# Patient Record
Sex: Female | Born: 1950 | Race: White | Hispanic: No | Marital: Married | Smoking: Never smoker
Health system: Southern US, Community
[De-identification: ages and names within clinical notes are randomized; demographics above are authoritative.]

## PROBLEM LIST (undated history)

## (undated) DIAGNOSIS — C801 Malignant (primary) neoplasm, unspecified: Secondary | ICD-10-CM

## (undated) DIAGNOSIS — T4145XA Adverse effect of unspecified anesthetic, initial encounter: Secondary | ICD-10-CM

## (undated) DIAGNOSIS — F419 Anxiety disorder, unspecified: Secondary | ICD-10-CM

## (undated) DIAGNOSIS — I499 Cardiac arrhythmia, unspecified: Secondary | ICD-10-CM

## (undated) DIAGNOSIS — T8859XA Other complications of anesthesia, initial encounter: Secondary | ICD-10-CM

## (undated) DIAGNOSIS — J189 Pneumonia, unspecified organism: Secondary | ICD-10-CM

## (undated) DIAGNOSIS — Z87442 Personal history of urinary calculi: Secondary | ICD-10-CM

## (undated) DIAGNOSIS — J45909 Unspecified asthma, uncomplicated: Secondary | ICD-10-CM

## (undated) DIAGNOSIS — Z803 Family history of malignant neoplasm of breast: Secondary | ICD-10-CM

## (undated) HISTORY — PX: COLONOSCOPY: SHX174

## (undated) HISTORY — PX: ASD REPAIR, SINUS VENOSUS: SHX1196

## (undated) HISTORY — PX: TUBAL LIGATION: SHX77

## (undated) HISTORY — DX: Family history of malignant neoplasm of breast: Z80.3

## (undated) HISTORY — PX: TONSILLECTOMY: SUR1361

## (undated) HISTORY — PX: TRANSTHORACIC ECHOCARDIOGRAM: SHX275

## (undated) SURGERY — ERCP, WITH INTERVENTION IF INDICATED
Anesthesia: General

---
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
## 2019-11-16 DIAGNOSIS — C801 Malignant (primary) neoplasm, unspecified: Secondary | ICD-10-CM

## 2019-11-16 HISTORY — DX: Malignant (primary) neoplasm, unspecified: C80.1

## 2019-11-30 ENCOUNTER — Encounter: Payer: Self-pay | Admitting: Oncology

## 2019-12-03 ENCOUNTER — Telehealth: Payer: Self-pay | Admitting: Gastroenterology

## 2019-12-03 NOTE — Telephone Encounter (Signed)
Dr Ardis Hughs the records will be put on your desk for review.

## 2019-12-03 NOTE — Telephone Encounter (Signed)
Mary [name] from [facility] called so we can schedule this patient as soon as possible for EUS patient has pancreatic mass records have been received and will be sent to you for review.

## 2019-12-04 NOTE — Telephone Encounter (Signed)
I reviewed a large packet of information from a [place] provider.  Patient has had abdominal pains.  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  CBC was essentially normal, LFTs were not sent, CA 19-9 was extremely elevated at 1349 (usually indicates metastatic process or significant jaundice or both).  Please contact her referring provider and recommend that she have LFTs checked today, ASAP.  I suspect she is going to be jaundiced with elevated liver tests.  Certainly admitting her to the hospital to expedite her care if she is significantly jaundiced is reasonable, I will leave that to her primary team in [place] to decide. That could be done locally I suspect.    Please offer the patient urgent appointment here in our office with any available provider early next week.  From that appointment she can be triaged for ERCP +/- EUS at same time or different time pending availability.  I will leave the large packet of information on my desk.   She will almost certainly need biliary decompression, biliary brushing +/- EUS pending the results of the biliary brushing and availability of EUS.  I am not able to help her for 3 weeks from now.

## 2019-12-04 NOTE — Telephone Encounter (Signed)
I spoke with Cyril Mourning at [facility] and she will have the pt come in for labs today ASAP.  She has been scheduled to see Dr Ardis Hughs on 3/23.  The pt has been advised of the appt information.

## 2019-12-04 NOTE — Telephone Encounter (Signed)
I spoke with the pt and she has been scheduled for 3/23 with Dr Ardis Hughs.  I have tried to call [place] at [phone] and the office is currently closed for lunch.

## 2019-12-07 ENCOUNTER — Other Ambulatory Visit: Payer: Self-pay

## 2019-12-07 DIAGNOSIS — R748 Abnormal levels of other serum enzymes: Secondary | ICD-10-CM

## 2019-12-07 DIAGNOSIS — K8689 Other specified diseases of pancreas: Secondary | ICD-10-CM | POA: Insufficient documentation

## 2019-12-07 DIAGNOSIS — K838 Other specified diseases of biliary tract: Secondary | ICD-10-CM

## 2019-12-07 DIAGNOSIS — E039 Hypothyroidism, unspecified: Secondary | ICD-10-CM

## 2019-12-07 DIAGNOSIS — R140 Abdominal distension (gaseous): Secondary | ICD-10-CM

## 2019-12-07 DIAGNOSIS — Q2116 Sinus venosus atrial septal defect, unspecified: Secondary | ICD-10-CM

## 2019-12-07 DIAGNOSIS — I10 Essential (primary) hypertension: Secondary | ICD-10-CM | POA: Insufficient documentation

## 2019-12-07 DIAGNOSIS — R110 Nausea: Secondary | ICD-10-CM

## 2019-12-07 DIAGNOSIS — Q211 Atrial septal defect: Secondary | ICD-10-CM

## 2019-12-07 DIAGNOSIS — E7800 Pure hypercholesterolemia, unspecified: Secondary | ICD-10-CM

## 2019-12-07 DIAGNOSIS — R634 Abnormal weight loss: Secondary | ICD-10-CM

## 2019-12-07 DIAGNOSIS — R630 Anorexia: Secondary | ICD-10-CM

## 2019-12-07 HISTORY — DX: Hypothyroidism, unspecified: E03.9

## 2019-12-07 HISTORY — DX: Essential (primary) hypertension: I10

## 2019-12-07 HISTORY — DX: Nausea: R11.0

## 2019-12-07 HISTORY — DX: Other specified diseases of biliary tract: K83.8

## 2019-12-07 HISTORY — DX: Atrial septal defect: Q21.1

## 2019-12-07 HISTORY — DX: Other specified diseases of pancreas: K86.89

## 2019-12-07 HISTORY — DX: Pure hypercholesterolemia, unspecified: E78.00

## 2019-12-07 HISTORY — DX: Abnormal weight loss: R63.4

## 2019-12-07 HISTORY — DX: Anorexia: R63.0

## 2019-12-07 HISTORY — DX: Abdominal distension (gaseous): R14.0

## 2019-12-07 HISTORY — DX: Abnormal levels of other serum enzymes: R74.8

## 2019-12-07 HISTORY — DX: Sinus venosus atrial septal defect, unspecified: Q21.16

## 2019-12-08 ENCOUNTER — Encounter: Payer: Self-pay | Admitting: Gastroenterology

## 2019-12-08 ENCOUNTER — Ambulatory Visit (INDEPENDENT_AMBULATORY_CARE_PROVIDER_SITE_OTHER): Payer: Medicare Other | Admitting: Gastroenterology

## 2019-12-08 VITALS — BP 110/70 | HR 105 | Temp 98.3°F | Ht 61.0 in | Wt 125.0 lb

## 2019-12-08 DIAGNOSIS — K838 Other specified diseases of biliary tract: Secondary | ICD-10-CM | POA: Diagnosis not present

## 2019-12-08 DIAGNOSIS — K8689 Other specified diseases of pancreas: Secondary | ICD-10-CM | POA: Diagnosis not present

## 2019-12-08 DIAGNOSIS — R748 Abnormal levels of other serum enzymes: Secondary | ICD-10-CM | POA: Diagnosis not present

## 2019-12-08 NOTE — H&P (View-Only) (Signed)
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  She is here with her husband today.  She had some mild constipation and abdominal discomfort and presented for evaluation her primary care physician.  This eventually led to imaging tests and blood tests which are all summarized below.  Before this she has never been told that she had pancreatic problems.  She has noticed that her stools have gotten a little lighter in her urine has gotten little darker.  She has lost about 10 pounds in the past 2 months.  She really has anorexia, no real pains when she eats however.  She is had no fevers or chills  Old Data Reviewed:  Blood work November 30, 2019 shows creatinine 1.3, BUN 27, CA 19-9 1349, white blood cell count 9, hemoglobin 14.5, platelets 344, lipase 562, amylase normal, celiac sprue serologies were all normal.  TSH was normal (thank you Blood work December 04, 2019 total bilirubin 3.1, direct 2.2, AST 538, ALT 527, alk phos 418  MRI with and without contrast of the abdomen December 02, 2018: Shows an 18 x 17 mm mass within the head of the pancreas that is hypoenhancing.  Pancreatic duct is dilated 5.4 mm.  Tail the pancreas is atrophic.,  Bile duct is dilated up to 11 mm no filling defects in the common bile duct, her gallbladder is essentially normal  CT scan oral contrast November 28, 2018 shows "fullness in the head of the pancreas" dilated main pancreatic duct up to 8 mm.     Review of systems: Pertinent positive and negative review of systems were noted in the above HPI section. All other review negative.   Past Medical History:  Diagnosis Date  . Abdominal distention 12/07/2019  . 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 Atrial septal defect, sinus venosus 12/07/2019  . Common bile duct dilation 12/07/2019  . Dilated pancreatic duct 12/07/2019  . Elevated lipase 12/07/2019   . Essential hypertension 12/07/2019  . Hypothyroidism 12/07/2019  . Kidney stones   . Loss of weight 12/07/2019  . Nausea 12/07/2019  . Pancreatic mass 12/07/2019  . Pure hypercholesterolemia 12/07/2019    Past Surgical History:  Procedure Laterality Date  . ASD REPAIR, SINUS VENOSUS    . TONSILLECTOMY    . TUBAL LIGATION      Current Outpatient Medications  Medication Sig Dispense Refill  . acetaminophen (TYLENOL) 500 MG tablet Take 500 mg by mouth every 6 (six) hours as needed.    Marland Kitchen albuterol (VENTOLIN HFA) 108 (90 Base) MCG/ACT inhaler SMARTSIG:2 Puff(s) By Mouth Every 4 Hours PRN    . aspirin EC 81 MG tablet Take 81 mg by mouth daily.    Marland Kitchen atorvastatin (LIPITOR) 40 MG tablet Take 40 mg by mouth daily.    Marland Kitchen losartan (COZAAR) 50 MG tablet Take 50 mg by mouth daily.    . Omega-3 Fatty Acids (FISH OIL) 1000 MG CAPS Take 1 capsule by mouth daily.     No current facility-administered medications for this visit.    Allergies as of 12/08/2019 - Review Complete 12/08/2019  Allergen Reaction Noted  . Ultram [tramadol] Nausea And Vomiting 12/07/2019  . Penicillins Rash 12/07/2019  . Sulfa antibiotics Rash 12/07/2019    Family History  Problem Relation Age of Onset  . COPD Mother   . Dementia Father   . Diabetes Father   . Hypertension  Father   . Alzheimer's disease Brother     Social History   Socioeconomic History  . Marital status: Married    Spouse name: Not on file  . Number of children: Not on file  . Years of education: Not on file  . Highest education level: Not on file  Occupational History  . Not on file  Tobacco Use  . Smoking status: Never Smoker  . Smokeless tobacco: Never Used  Substance and Sexual Activity  . Alcohol use: Never  . Drug use: Never  . Sexual activity: Not on file  Other Topics Concern  . Not on file  Social History Narrative  . Not on file   Social Determinants of Health   Financial Resource Strain:   . Difficulty of Paying Living  Expenses:   Food Insecurity:   . Worried About Charity fundraiser in the Last Year:   . Arboriculturist in the Last Year:   Transportation Needs:   . Film/video editor (Medical):   Marland Kitchen Lack of Transportation (Non-Medical):   Physical Activity:   . Days of Exercise per Week:   . Minutes of Exercise per Session:   Stress:   . Feeling of Stress :   Social Connections:   . Frequency of Communication with Friends and Family:   . Frequency of Social Gatherings with Friends and Family:   . Attends Religious Services:   . Active Member of Clubs or Organizations:   . Attends Archivist Meetings:   Marland Kitchen Marital Status:   Intimate Partner Violence:   . Fear of Current or Ex-Partner:   . Emotionally Abused:   Marland Kitchen Physically Abused:   . Sexually Abused:      Physical Exam: BP 110/70   Pulse (!) 105   Temp 98.3 F (36.8 C)   Ht 5' 1" (1.549 m)   Wt 125 lb (56.7 kg)   BMI 23.62 kg/m  Constitutional: generally well-appearing Psychiatric: alert and oriented x3 Eyes: extraocular movements intact Mouth: oral pharynx moist, no lesions Neck: supple no lymphadenopathy Cardiovascular: heart regular rate and rhythm Lungs: clear to auscultation bilaterally Abdomen: soft, nontender, nondistended, no obvious ascites, no peritoneal signs, normal bowel sounds Extremities: no lower extremity edema bilaterally Skin: no lesions on visible extremities   Assessment and plan: 69 y.o. 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  They understand that she has a growth in the head of her pancreas that is possibly cancer.  Her total bilirubin was 3.3 and her CA 19-9 was almost 2000 and so I have a suspicion that this might be unresectable.  I recommended endoscopic ultrasound and same time ERCP with her to be done later this week.  She understands her risks to both procedures including  perforation, pancreatitis and bleeding.  We are going to start referral process to medical oncology for pancreatic mass.  I am going to hold on surgery evaluation until we have a better idea about resectability.  She is going to hand carry her CT and MRI on disc with her at the time of her visit later this week.  She understands that she might need further imaging here locally before final staging determination can be done.   Please see the "Patient Instructions" section for addition details about the plan.   Owens Loffler, MD [facility] 12/08/2019, 3:06 PM  Cc: Linus Galas,  NP, [facility] in [place]  Total time on date of encounter was 60  minutes (this included time spent preparing to see the patient reviewing records; obtaining and/or reviewing separately obtained history; performing a medically appropriate exam and/or evaluation; counseling and educating the patient and family if present; ordering medications, tests or procedures if applicable; and documenting clinical information in the health record).

## 2019-12-08 NOTE — Patient Instructions (Addendum)
If you are age 69 or older, your body mass index should be between 23-30. Your Body mass index is 23.62 kg/m. If this is out of the aforementioned range listed, please consider follow up with your Primary Care Provider.  If you are age 13 or younger, your body mass index should be between 19-25. Your Body mass index is 23.62 kg/m. If this is out of the aformentioned range listed, please consider follow up with your Primary Care Provider.   You have been scheduled for an ERCP and upper EUS. Please follow written instructions given to you at your visit today. If you use inhalers (even only as needed), please bring them with you on the day of your procedure.   You will have lab work on the morning of your procedure.  You do not have to fast.  Due to recent changes in healthcare laws, you may see the results of your imaging and laboratory studies on MyChart before your provider has had a chance to review them.  We understand that in some cases there may be results that are confusing or concerning to you. Not all laboratory results come back in the same time frame and the provider may be waiting for multiple results in order to interpret others.  Please give [place] 48 hours in order for your provider to thoroughly review all the results before contacting the office for clarification of your results.   A referral to Oncology has been made. You should expect a call from their office regarding an appointment.   Please bring MRI and CT results on a disk to procedure on Friday.  Thank you for entrusting me with your care and choosing [facility].  Dr Ardis Hughs

## 2019-12-08 NOTE — Progress Notes (Signed)
HPI: This is a Beola Cord, Designer, jewellery at [facility] in [place]  Very pleasant 69 year old woman whom I am meeting for the first time today.  She is here with her husband today.  She had some mild constipation and abdominal discomfort and presented for evaluation her primary care physician.  This eventually led to imaging tests and blood tests which are all summarized below.  Before this she has never been told that she had pancreatic problems.  She has noticed that her stools have gotten a little lighter in her urine has gotten little darker.  She has lost about 10 pounds in the past 2 months.  She really has anorexia, no real pains when she eats however.  She is had no fevers or chills  Old Data Reviewed:  Blood work November 30, 2019 shows creatinine 1.3, BUN 27, CA 19-9 1349, white blood cell count 9, hemoglobin 14.5, platelets 344, lipase 562, amylase normal, celiac sprue serologies were all normal.  TSH was normal (thank you Blood work December 04, 2019 total bilirubin 3.1, direct 2.2, AST 538, ALT 527, alk phos 418  MRI with and without contrast of the abdomen December 02, 2018: Shows an 18 x 17 mm mass within the head of the pancreas that is hypoenhancing.  Pancreatic duct is dilated 5.4 mm.  Tail the pancreas is atrophic.,  Bile duct is dilated up to 11 mm no filling defects in the common bile duct, her gallbladder is essentially normal  CT scan oral contrast November 28, 2018 shows "fullness in the head of the pancreas" dilated main pancreatic duct up to 8 mm.     Review of systems: Pertinent positive and negative review of systems were noted in the above HPI section. All other review negative.   Past Medical History:  Diagnosis Date  . Abdominal distention 12/07/2019  . 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 Atrial septal defect, sinus venosus 12/07/2019  . Common bile duct dilation 12/07/2019  . Dilated pancreatic duct 12/07/2019  . Elevated lipase 12/07/2019   . Essential hypertension 12/07/2019  . Hypothyroidism 12/07/2019  . Kidney stones   . Loss of weight 12/07/2019  . Nausea 12/07/2019  . Pancreatic mass 12/07/2019  . Pure hypercholesterolemia 12/07/2019    Past Surgical History:  Procedure Laterality Date  . ASD REPAIR, SINUS VENOSUS    . TONSILLECTOMY    . TUBAL LIGATION      Current Outpatient Medications  Medication Sig Dispense Refill  . acetaminophen (TYLENOL) 500 MG tablet Take 500 mg by mouth every 6 (six) hours as needed.    Marland Kitchen albuterol (VENTOLIN HFA) 108 (90 Base) MCG/ACT inhaler SMARTSIG:2 Puff(s) By Mouth Every 4 Hours PRN    . aspirin EC 81 MG tablet Take 81 mg by mouth daily.    Marland Kitchen atorvastatin (LIPITOR) 40 MG tablet Take 40 mg by mouth daily.    Marland Kitchen losartan (COZAAR) 50 MG tablet Take 50 mg by mouth daily.    . Omega-3 Fatty Acids (FISH OIL) 1000 MG CAPS Take 1 capsule by mouth daily.     No current facility-administered medications for this visit.    Allergies as of 12/08/2019 - Review Complete 12/08/2019  Allergen Reaction Noted  . Ultram [tramadol] Nausea And Vomiting 12/07/2019  . Penicillins Rash 12/07/2019  . Sulfa antibiotics Rash 12/07/2019    Family History  Problem Relation Age of Onset  . COPD Mother   . Dementia Father   . Diabetes Father   . Hypertension  Father   . Alzheimer's disease Brother     Social History   Socioeconomic History  . Marital status: Married    Spouse name: Not on file  . Number of children: Not on file  . Years of education: Not on file  . Highest education level: Not on file  Occupational History  . Not on file  Tobacco Use  . Smoking status: Never Smoker  . Smokeless tobacco: Never Used  Substance and Sexual Activity  . Alcohol use: Never  . Drug use: Never  . Sexual activity: Not on file  Other Topics Concern  . Not on file  Social History Narrative  . Not on file   Social Determinants of Health   Financial Resource Strain:   . Difficulty of Paying Living  Expenses:   Food Insecurity:   . Worried About Charity fundraiser in the Last Year:   . Arboriculturist in the Last Year:   Transportation Needs:   . Film/video editor (Medical):   Marland Kitchen Lack of Transportation (Non-Medical):   Physical Activity:   . Days of Exercise per Week:   . Minutes of Exercise per Session:   Stress:   . Feeling of Stress :   Social Connections:   . Frequency of Communication with Friends and Family:   . Frequency of Social Gatherings with Friends and Family:   . Attends Religious Services:   . Active Member of Clubs or Organizations:   . Attends Archivist Meetings:   Marland Kitchen Marital Status:   Intimate Partner Violence:   . Fear of Current or Ex-Partner:   . Emotionally Abused:   Marland Kitchen Physically Abused:   . Sexually Abused:      Physical Exam: BP 110/70   Pulse (!) 105   Temp 98.3 F (36.8 C)   Ht 5' 1" (1.549 m)   Wt 125 lb (56.7 kg)   BMI 23.62 kg/m  Constitutional: generally well-appearing Psychiatric: alert and oriented x3 Eyes: extraocular movements intact Mouth: oral pharynx moist, no lesions Neck: supple no lymphadenopathy Cardiovascular: heart regular rate and rhythm Lungs: clear to auscultation bilaterally Abdomen: soft, nontender, nondistended, no obvious ascites, no peritoneal signs, normal bowel sounds Extremities: no lower extremity edema bilaterally Skin: no lesions on visible extremities   Assessment and plan: 69 y.o. 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  They understand that she has a growth in the head of her pancreas that is possibly cancer.  Her total bilirubin was 3.3 and her CA 19-9 was almost 2000 and so I have a suspicion that this might be unresectable.  I recommended endoscopic ultrasound and same time ERCP with her to be done later this week.  She understands her risks to both procedures including  perforation, pancreatitis and bleeding.  We are going to start referral process to medical oncology for pancreatic mass.  I am going to hold on surgery evaluation until we have a better idea about resectability.  She is going to hand carry her CT and MRI on disc with her at the time of her visit later this week.  She understands that she might need further imaging here locally before final staging determination can be done.   Please see the "Patient Instructions" section for addition details about the plan.   Owens Loffler, MD [facility] 12/08/2019, 3:06 PM  Cc: Linus Galas,  NP, [facility] in [place]  Total time on date of encounter was 60  minutes (this included time spent preparing to see the patient reviewing records; obtaining and/or reviewing separately obtained history; performing a medically appropriate exam and/or evaluation; counseling and educating the patient and family if present; ordering medications, tests or procedures if applicable; and documenting clinical information in the health record).

## 2019-12-09 ENCOUNTER — Other Ambulatory Visit (HOSPITAL_COMMUNITY)
Admission: RE | Admit: 2019-12-09 | Discharge: 2019-12-09 | Disposition: A | Discharge status: Home / Self Care | Payer: Medicare Other | Source: Ambulatory Visit | Attending: Gastroenterology | Admitting: Gastroenterology

## 2019-12-09 DIAGNOSIS — K8689 Other specified diseases of pancreas: Secondary | ICD-10-CM | POA: Diagnosis present

## 2019-12-09 DIAGNOSIS — Z7982 Long term (current) use of aspirin: Secondary | ICD-10-CM | POA: Diagnosis not present

## 2019-12-09 DIAGNOSIS — C250 Malignant neoplasm of head of pancreas: Secondary | ICD-10-CM | POA: Diagnosis not present

## 2019-12-09 DIAGNOSIS — E7800 Pure hypercholesterolemia, unspecified: Secondary | ICD-10-CM | POA: Diagnosis not present

## 2019-12-09 DIAGNOSIS — I10 Essential (primary) hypertension: Secondary | ICD-10-CM | POA: Diagnosis not present

## 2019-12-09 DIAGNOSIS — Z20822 Contact with and (suspected) exposure to covid-19: Secondary | ICD-10-CM | POA: Diagnosis not present

## 2019-12-09 DIAGNOSIS — Z79899 Other long term (current) drug therapy: Secondary | ICD-10-CM | POA: Diagnosis not present

## 2019-12-09 DIAGNOSIS — K831 Obstruction of bile duct: Secondary | ICD-10-CM | POA: Diagnosis not present

## 2019-12-09 LAB — SARS CORONAVIRUS 2 (TAT 6-24 HRS): SARS Coronavirus 2: NEGATIVE

## 2019-12-10 ENCOUNTER — Encounter (HOSPITAL_COMMUNITY): Payer: Self-pay | Admitting: Gastroenterology

## 2019-12-10 ENCOUNTER — Other Ambulatory Visit: Payer: Self-pay

## 2019-12-10 ENCOUNTER — Telehealth: Payer: Self-pay | Admitting: Nurse Practitioner

## 2019-12-10 NOTE — Progress Notes (Signed)
Spoke with pt for pre-op call. Pt has hx of ASD with surgery done when pt was 69 years old. Pt has recently seen Dr. Raechel Chute at [facility] in [place] for palpitations. She states he has done an EKG and Echo. I have requested those 2 items to be faxed to [place]. Pt denies any recent chest pain.  Covid test done on 12/09/19 and it's negative. Pt states she's been in quarantine since the test was done and understands that she needs to continue to be in quarantine until she comes to the hospital tomorrow.  Asked pt to hold her Melatonin tonight. She states Dr. Edison Nasuti told her that she could continue her 81 mg Aspirin.

## 2019-12-10 NOTE — Telephone Encounter (Signed)
Received a new pt referral from Dr. Ardis Hughs at [facility] for pancreatic mass. Mary [name] has been cld and schedule to see Mary [name] and Mary [name] on 3/30 at 11:45am. Pt aware to arrive 15 minutes early.

## 2019-12-11 ENCOUNTER — Encounter (HOSPITAL_COMMUNITY)
Admission: RE | Disposition: A | Discharge status: Home / Self Care | Payer: Self-pay | Source: Home / Self Care | Attending: Gastroenterology

## 2019-12-11 ENCOUNTER — Ambulatory Visit (HOSPITAL_COMMUNITY): Payer: Medicare Other

## 2019-12-11 ENCOUNTER — Ambulatory Visit (HOSPITAL_COMMUNITY)
Admission: RE | Admit: 2019-12-11 | Discharge: 2019-12-11 | Disposition: A | Discharge status: Home / Self Care | Payer: Medicare Other | Attending: Gastroenterology | Admitting: Gastroenterology

## 2019-12-11 ENCOUNTER — Ambulatory Visit: Payer: Self-pay

## 2019-12-11 ENCOUNTER — Other Ambulatory Visit: Payer: Self-pay

## 2019-12-11 ENCOUNTER — Encounter (HOSPITAL_COMMUNITY): Payer: Self-pay | Admitting: Gastroenterology

## 2019-12-11 ENCOUNTER — Ambulatory Visit (HOSPITAL_COMMUNITY): Payer: Medicare Other | Admitting: Certified Registered Nurse Anesthetist

## 2019-12-11 DIAGNOSIS — Z79899 Other long term (current) drug therapy: Secondary | ICD-10-CM | POA: Insufficient documentation

## 2019-12-11 DIAGNOSIS — K8689 Other specified diseases of pancreas: Secondary | ICD-10-CM | POA: Diagnosis not present

## 2019-12-11 DIAGNOSIS — K838 Other specified diseases of biliary tract: Secondary | ICD-10-CM

## 2019-12-11 DIAGNOSIS — C250 Malignant neoplasm of head of pancreas: Secondary | ICD-10-CM | POA: Diagnosis not present

## 2019-12-11 DIAGNOSIS — R748 Abnormal levels of other serum enzymes: Secondary | ICD-10-CM

## 2019-12-11 DIAGNOSIS — Z7982 Long term (current) use of aspirin: Secondary | ICD-10-CM | POA: Insufficient documentation

## 2019-12-11 DIAGNOSIS — R52 Pain, unspecified: Secondary | ICD-10-CM

## 2019-12-11 DIAGNOSIS — K831 Obstruction of bile duct: Secondary | ICD-10-CM | POA: Insufficient documentation

## 2019-12-11 DIAGNOSIS — Z20822 Contact with and (suspected) exposure to covid-19: Secondary | ICD-10-CM | POA: Insufficient documentation

## 2019-12-11 DIAGNOSIS — I10 Essential (primary) hypertension: Secondary | ICD-10-CM | POA: Diagnosis not present

## 2019-12-11 DIAGNOSIS — E7800 Pure hypercholesterolemia, unspecified: Secondary | ICD-10-CM | POA: Insufficient documentation

## 2019-12-11 HISTORY — PX: ESOPHAGOGASTRODUODENOSCOPY (EGD) WITH PROPOFOL: SHX5813

## 2019-12-11 HISTORY — DX: Pneumonia, unspecified organism: J18.9

## 2019-12-11 HISTORY — DX: Cardiac arrhythmia, unspecified: I49.9

## 2019-12-11 HISTORY — PX: BILIARY STENT PLACEMENT: SHX5538

## 2019-12-11 HISTORY — PX: UPPER ESOPHAGEAL ENDOSCOPIC ULTRASOUND (EUS): SHX6562

## 2019-12-11 HISTORY — DX: Unspecified asthma, uncomplicated: J45.909

## 2019-12-11 HISTORY — DX: Personal history of urinary calculi: Z87.442

## 2019-12-11 HISTORY — PX: FINE NEEDLE ASPIRATION: SHX5430

## 2019-12-11 HISTORY — PX: ENDOSCOPIC RETROGRADE CHOLANGIOPANCREATOGRAPHY (ERCP) WITH PROPOFOL: SHX5810

## 2019-12-11 HISTORY — DX: Other complications of anesthesia, initial encounter: T88.59XA

## 2019-12-11 LAB — COMPREHENSIVE METABOLIC PANEL
ALT: 540 U/L — ABNORMAL HIGH (ref 0–44)
AST: 622 U/L — ABNORMAL HIGH (ref 15–41)
Albumin: 3.7 g/dL (ref 3.5–5.0)
Alkaline Phosphatase: 540 U/L — ABNORMAL HIGH (ref 38–126)
Anion gap: 11 (ref 5–15)
BUN: 23 mg/dL (ref 8–23)
CO2: 24 mmol/L (ref 22–32)
Calcium: 9.8 mg/dL (ref 8.9–10.3)
Chloride: 107 mmol/L (ref 98–111)
Creatinine, Ser: 1.77 mg/dL — ABNORMAL HIGH (ref 0.44–1.00)
GFR calc Af Amer: 34 mL/min — ABNORMAL LOW (ref >60)
GFR calc non Af Amer: 29 mL/min — ABNORMAL LOW (ref >60)
Glucose, Bld: 125 mg/dL — ABNORMAL HIGH (ref 70–99)
Potassium: 4.6 mmol/L (ref 3.5–5.1)
Sodium: 142 mmol/L (ref 135–145)
Total Bilirubin: 6.4 mg/dL — ABNORMAL HIGH (ref 0.3–1.2)
Total Protein: 7.3 g/dL (ref 6.5–8.1)

## 2019-12-11 SURGERY — ENDOSCOPIC RETROGRADE CHOLANGIOPANCREATOGRAPHY (ERCP) WITH PROPOFOL
Anesthesia: General

## 2019-12-11 MED ORDER — PROPOFOL 10 MG/ML IV BOLUS
INTRAVENOUS | Status: DC | PRN
  Administered 2019-12-11: 100 mg via INTRAVENOUS

## 2019-12-11 MED ORDER — SODIUM CHLORIDE 0.9 % IV SOLN: INTRAVENOUS | Status: DC

## 2019-12-11 MED ORDER — ONDANSETRON HCL 4 MG/2ML IJ SOLN
INTRAMUSCULAR | Status: DC | PRN
  Administered 2019-12-11: 4 mg via INTRAVENOUS

## 2019-12-11 MED ORDER — LIDOCAINE 2% (20 MG/ML) 5 ML SYRINGE
INTRAMUSCULAR | Status: DC | PRN
  Administered 2019-12-11: 100 mg via INTRAVENOUS

## 2019-12-11 MED ORDER — ROCURONIUM BROMIDE 10 MG/ML (PF) SYRINGE
PREFILLED_SYRINGE | INTRAVENOUS | Status: DC | PRN
  Administered 2019-12-11 (×2): 30 mg via INTRAVENOUS

## 2019-12-11 MED ORDER — FENTANYL CITRATE (PF) 100 MCG/2ML IJ SOLN
25.0000 ug | INTRAMUSCULAR | Status: DC | PRN
  Administered 2019-12-11: 13:00:00 25 ug via INTRAVENOUS

## 2019-12-11 MED ORDER — INDOMETHACIN 50 MG RE SUPP
RECTAL | Status: AC
  Filled 2019-12-11: qty 2

## 2019-12-11 MED ORDER — INDOMETHACIN 50 MG RE SUPP
RECTAL | Status: DC | PRN
  Administered 2019-12-11: 100 mg via RECTAL

## 2019-12-11 MED ORDER — LACTATED RINGERS IV SOLN: INTRAVENOUS | Status: DC

## 2019-12-11 MED ORDER — SODIUM CHLORIDE 0.9 % IV SOLN
INTRAVENOUS | Status: DC | PRN
  Administered 2019-12-11: 20 mL

## 2019-12-11 MED ORDER — CIPROFLOXACIN IN D5W 400 MG/200ML IV SOLN
INTRAVENOUS | Status: AC
  Filled 2019-12-11: qty 200

## 2019-12-11 MED ORDER — GLUCAGON HCL RDNA (DIAGNOSTIC) 1 MG IJ SOLR
INTRAMUSCULAR | Status: AC
  Filled 2019-12-11: qty 1

## 2019-12-11 MED ORDER — ONDANSETRON HCL 4 MG/2ML IJ SOLN: 4.0000 mg | Freq: Once | INTRAMUSCULAR | Status: DC | PRN

## 2019-12-11 MED ORDER — GLUCAGON HCL RDNA (DIAGNOSTIC) 1 MG IJ SOLR
INTRAMUSCULAR | Status: DC | PRN
  Administered 2019-12-11: .5 mg via INTRAVENOUS

## 2019-12-11 MED ORDER — PHENYLEPHRINE 40 MCG/ML (10ML) SYRINGE FOR IV PUSH (FOR BLOOD PRESSURE SUPPORT)
PREFILLED_SYRINGE | INTRAVENOUS | Status: DC | PRN
  Administered 2019-12-11 (×2): 160 ug via INTRAVENOUS
  Administered 2019-12-11 (×2): 120 ug via INTRAVENOUS

## 2019-12-11 MED ORDER — FENTANYL CITRATE (PF) 250 MCG/5ML IJ SOLN
INTRAMUSCULAR | Status: DC | PRN
  Administered 2019-12-11: 100 ug via INTRAVENOUS

## 2019-12-11 MED ORDER — DEXAMETHASONE SODIUM PHOSPHATE 10 MG/ML IJ SOLN
INTRAMUSCULAR | Status: DC | PRN
  Administered 2019-12-11: 4 mg via INTRAVENOUS

## 2019-12-11 MED ORDER — PHENYLEPHRINE HCL-NACL 10-0.9 MG/250ML-% IV SOLN
INTRAVENOUS | Status: DC | PRN
  Administered 2019-12-11: 25 ug/min via INTRAVENOUS

## 2019-12-11 MED ORDER — FENTANYL CITRATE (PF) 100 MCG/2ML IJ SOLN
INTRAMUSCULAR | Status: AC
  Filled 2019-12-11: qty 2

## 2019-12-11 MED ORDER — SUGAMMADEX SODIUM 200 MG/2ML IV SOLN
INTRAVENOUS | Status: DC | PRN
  Administered 2019-12-11: 180 mg via INTRAVENOUS

## 2019-12-11 MED ORDER — MEPERIDINE HCL 100 MG/ML IJ SOLN: 6.2500 mg | INTRAMUSCULAR | Status: DC | PRN

## 2019-12-11 SURGICAL SUPPLY — 14 items

## 2019-12-11 NOTE — Anesthesia Procedure Notes (Signed)
Procedure Name: Intubation Date/Time: 12/11/2019 9:56 AM Performed by: Bryson Corona, CRNA Pre-anesthesia Checklist: Patient identified, Emergency Drugs available, Suction available and Patient being monitored Patient Re-evaluated:Patient Re-evaluated prior to induction Oxygen Delivery Method: Circle System Utilized Preoxygenation: Pre-oxygenation with 100% oxygen Induction Type: IV induction Ventilation: Mask ventilation without difficulty Laryngoscope Size: Mac and 3 Grade View: Grade II Tube type: Oral Tube size: 7.0 mm Number of attempts: 1 Airway Equipment and Method: Stylet Placement Confirmation: ETT inserted through vocal cords under direct vision,  positive ETCO2 and breath sounds checked- equal and bilateral Secured at: 22 cm Tube secured with: Tape Dental Injury: Teeth and Oropharynx as per pre-operative assessment

## 2019-12-11 NOTE — Discharge Instructions (Signed)
YOU HAD AN ENDOSCOPIC PROCEDURE TODAY: Refer to the procedure report and other information in the discharge instructions given to you for any specific questions about what was found during the examination. If this information does not answer your questions, please call [place] office at [phone] to clarify.   YOU SHOULD EXPECT: Some feelings of bloating in the abdomen. Passage of more gas than usual. Walking can help get rid of the air that was put into your GI tract during the procedure and reduce the bloating. If you had a lower endoscopy (such as a colonoscopy or flexible sigmoidoscopy) you may notice spotting of blood in your stool or on the toilet paper. Some abdominal soreness may be present for a day or two, also.  DIET: Your first meal following the procedure should be a light meal and then it is ok to progress to your normal diet. A half-sandwich or bowl of soup is an example of a good first meal. Heavy or fried foods are harder to digest and may make you feel nauseous or bloated. Drink plenty of fluids but you should avoid alcoholic beverages for 24 hours. If you had a esophageal dilation, please see attached instructions for diet.    ACTIVITY: Your care partner should take you home directly after the procedure. You should plan to take it easy, moving slowly for the rest of the day. You can resume normal activity the day after the procedure however YOU SHOULD NOT DRIVE, use power tools, machinery or perform tasks that involve climbing or major physical exertion for 24 hours (because of the sedation medicines used during the test).   SYMPTOMS TO REPORT IMMEDIATELY: A gastroenterologist can be reached at any hour. Please call [phone]  for any of the following symptoms:   Following upper endoscopy (EGD, EUS, ERCP, esophageal dilation) Vomiting of blood or coffee ground material  New, significant abdominal pain  New, significant chest pain or pain under the shoulder blades  Painful or  persistently difficult swallowing  New shortness of breath  Black, tarry-looking or red, bloody stools  FOLLOW UP:  If any biopsies were taken you will be contacted by phone or by letter within the next 1-3 weeks. Call [phone]  if you have not heard about the biopsies in 3 weeks.  Please also call with any specific questions about appointments or follow up tests.  

## 2019-12-11 NOTE — Anesthesia Preprocedure Evaluation (Signed)
Anesthesia Evaluation  Patient identified by MRN, date of birth, ID band Patient awake    Reviewed: Allergy & Precautions, NPO status , Patient's Chart, lab work & pertinent test results  Airway Mallampati: II  TM Distance: >3 FB Neck ROM: Full    Dental no notable dental hx. (+) Dental Advisory Given   Pulmonary asthma , pneumonia,    Pulmonary exam normal breath sounds clear to auscultation       Cardiovascular hypertension, Pt. on medications Normal cardiovascular exam Rhythm:Regular Rate:Normal     Neuro/Psych negative neurological ROS  negative psych ROS   GI/Hepatic negative GI ROS, Neg liver ROS,   Endo/Other  Hypothyroidism   Renal/GU negative Renal ROS     Musculoskeletal negative musculoskeletal ROS (+)   Abdominal   Peds  Hematology negative hematology ROS (+)   Anesthesia Other Findings   Reproductive/Obstetrics negative OB ROS                             Anesthesia Physical Anesthesia Plan  ASA: III  Anesthesia Plan: General   Post-op Pain Management:    Induction: Intravenous  PONV Risk Score and Plan: 3 and Ondansetron, Dexamethasone and Treatment may vary due to age or medical condition  Airway Management Planned: Oral ETT  Additional Equipment: None  Intra-op Plan:   Post-operative Plan: Extubation in OR  Informed Consent: I have reviewed the patients History and Physical, chart, labs and discussed the procedure including the risks, benefits and alternatives for the proposed anesthesia with the patient or authorized representative who has indicated his/her understanding and acceptance.     Dental advisory given  Plan Discussed with: CRNA  Anesthesia Plan Comments:         Anesthesia Quick Evaluation

## 2019-12-11 NOTE — Op Note (Signed)
[facility] Patient Name: Mary [name] Procedure Date : 12/11/2019 MRN: [id number] Attending MD: [name] , MD Date of Birth: 10-Jul-1951 CSN: [id number] Age: 69 Admit Type: Inpatient Procedure:                Upper EUS Indications:              relatively painless jaundice, weight loss, outside                            [facility]) CT and MR show mass in head of pancreas,                            TBili 6.4 this morning Providers:                [name], MD, Elmer Ramp. Tilden Dome, RN, Lina Sar, Malachi Carl CRNA Referring MD:              Medicines:                General Anesthesia Complications:            No immediate complications. Estimated blood loss:                            None. Estimated Blood Loss:     Estimated blood loss: none. Procedure:                Pre-Anesthesia Assessment:                           - Prior to the procedure, a History and Physical                            was performed, and patient medications and                            allergies were reviewed. The patient's tolerance of                            previous anesthesia was also reviewed. The risks                            and benefits of the procedure and the sedation                            options and risks were discussed with the patient.                            All questions were answered, and informed consent                            was obtained. Prior Anticoagulants: The patient has  taken no previous anticoagulant or antiplatelet                            agents. ASA Grade Assessment: II - A patient with                            mild systemic disease. After reviewing the risks                            and benefits, the patient was deemed in                            satisfactory condition to undergo the procedure.                           After obtaining informed consent,  the endoscope was                            passed under direct vision. Throughout the                            procedure, the patient's blood pressure, pulse, and                            oxygen saturations were monitored continuously. The                            [id number]) Olympus Radial EUS scope was                            introduced through the mouth, and advanced to the                            second part of duodenum. The upper EUS was                            accomplished without difficulty. The patient                            tolerated the procedure well. Scope In: Scope Out: Findings:      ENDOSCOPIC FINDING (limited visualization with radial and linear EUS       scopes): :      The examined esophagus was endoscopically normal.      The entire examined stomach was endoscopically normal.      The examined duodenum was endoscopically normal.      ENDOSONOGRAPHIC FINDING: :      1. Irregularly shaped, hypoechoic, heterogeneous 3.4cm mass involving       the pancreatic head that has very irregular outer borders. The mass       clearly invades a 1.5cm segment of the portal vein and it is causing       pancreatic and bile duct obstruction with upstream dilation (main PD       dilated to 69mm in body and tail). The mass was sampled with three  transduodenal passes with a 25gauge EUS FNA needle.      2. No peripancreatic adenopathy.      3. The extrahepatic bile duct is dilated (CBD 1.3cm) and obstructed by       the pancreatic mass above.      4. Gallbladder was distended and contained sludge.      5. Limited views of the liver, spleen were all normal. Impression:               - 3.4cm irregularly shaped mass in the head of                            pancreas causing obstruction of the main pancreatic                            duct and extrahepatic bile duct and clearly                            invading into the portal vein for about 1.5cm. 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 Recommendation:           - ERCP now. Procedure Code(s):        --- Professional ---                           [phone], Esophagogastroduodenoscopy, flexible,                            transoral; with transendoscopic ultrasound-guided                            intramural or transmural fine needle                            aspiration/biopsy(s), (includes endoscopic                            ultrasound examination limited to the esophagus,                            stomach or duodenum, and adjacent structures) Diagnosis Code(s):        --- Professional ---                           K86.89, Other specified diseases of pancreas                           R93.3, Abnormal findings on diagnostic imaging of  other parts of digestive tract CPT copyright 2019 American [employer] Association. All rights reserved. The codes documented in this report are preliminary and upon coder review may  be revised to meet current compliance requirements. [name], MD 12/11/2019 10:57:26 AM This report has been signed electronically. Number of Addenda: 0

## 2019-12-11 NOTE — Op Note (Signed)
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 Tilden Dome, RN, Lina Sar, Malachi Carl CRNA Referring MD:              Medicines:                Monitored Anesthesia Care, Indomethacin 123XX123 mg PR Complications:            No immediate complications. Estimated blood loss:                            None Estimated Blood Loss:     Estimated blood loss: none. Procedure:                Pre-Anesthesia Assessment:                           - Prior to the procedure, a History and Physical                            was performed, and patient medications and                            allergies were reviewed. The patient's tolerance of                            previous anesthesia was also reviewed. The risks                            and benefits of the procedure and the sedation                            options and risks were discussed with the patient.                            All questions were answered, and informed consent                            was obtained. Prior Anticoagulants: The patient has                            taken no previous anticoagulant or antiplatelet  agents. ASA Grade Assessment: II - A patient with                            mild systemic disease. After reviewing the risks                            and benefits, the patient was deemed in                            satisfactory condition to undergo the procedure.                           After obtaining informed consent, the scope was                            passed  under direct vision. Throughout the                            procedure, the patient's blood pressure, pulse, and                            oxygen saturations were monitored continuously. The                            [id number]) Olympus Duodensocope was                            introduced through the mouth, and used to inject                            contrast into and used to inject contrast into the                            bile duct. The ERCP was accomplished without                            difficulty. The patient tolerated the procedure                            well. Scope In: Scope Out: Findings:      The scout film was normal. The esophagus was successfully intubated       under direct vision. The scope was advanced to a normal major papilla in       the descending duodenum without detailed examination of the pharynx,       larynx and associated structures, and upper GI tract. There was a small       periampullary diverticulum which did not complicate biliary cannulation.       A 39 Autotome over a .025jagwire was used to cannulate the bile duct and       contrast was injected. A .035hydrajag wire was temporarily placed into       the main pancreatic duct to facilitate biliary cannulation.       Cholangiogram revealed a 1.5cm long mid bile duct stricture. The distal  1.5 to 2cm of the CBD was normal, non-dilated. The bile duct proximal to       the stricture was dilated (to 1.3cm). The right and left hepatic ducts       were slightly dilated. Further intrahepatic ducts were not evaluated.       The cystic duct did not opacify. A 18mm diameter, 6cm long uncovered       metal mesh stent was placed in good position across the mid bile duct       stricture. The distal most 1cm of the stent extended across the major       papilla into the duodenum. There was prompt delivery of old, dark bile       through the biliary stent after placement. The main pancreatic  duct was       cannulated with the wire but never injected with dye. Impression:               - Malignant mid bile duct stricture, treated with                            placement of a 6cm long 64mm diameter uncovered                            metal biliary stent. Recommendation:           - Discharge patient to home.                           - She already has an appointement with medical                            oncology next week. Outside CT and MR have been                            uploaded to PACS. Procedure Code(s):        --- Professional ---                           [phone], Endoscopic retrograde                            cholangiopancreatography (ERCP); with placement of                            endoscopic stent into biliary or pancreatic duct,                            including pre- and post-dilation and guide wire                            passage, when performed, including sphincterotomy,                            when performed, each stent Diagnosis Code(s):        --- Professional ---                           K83.1,  Obstruction of bile duct CPT copyright 2019 American Medical Association. All rights reserved. The codes documented in this report are preliminary and upon coder review may  be revised to meet current compliance requirements. [name], MD 12/11/2019 11:58:56 AM This report has been signed electronically. Number of Addenda: 0

## 2019-12-11 NOTE — Anesthesia Postprocedure Evaluation (Signed)
Anesthesia Post Note  [name]  Procedure(s) Performed: ENDOSCOPIC RETROGRADE CHOLANGIOPANCREATOGRAPHY (ERCP) WITH PROPOFOL (N/A ) ESOPHAGOGASTRODUODENOSCOPY (EGD) WITH PROPOFOL (N/A ) UPPER ESOPHAGEAL ENDOSCOPIC ULTRASOUND (EUS) (N/A ) FINE NEEDLE ASPIRATION (FNA) [place] PLACEMENT     Patient location during evaluation: PACU Anesthesia Type: General Level of consciousness: sedated and patient cooperative Pain management: pain level controlled Vital Signs Assessment: post-procedure vital signs reviewed and stable Respiratory status: spontaneous breathing Cardiovascular status: stable Anesthetic complications: no Comments: Some chest discomfort post procedure. Denies nausea, pain in jaw or radiating down arm or elsewhere. EKG showed NSR. Suspect gas or due to esophageal trauma from EGD/ERCP. Pt desired to be d/c'd home.    Last Vitals:  Vitals:   12/11/19 1300 12/11/19 1310  BP: (!) 158/67 (!) 160/62  Pulse: 84 82  Resp: 18 16  Temp:    SpO2: 95% 95%    Last Pain:  Vitals:   12/11/19 1310  TempSrc:   PainSc: 2                  Nolon Nations

## 2019-12-11 NOTE — Interval H&P Note (Signed)
History and Physical Interval Note:  12/11/2019 8:44 AM  Mary [name]  has presented today for surgery, with the diagnosis of PANCREATIC MASS.  The various methods of treatment have been discussed with the patient and family. After consideration of risks, benefits and other options for treatment, the patient has consented to  Procedure(s): ENDOSCOPIC RETROGRADE CHOLANGIOPANCREATOGRAPHY (ERCP) WITH PROPOFOL (N/A) ESOPHAGOGASTRODUODENOSCOPY (EGD) WITH PROPOFOL (N/A) UPPER ESOPHAGEAL ENDOSCOPIC ULTRASOUND (EUS) (N/A) as a surgical intervention.  The patient's history has been reviewed, patient examined, no change in status, stable for surgery.  I have reviewed the patient's chart and labs.  Questions were answered to the patient's satisfaction.     [name]

## 2019-12-11 NOTE — Transfer of Care (Signed)
Immediate Anesthesia Transfer of Care Note  [name]  Procedure(s) Performed: ENDOSCOPIC RETROGRADE CHOLANGIOPANCREATOGRAPHY (ERCP) WITH PROPOFOL (N/A ) ESOPHAGOGASTRODUODENOSCOPY (EGD) WITH PROPOFOL (N/A ) UPPER ESOPHAGEAL ENDOSCOPIC ULTRASOUND (EUS) (N/A ) FINE NEEDLE ASPIRATION (FNA) LINEAR BILIARY STENT PLACEMENT  Patient Location: Endoscopy Unit  Anesthesia Type:General  Level of Consciousness: awake, alert  and sedated  Airway & Oxygen Therapy: Patient Spontanous Breathing  Post-op Assessment: Post -op Vital signs reviewed and stable  Post vital signs: stable  Last Vitals:  Vitals Value Taken Time  BP    Temp    Pulse    Resp    SpO2      Last Pain:  Vitals:   12/11/19 0802  TempSrc: Oral  PainSc: 5          Complications: No apparent anesthesia complications

## 2019-12-11 NOTE — Progress Notes (Signed)
Pt began complaining of chest pain 5/10 with heavy feeling around 1245 following ERCP.  Dr. Ardis Hughs and Dr. Lissa Hoard made aware.  EKG obtained showing normal sinus rhythm.  34mcg iv fentanyl given with decrease to 2/10 pain.  Pt wanting to go home, able to walk easily to restroom with some resolution of pain.  Per Dr. Lissa Hoard, ok to discharge pt home.  Pt discharged home with husband.  Vista Lawman, RN

## 2019-12-14 ENCOUNTER — Other Ambulatory Visit: Payer: Self-pay

## 2019-12-14 LAB — CYTOLOGY - NON PAP

## 2019-12-14 NOTE — Progress Notes (Signed)
Send scheduling message for nutritionist.

## 2019-12-14 NOTE — Progress Notes (Addendum)
[place]  [phone] Fax:([phone] [place] Note   Patient Care Team: System, Provider Not In as PCP - General Jonnie Finner, RN as Oncology Nurse Navigator Date of Service: 12/15/2019   CHIEF COMPLAINTS/PURPOSE OF CONSULTATION:  Pancreas cancer, referred by GI Dr. Ardis Hughs  HISTORY OF PRESENTING ILLNESS:  Mary [name] 69 y.o. female with history of HTN, HL, hypothyroidism, and ASD repair at age 41 is here because of newly diagnosed pancreas cancer. She developed anorexia with 15 lbs weight loss, abdominal pain, constipation, and clinical signs of juandice about 2 months ago. CT AP wo IV contrast on 11/30/19 at [facility] in [place], [place] showed fullness in the pancreatic head. Follow up MRI of the abdomen on 12/02/19 showed a 18 x17 mm mass in the head of the pancreas with pancreatic duct and CBD dilatation. There are small cysts noted in the right liver lobe, otherwise no evidence of hepatic mets or other metastatic disease in the abdomen.  The images were not available at the time of this consult. Labs on 12/04/19 at PCP showed Bili 3.3, ALT 538 AST 527 and ALK PHOS 414. CA 19-9 markedly elevated to 1349. On 12/11/19 Tbili was elevated to 6.4 on the same day she underwent EUS with FNA by Dr. Ardis Hughs with findings of an irregular hypoechoic 3.4 cm mass in the pancreatic head with a 1.5 cm segment of invasion into the portal vein. There was no peripancreatic adenopathy. She also underwent placement of metallic biliary stent of malignant stricture and obstruction of the distal CBD. Pathology showed malignant cells consistent with adenocarcinoma.   Socially, she is married with 2 children. 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 Prior to 10/2019 she was active and independent, now she requires occasional assistant for bathing and does not drive. She spends most of the day resting or sedentary at home. She denies prior or current use of alcohol,  tobacco, or recreational drugs. Family history is positive for breast cancer in a paternal aunt. Her MGF had rectal bleeding then died shortly after, she does not know if he had a diagnosis of colorectal cancer.   Today, she presents with her husband. She has abdominal discomfort and bloating across her umbilical region. Takes Tylenol which is partially effective. She has constipation, sometimes no BM in 5 days. Her abdominal pain improves after her bowels move. She has intermittent nausea, after ERCP she vomited bile, with decreased appetite. She drinks Boost. She is weak in general. She denies recent fever, chills, cough, chest pain, dyspnea, or leg edema.   MEDICAL HISTORY:  Past Medical History:  Diagnosis Date  . Abdominal distention 12/07/2019  . 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 Asthma    "adult" asthma  . Atrial septal defect, sinus venosus 12/07/2019  . Common bile duct dilation 12/07/2019  . Complication of anesthesia    had trouble waking up on her first colonoscopy  . Dilated pancreatic duct 12/07/2019  . Dysrhythmia    palpitatations  . Elevated lipase 12/07/2019  . Essential hypertension 12/07/2019  . History of kidney stones    on xray only  . Hypothyroidism 12/07/2019   "being watched" not on medications at this time  . Loss of weight 12/07/2019  . Nausea 12/07/2019  . Pancreatic mass 12/07/2019  . Pneumonia   . Pure hypercholesterolemia 12/07/2019    SURGICAL HISTORY: Past Surgical History:  Procedure Laterality Date  . ASD REPAIR, SINUS VENOSUS     at age 52   .  BILIARY STENT PLACEMENT  12/11/2019   Procedure: BILIARY STENT PLACEMENT;  Surgeon: [name], MD;  Location: [facility] ENDOSCOPY;  Service: Endoscopy;;  . COLONOSCOPY    . ENDOSCOPIC RETROGRADE CHOLANGIOPANCREATOGRAPHY (ERCP) WITH PROPOFOL N/A 12/11/2019   Procedure: ENDOSCOPIC RETROGRADE CHOLANGIOPANCREATOGRAPHY (ERCP) WITH PROPOFOL;  Surgeon: [name], MD;  Location: [facility] ENDOSCOPY;  Service: Endoscopy;  Laterality:  N/A;  . ESOPHAGOGASTRODUODENOSCOPY (EGD) WITH PROPOFOL N/A 12/11/2019   Procedure: ESOPHAGOGASTRODUODENOSCOPY (EGD) WITH PROPOFOL;  Surgeon: [name], MD;  Location: [facility] ENDOSCOPY;  Service: Endoscopy;  Laterality: N/A;  . FINE NEEDLE ASPIRATION  12/11/2019   Procedure: FINE NEEDLE ASPIRATION (FNA) LINEAR;  Surgeon: [name], MD;  Location: [facility];  Service: Endoscopy;;  . TONSILLECTOMY    . TUBAL LIGATION    . UPPER ESOPHAGEAL ENDOSCOPIC ULTRASOUND (EUS) N/A 12/11/2019   Procedure: UPPER ESOPHAGEAL ENDOSCOPIC ULTRASOUND (EUS);  Surgeon: [name], MD;  Location: [facility] ENDOSCOPY;  Service: Endoscopy;  Laterality: N/A;    SOCIAL HISTORY: Social History   Socioeconomic History  . Marital status: Married    Spouse name: Not on file  . Number of children: Not on file  . Years of education: Not on file  . Highest education level: Not on file  Occupational History  . Not on file  Tobacco Use  . Smoking status: Never Smoker  . Smokeless tobacco: Never Used  Substance and Sexual Activity  . Alcohol use: Never  . Drug use: Never  . Sexual activity: Not on file  Other Topics Concern  . Not on file  Social History Narrative  . Not on file   Social Determinants of Health   Financial Resource Strain:   . Difficulty of Paying Living Expenses:   Food Insecurity:   . Worried About Charity fundraiser in the Last Year:   . Arboriculturist in the Last Year:   Transportation Needs:   . Film/video editor (Medical):   Marland Kitchen Lack of Transportation (Non-Medical):   Physical Activity:   . Days of Exercise per Week:   . Minutes of Exercise per Session:   Stress:   . Feeling of Stress :   Social Connections:   . Frequency of Communication with Friends and Family:   . Frequency of Social Gatherings with Friends and Family:   . Attends Religious Services:   . Active Member of Clubs or Organizations:   . Attends Archivist Meetings:   Marland Kitchen Marital Status:     Intimate Partner Violence:   . Fear of Current or Ex-Partner:   . Emotionally Abused:   Marland Kitchen Physically Abused:   . Sexually Abused:     FAMILY HISTORY: Family History  Problem Relation Age of Onset  . COPD Mother   . Dementia Father   . Diabetes Father   . Hypertension Father   . Alzheimer's disease Brother     ALLERGIES:  is allergic to ultram [tramadol]; penicillins; and sulfa antibiotics.  MEDICATIONS:  Current Outpatient Medications  Medication Sig Dispense Refill  . acetaminophen (TYLENOL) 500 MG tablet Take 500 mg by mouth every 6 (six) hours as needed for moderate pain or headache.     . albuterol (VENTOLIN HFA) 108 (90 Base) MCG/ACT inhaler Inhale 2 puffs into the lungs every 4 (four) hours as needed for wheezing or shortness of breath.     Marland Kitchen aspirin EC 81 MG tablet Take 81 mg by mouth at bedtime.     Marland Kitchen atorvastatin (LIPITOR)  40 MG tablet Take 40 mg by mouth at bedtime.     . calcium carbonate (TUMS EX) 750 MG chewable tablet Chew 750 mg by mouth daily as needed for heartburn.    . losartan (COZAAR) 50 MG tablet Take 50 mg by mouth at bedtime.     . magnesium hydroxide (MILK OF MAGNESIA) 400 MG/5ML suspension Take 15 mLs by mouth daily as needed for mild constipation.    . Melatonin 5 MG CHEW Chew 5 mg by mouth at bedtime as needed (sleep).    . Omega-3 Fatty Acids (FISH OIL) 1000 MG CAPS Take 1,000 mg by mouth daily.     Marland Kitchen OVER THE COUNTER MEDICATION Apply 1 application topically at bedtime as needed (foot burning). amish foot cream    . oxymetazoline (VICKS SINEX) 0.05 % nasal spray Place 1 spray into both nostrils 2 (two) times daily as needed for congestion.    Marland Kitchen HYDROcodone-acetaminophen (NORCO/VICODIN) 5-325 MG tablet Take 1 tablet by mouth every 6 (six) hours as needed for moderate pain. 30 tablet 0   No current facility-administered medications for this visit.    REVIEW OF SYSTEMS:   Constitutional: Denies fevers, chills or abnormal night sweats (+) decreased  appetite (+) weight loss (+) fatigue  Eyes: Denies blurriness of vision, double vision or watery eyes Ears, nose, mouth, throat, and face: Denies mucositis or sore throat Respiratory: Denies cough, dyspnea or wheezes Cardiovascular: Denies palpitation, chest discomfort or lower extremity swelling Gastrointestinal:  Denies diarrhea, heartburn or change in bowel habits (+) abdominal pain (+) bloating (+) nausea (+) constipation  Skin: Denies abnormal skin rashes Lymphatics: Denies new lymphadenopathy or easy bruising Neurological:Denies numbness, tingling (+) general weakness  Behavioral/Psych: Mood is stable, no new changes  All other systems were reviewed with the patient and are negative.  PHYSICAL EXAMINATION:  Vitals:   12/15/19 1205  BP: 139/82  Pulse: (!) 110  Resp: 17  Temp: 98 F (36.7 C)  SpO2: 98%   Filed Weights   12/15/19 1205  Weight: 125 lb 8 oz (56.9 kg)    GENERAL:alert, no distress and comfortable SKIN: no rash  EYES: sclera clear LYMPH: no adenopathy  LUNGS: clear with normal breathing effort HEART: regular rate & rhythm, no lower extremity edema ABDOMEN: abdomen soft with normal bowel sounds. Tenderness to umbilicus and RUQ  Musculoskeletal: no focal spinal tenderness  PSYCH: alert & oriented x 3 with fluent speech NEURO: no focal motor/sensory deficits  LABORATORY DATA:  I have reviewed the data as listed No flowsheet data found.   RADIOGRAPHIC STUDIES: I have personally reviewed the radiological images as listed and agreed with the findings in the report. DG ERCP  Result Date: 12/11/2019 CLINICAL DATA:  Biliary obstruction and pancreatic mass. EXAM: ERCP TECHNIQUE: Multiple spot images obtained with the fluoroscopic device and submitted for interpretation post-procedure. COMPARISON:  Outside CT and MRI studies performed in [place], [place]. FINDINGS: Submitted imaging from the procedure performed with a C-arm demonstrates cannulation of the common  bile duct with contrast injection demonstrating severe stricture of the distal common bile duct and dilatation of the proximal duct and intrahepatic ducts. The level of biliary stricture and obstruction was able to be crossed with a guidewire allowing placement of a self expanding metallic biliary stent that extends from the duodenum into the proximal common bile duct. IMPRESSION: Severe stricture and obstruction of the distal common bile duct treated by placement of a self expanding metallic biliary stent. Nature of stricture is suspicious for  a malignant stricture due to pancreatic neoplasm. These images were submitted for radiologic interpretation only. Please see the procedural report for the amount of contrast and the fluoroscopy time utilized. Electronically Signed   By: Aletta Edouard M.D.   On: 12/11/2019 13:11   CT OUTSIDE FILMS BODY/ABD/PELVIS  Result Date: 12/11/2019 This examination belongs to an outside facility and is stored here for comparison purposes only.  Contact the originating outside institution for any associated report or interpretation.   ASSESSMENT & PLAN:   1. 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   -12/02/19 MRI of the abdomen - showed a 18 x17 mm mass in the head of the pancreas with pancreatic duct and CBD dilatation. There are small cysts noted in the right liver lobe, otherwise no evidence of hepatic mets or other metastatic disease in the abdomen.  -CA 19-9 1349   -12/11/19 EUS/FNA per Dr. Ardis Hughs - irregular hypoechoic 3.4 cm mass in the pancreatic head with a 1.5 cm segment of invasion into the portal vein. There was no peripancreatic adenopathy.   -Pathology showed malignant cells consistent with adenocarcinoma  -12/11/19 ERCP with placement of metallic biliary stent of  malignant stricture and obstruction of the distal CBD.    2. Abdominal discomfort, nausea, constipation, secondary to #1   -s/p ERCP and stenting of malignant stricture of distal CBD   -We reviewed bowel regimen and pain control. Given tramadol PRN on 12/15/19   3. Transaminitis and hyperbilirubinemia, secondary to #1  12/04/19 Bili 3.3, ALT 538 AST 527 and ALK PHOS 414.  -12/11/19 Tbili was elevated to 6.4   -juandice resolved after ERCP and stenting   4. Anorexia and weight loss  -presented with 15 lbs weight loss, secondary to #1  -she has been referred to dietician   5. Genetics   -Her family history is not strongly suggestive of an inheritable cancer syndrome.   -Due to her personal history of pancreas cancer, she qualifies for genetics, r/o BRCA mutation.   -If positive, she would be a candidate for PARP inhibitor in the future    Disposition:  Mary [name] has been diagnosed with what appears to be localized adenocarcinoma of the head of the pancreas. From the initial scans and EUS, this appears to be borderline resectable. She is being referred to surgeon, Dr. Barry Dienes. Her case was reviewed in this week's GI tumor board, the recommendation is to obtain CT AP with pancreas protocol in addition to a CT chest to complete staging.  We discussed the aggressive nature of pancreas adenocarcinoma and the high recurrence risk even after complete surgical resection, which is the only way to potentially cure this cancer. We discussed the risk/benefit of neoadjuvant vs adjuvant chemo. If she is felt to be a surgical candidate, she would likely be offered moderate intensity neoadjuvant chemotherapy with gemcitabine and abraxane. This may be followed by a course of consolidation radiation preoperatively. She is interested.  Mary [name] presents with pain, constipation, fatigue, and weight loss. She was given a prescription for Norco. Her performance status is low. We strongly encouraged her to  increase her physical strength and mobility, improve nutrition and hydration.   She will return in 2 weeks after CT CAP and consult with Dr. Barry Dienes to review results, monitor her improvement, and finalize her treatment plan. She  has been referred to dietician. She agrees to genetic testing. She is a candidate for exact science study, the research nurse will be contacting her.   Orders Placed This Encounter  Procedures  . CT Chest Wo Contrast    Standing Status:   Future    Standing Expiration Date:   12/14/2020    Order Specific Question:   Preferred imaging location?    Answer:   [facility]    Order Specific Question:   Radiology Contrast Protocol - do NOT remove file path    Answer:   \\[email or web address].[email or web address]  . CT Abdomen Pelvis W Contrast    Pancreatic protocol    Standing Status:   Future    Standing Expiration Date:   12/15/2020    Order Specific Question:   ** REASON FOR EXAM (FREE TEXT)    Answer:   pancreatic protocol    Order Specific Question:   If indicated for the ordered procedure, I authorize the administration of contrast media per Radiology protocol    Answer:   Yes    Order Specific Question:   Preferred imaging location?    Answer:   [facility]    Order Specific Question:   Is Oral Contrast requested for this exam?    Answer:   Yes, Per Radiology protocol    Order Specific Question:   Radiology Contrast Protocol - do NOT remove file path    Answer:   \\[email or web address]  . Genetic Screening Order    invitae panel  Pancreas R/o BRCA mutation    Standing Status:   Future    Standing Expiration Date:   12/14/2020  . CBC with Differential (Cancer Center Only)    Standing Status:   Future    Standing Expiration Date:   12/14/2020  . CMP ([place] only)    Standing Status:   Future    Standing Expiration Date:   12/14/2020  . CA 19.9    Standing Status:   Future    Standing Expiration Date:    12/14/2020  . Ambulatory referral to General Surgery    Referral Priority:   Routine    Referral Type:   Surgical    Referral Reason:   Specialty Services Required    Referred to Provider:   Stark Klein, MD    Requested Specialty:   General Surgery    Number of Visits Requested:   1    All questions were answered. The patient knows to call the clinic with any problems, questions or concerns.    Alla Feeling, NP 12/16/2019  This was a shared visit with Cira Rue.  Mary [name] was interviewed and examined.  We reviewed the CT/MRI reports and the EUS findings.  She has been diagnosed with locally advanced pancreas cancer.  She appears to have borderline resectable disease.  We will complete a staging evaluation with a chest CT and pancreas protocol CT abdomen.  The initial plan is to consider neoadjuvant chemotherapy if she is deemed a potential surgical candidate.   Mary [name] will begin a trial of hydrocodone for pain.  We referred her to Dr. Barry Dienes.  She will return for an office visit and further discussion in approximately 2 weeks.  Julieanne Manson, MD

## 2019-12-14 NOTE — Progress Notes (Signed)
Spoke with patient and her husband by phone (on speaker phone) today introducing myself and explaining my role as GI nurse navigator.  They have concerns regarding her diagnosis, pain, nausea and inability to eat at this point.  She is drinking lots of fluids and Boost.  I explained to her that if she is not eating solid foods that she needs to drink at least 3 to 4 Boost or protein shakes daily.  Their son works for the [employer] and they are bringing [place] paperwork with them that needs to be filled out so he can take time off and help support his mother.  She states she has waves of nausea and abdominal discomfort especially in the rib area. I told her we will address these issues tomorrow during her medical oncology appointment.  They were given my direct contact number to reach me with any questions or concerns.  They both verbalized an understanding of all information given and their questions were answered.

## 2019-12-14 NOTE — Progress Notes (Signed)
I have made a referral to Nutritionist.

## 2019-12-15 ENCOUNTER — Other Ambulatory Visit: Payer: Self-pay

## 2019-12-15 ENCOUNTER — Inpatient Hospital Stay: Payer: Medicare Other

## 2019-12-15 ENCOUNTER — Telehealth: Payer: Self-pay | Admitting: Oncology

## 2019-12-15 ENCOUNTER — Inpatient Hospital Stay: Payer: Medicare Other | Attending: Nurse Practitioner | Admitting: Nurse Practitioner

## 2019-12-15 VITALS — BP 139/82 | HR 110 | Temp 98.0°F | Resp 17 | Ht 61.0 in | Wt 125.5 lb

## 2019-12-15 DIAGNOSIS — C250 Malignant neoplasm of head of pancreas: Secondary | ICD-10-CM | POA: Diagnosis not present

## 2019-12-15 DIAGNOSIS — R7401 Elevation of levels of liver transaminase levels: Secondary | ICD-10-CM

## 2019-12-15 DIAGNOSIS — R109 Unspecified abdominal pain: Secondary | ICD-10-CM | POA: Diagnosis not present

## 2019-12-15 DIAGNOSIS — R630 Anorexia: Secondary | ICD-10-CM

## 2019-12-15 DIAGNOSIS — Z803 Family history of malignant neoplasm of breast: Secondary | ICD-10-CM

## 2019-12-15 DIAGNOSIS — R5383 Other fatigue: Secondary | ICD-10-CM

## 2019-12-15 DIAGNOSIS — R110 Nausea: Secondary | ICD-10-CM | POA: Diagnosis not present

## 2019-12-15 DIAGNOSIS — R634 Abnormal weight loss: Secondary | ICD-10-CM

## 2019-12-15 DIAGNOSIS — K5900 Constipation, unspecified: Secondary | ICD-10-CM | POA: Diagnosis not present

## 2019-12-15 MED ORDER — HYDROCODONE-ACETAMINOPHEN 5-325 MG PO TABS: 1.0000 | ORAL_TABLET | Freq: Four times a day (QID) | ORAL | 0 refills | Status: AC | PRN

## 2019-12-15 NOTE — Progress Notes (Signed)
Met with patient and her husband [name] today at her initial medical oncology appointment with Dr. Benay Spice.  They were given my direct number, information sheet on navigation role, Symptom Management Clinic and support groups available here at the [facility].  She was also given a booklet on Pancreatic Cancer.  They verbalized an understanding of my role and I encouraged them to call me directly with any concerns or questions.  They live in [place], [place] but have not problems driving to [facility] for treatment.  She already has a Nutritionist appointment this afternoon by phone.  I will also have one of our social workers to reach out to them as well.

## 2019-12-15 NOTE — Telephone Encounter (Signed)
Scheduled per los. Gave avs and calendar  

## 2019-12-15 NOTE — Progress Notes (Signed)
Nutrition Assessment   Reason for Assessment:   Weight loss, new pancreatic cancer   ASSESSMENT:  69 year old female with new pancreatic mass. Placement of metallic biliary stent was done on 12/11/19.  Past medical history of HTN, hypothyroidism, hypercholesterolemia.   Spoke with patient and husband via phone this afternoon.  Husband reports that appetite has been poor with eating mostly liquids following stent placement.  Has been drinking 1 maybe 2 boost max protein mixed with ice cream, gingerale, jello, popsciles, colas.  Reports constipation but that she started having bowel movement last night and was able to eat a piece of cake this am for breakfast.  Husband has zofran and phenergan on hand at home to give for nausea. Reports abdominal fullness but some better with bowels beginning to move.     Medications: omega 3    Labs: reviewed   Anthropometrics:   Height: 61 inches Weight: 125 lb 3/23 UBW: 139 lb in Feb 2021 BMI: 23  10% weight loss in the last 2 months, significant   Estimated Energy Needs  Kcals: 1700-2000 Protein: 85-100 g Fluid: > 2 L   NUTRITION DIAGNOSIS: Inadequate oral intake related to cancer, abdominal pain/fullness, nausea, constipation as evidenced by 10% weight loss in the last 2 months    INTERVENTION:  Encouraged patient to try boost high protein (240 calorie and 20 g protein) for higher calories and protein or 350 calorie shake.  Discussed options for using shake for base of smoothie.   Encouraged small frequent sips/bites/nibbles q 1-2 hours.   Encouraged using medications to manage symptoms.  Discussed foods high in protein for patient to try.   Contact information provided   MONITORING, EVALUATION, GOAL: Patient will consume adequate calories and protein to prevent weight loss   Next Visit: phone f/u April 14th  Turner Kunzman B. Zenia Resides, [place], [place] Registered Dietitian [phone] (pager)

## 2019-12-16 ENCOUNTER — Encounter: Payer: Self-pay | Admitting: Nurse Practitioner

## 2019-12-16 ENCOUNTER — Other Ambulatory Visit: Payer: Self-pay

## 2019-12-16 ENCOUNTER — Telehealth: Payer: Self-pay

## 2019-12-16 NOTE — Progress Notes (Signed)
Spoke with patient and her husband regarding upcoming CT scan.  As discussed in Tumor Board this morning the recommendation is to obtain CT of Abd/Pelvis as well as CT chest.  This has been scheduled for 4/8 at [facility] patient to arrive at 3:15.  I instructed them she needs to drink two bottles of oral prep, one at 1:30 and the second at 2:30 prior to scan, NPO 6 hours prior.  They will try to pick these up locally at [place] where she has had scans done before.  If her husband finds they are unable to do this he will let me know and I will have some ready for him at the front desk.  They both verbalized an understanding.

## 2019-12-16 NOTE — Telephone Encounter (Signed)
I would recommend ensure plus or boost plus 3-4 times per day for added nutrition for duration of treatment or until appetite improves.  Provider would need to write prescription for patient to provide local non-profit.

## 2019-12-16 NOTE — Telephone Encounter (Signed)
Received in basket message from patient stating that she needs a note stating the quantity and duration that she will be needing nutritional supplements. She has reached out to a local non-profit for assistance and needs a note so that she can be reimbursed. Per Cira Rue NP reached out to Jennet Maduro RD to see if she could assist patient with note since she last spoke with patient yesterday about her dietary needs. In basket message forwarded to her.

## 2019-12-16 NOTE — Telephone Encounter (Signed)
PROGRESS NOTE: Received message back from Jennet Maduro RD.She recommend ensure plus or boost plus 3-4 times per day for added nutrition for duration of treatment or until appetite improves.  She also let me know that the provider would need to write prescription for patient to provide local non-profit. Left message for Lacie NP to let her know this.

## 2019-12-18 ENCOUNTER — Telehealth: Payer: Self-pay

## 2019-12-18 ENCOUNTER — Encounter: Payer: Self-pay | Admitting: *Deleted

## 2019-12-18 ENCOUNTER — Encounter: Payer: Self-pay | Admitting: Oncology

## 2019-12-18 ENCOUNTER — Telehealth: Payer: Self-pay | Admitting: Oncology

## 2019-12-18 DIAGNOSIS — C250 Malignant neoplasm of head of pancreas: Secondary | ICD-10-CM

## 2019-12-18 NOTE — Telephone Encounter (Signed)
Scheduled apt per 4/2 shc message- pt aware of appt added for genetics

## 2019-12-18 NOTE — Progress Notes (Signed)
Order written for Ensure Plus or Boost Plus 3-4/day for 6 months for nutritional needs. Dx: 25.9. Placed at front desk for patient to pick up.

## 2019-12-21 NOTE — Telephone Encounter (Signed)
EXACT SCIENCES 2018-01 STUDY; BLOOD SAMPLE COLLECTION TO EVALUATE BIOMARKERS IN SUBJECTS WITH UNTREATED SOLID TUMORS.   12/21/2019 16:02PM  TELEPHONE CALL: Outgoing call (10 minutes) to patient inquiring about interest in voluntary participation in the [employer] study. Briefly reviewed the study objective and requirements, including blood draw procedure and personal history interview. Patient verbalizes interest in participating. Asked if I may email a copy of the consent and HIPAA form for her to review and she verbalizes approval. Patient requests the information be sent to her husband's email at [email or web address] so they are both able to review the forms together. Patient currently has an existing lab appointment scheduled for 12/29/19 at 11:30am; asked if she would be willing to arrive at 11am to review and sign the consent and HIPAA forms and complete the history interview prior to lab draw: patient verbalizes agreement. Advised that I will schedule a separate appointment that will appear in [facility] for 12/29/19 at 11am. Consent and HIPAA forms version 3.0 (IRB approved 07/15/2018) were emailed as requested to the email address listed above. Patient was thanked for her time and potential interest in this study and encouraged to call or email me with any questions: direct contact information was provided.  Dionne Bucy. Sharlett Iles, BSN, RN, CIC 12/21/2019 4:46 PM

## 2019-12-23 ENCOUNTER — Other Ambulatory Visit: Payer: Self-pay | Admitting: Nurse Practitioner

## 2019-12-23 DIAGNOSIS — C250 Malignant neoplasm of head of pancreas: Secondary | ICD-10-CM

## 2019-12-24 ENCOUNTER — Encounter: Payer: Self-pay | Admitting: Nurse Practitioner

## 2019-12-24 ENCOUNTER — Inpatient Hospital Stay: Payer: Medicare Other | Attending: Nurse Practitioner

## 2019-12-24 ENCOUNTER — Other Ambulatory Visit: Payer: Self-pay | Admitting: *Deleted

## 2019-12-24 ENCOUNTER — Ambulatory Visit (HOSPITAL_COMMUNITY): Admission: RE | Admit: 2019-12-24 | Payer: Medicare Other | Source: Ambulatory Visit

## 2019-12-24 ENCOUNTER — Other Ambulatory Visit: Payer: Self-pay | Admitting: Nurse Practitioner

## 2019-12-24 ENCOUNTER — Ambulatory Visit (HOSPITAL_COMMUNITY)
Admission: RE | Admit: 2019-12-24 | Discharge: 2019-12-24 | Disposition: A | Discharge status: Home / Self Care | Payer: Medicare Other | Source: Ambulatory Visit | Attending: Nurse Practitioner | Admitting: Nurse Practitioner

## 2019-12-24 ENCOUNTER — Other Ambulatory Visit: Payer: Self-pay

## 2019-12-24 DIAGNOSIS — K8689 Other specified diseases of pancreas: Secondary | ICD-10-CM

## 2019-12-24 DIAGNOSIS — C250 Malignant neoplasm of head of pancreas: Secondary | ICD-10-CM

## 2019-12-24 DIAGNOSIS — R109 Unspecified abdominal pain: Secondary | ICD-10-CM | POA: Insufficient documentation

## 2019-12-24 DIAGNOSIS — K5900 Constipation, unspecified: Secondary | ICD-10-CM | POA: Diagnosis not present

## 2019-12-24 DIAGNOSIS — K7689 Other specified diseases of liver: Secondary | ICD-10-CM | POA: Insufficient documentation

## 2019-12-24 DIAGNOSIS — Z5111 Encounter for antineoplastic chemotherapy: Secondary | ICD-10-CM | POA: Diagnosis present

## 2019-12-24 DIAGNOSIS — R630 Anorexia: Secondary | ICD-10-CM | POA: Diagnosis not present

## 2019-12-24 DIAGNOSIS — R634 Abnormal weight loss: Secondary | ICD-10-CM | POA: Diagnosis not present

## 2019-12-24 DIAGNOSIS — R110 Nausea: Secondary | ICD-10-CM | POA: Insufficient documentation

## 2019-12-24 LAB — BASIC METABOLIC PANEL - CANCER CENTER ONLY
Anion gap: 11 (ref 5–15)
BUN: 27 mg/dL — ABNORMAL HIGH (ref 8–23)
CO2: 27 mmol/L (ref 22–32)
Calcium: 10 mg/dL (ref 8.9–10.3)
Chloride: 101 mmol/L (ref 98–111)
Creatinine: 0.95 mg/dL (ref 0.44–1.00)
GFR, Est AFR Am: >60 mL/min (ref >60)
GFR, Estimated: >60 mL/min (ref >60)
Glucose, Bld: 130 mg/dL — ABNORMAL HIGH (ref 70–99)
Potassium: 4.4 mmol/L (ref 3.5–5.1)
Sodium: 139 mmol/L (ref 135–145)

## 2019-12-24 MED ORDER — MIRTAZAPINE 7.5 MG PO TABS: 7.5000 mg | ORAL_TABLET | Freq: Every day | ORAL | 0 refills | Status: DC

## 2019-12-24 MED ORDER — DICYCLOMINE HCL 10 MG PO CAPS: 10.0000 mg | ORAL_CAPSULE | Freq: Two times a day (BID) | ORAL | 0 refills | Status: DC | PRN

## 2019-12-24 MED ORDER — SODIUM CHLORIDE (PF) 0.9 % IJ SOLN
INTRAMUSCULAR | Status: AC
  Filled 2019-12-24: qty 50

## 2019-12-24 NOTE — Progress Notes (Signed)
Ms. Ganim was seen briefly after CT AP w contrast was aborted due to low eGFR from 3/26. She had already drank 2 bottles of contrast and feels bloated. She reports intermittent epigastric pain that feel "gripping" at times and also mild intermittent RUQ pain since ERCP. She takes 1/2 tab norco in the morning and full tab at time, occasional tylenol in between. Her pain is mostly controlled except with periodic sharp pains across her belly. She required an enema for large BM 2 days ago. She takes colace and miralax occasionally. Her exam shows tenderness in the epigastric area consistent with known pancreas mass. No RUQ tenderness. No clinical signs of juandice. I suspect her various pains are residual from ERCP but primarily related to pancreas mass and constipation. I recommend to continue norco PRN, increase colace and miralax to 1-2 times daily to maintain daily BM. I will also send in bentyl per their request.   We discussed her low appetite. I reviewed side effects of mirtazapine, she is interested. I sent that prescription in as well.   Her repeat BMP today shows normal renal function, eGFR >60. We will reschedule her CT AP w contrast/pancreas protocol asap. We will reschedule her f/u if needed, to see her back after the scan. They understand and agree with the plan. I reviewed with Dr. Benay Spice as well.   Cira Rue, NP

## 2019-12-25 ENCOUNTER — Encounter: Payer: Self-pay | Admitting: Nurse Practitioner

## 2019-12-25 NOTE — Progress Notes (Signed)
Patient's husband calls stating they went to for CT scans yesterday afternoon and radiology could only do part of them.  I called radiology and spoke with Judeen Hammans and she researched this.  It looks like because her kidney function was not good they could not do the CT Abd/Pelvis w/contrast.  She has been rescheduled for Tuesday 4/13 at 10:00 to arrive at 9:45, NPO 4 hours before, drink oral prep one bottle at 8 and one bottle at 9.  He will try to get the oral prep locally in [place] if not they will be here on Tuesday before 8 am in radiology to drink the oral prep here.  I apologized for the inconvenience this has caused them both.

## 2019-12-28 ENCOUNTER — Other Ambulatory Visit (HOSPITAL_COMMUNITY): Payer: Medicare Other

## 2019-12-28 DIAGNOSIS — C250 Malignant neoplasm of head of pancreas: Secondary | ICD-10-CM

## 2019-12-28 NOTE — Research (Signed)
EXACT SCIENCES 2018-01 STUDY; BLOOD SAMPLE COLLECTION TO EVALUATE BIOMARKERS INSUBJECTS WITH UNTREATED SOLID TUMORS.  12/28/2019 10:21AM  TELEPHONE CALL: Outgoing call (10 minutes) to patient and spouse (both on phone). Both verbalize they have reviewed the [employer] consent form and confirm continued interest in voluntarily participating in the study. Patient is now scheduled for a CT with contrast tomorrow morning so the current plan is to meet with them as planned at 11am to review and sign the consent and collect personal history information; labs will be drawn at a later time (cannot have research labs drawn within 24 hours of IV contrast for this study). Both patient and spouse verbalize understanding of the plan and were thanked for their time and participation.  Dionne Bucy. Sharlett Iles, BSN, RN, CIC 12/28/2019 11:13 AM

## 2019-12-29 ENCOUNTER — Telehealth: Payer: Self-pay | Admitting: *Deleted

## 2019-12-29 ENCOUNTER — Encounter: Payer: Self-pay | Admitting: Genetic Counselor

## 2019-12-29 ENCOUNTER — Ambulatory Visit (HOSPITAL_COMMUNITY)
Admission: RE | Admit: 2019-12-29 | Discharge: 2019-12-29 | Disposition: A | Discharge status: Home / Self Care | Payer: Medicare Other | Source: Ambulatory Visit | Attending: Nurse Practitioner | Admitting: Nurse Practitioner

## 2019-12-29 ENCOUNTER — Other Ambulatory Visit: Payer: Self-pay

## 2019-12-29 ENCOUNTER — Inpatient Hospital Stay: Payer: Medicare Other

## 2019-12-29 ENCOUNTER — Encounter (HOSPITAL_COMMUNITY): Payer: Self-pay

## 2019-12-29 ENCOUNTER — Inpatient Hospital Stay (HOSPITAL_BASED_OUTPATIENT_CLINIC_OR_DEPARTMENT_OTHER): Payer: Medicare Other | Admitting: Oncology

## 2019-12-29 ENCOUNTER — Inpatient Hospital Stay (HOSPITAL_BASED_OUTPATIENT_CLINIC_OR_DEPARTMENT_OTHER): Payer: Medicare Other | Admitting: Genetic Counselor

## 2019-12-29 VITALS — BP 129/78 | HR 94 | Temp 97.8°F | Resp 18 | Ht 61.0 in | Wt 127.4 lb

## 2019-12-29 DIAGNOSIS — C250 Malignant neoplasm of head of pancreas: Secondary | ICD-10-CM

## 2019-12-29 DIAGNOSIS — Z5111 Encounter for antineoplastic chemotherapy: Secondary | ICD-10-CM | POA: Diagnosis not present

## 2019-12-29 DIAGNOSIS — Z803 Family history of malignant neoplasm of breast: Secondary | ICD-10-CM | POA: Diagnosis not present

## 2019-12-29 LAB — CMP (CANCER CENTER ONLY)
ALT: 72 U/L — ABNORMAL HIGH (ref 0–44)
AST: 54 U/L — ABNORMAL HIGH (ref 15–41)
Albumin: 3.3 g/dL — ABNORMAL LOW (ref 3.5–5.0)
Alkaline Phosphatase: 173 U/L — ABNORMAL HIGH (ref 38–126)
Anion gap: 12 (ref 5–15)
BUN: 17 mg/dL (ref 8–23)
CO2: 30 mmol/L (ref 22–32)
Calcium: 9.7 mg/dL (ref 8.9–10.3)
Chloride: 97 mmol/L — ABNORMAL LOW (ref 98–111)
Creatinine: 1 mg/dL (ref 0.44–1.00)
GFR, Est AFR Am: >60 mL/min (ref >60)
GFR, Estimated: 58 mL/min — ABNORMAL LOW (ref >60)
Glucose, Bld: 123 mg/dL — ABNORMAL HIGH (ref 70–99)
Potassium: 4.3 mmol/L (ref 3.5–5.1)
Sodium: 139 mmol/L (ref 135–145)
Total Bilirubin: 0.9 mg/dL (ref 0.3–1.2)
Total Protein: 7.6 g/dL (ref 6.5–8.1)

## 2019-12-29 LAB — GENETIC SCREENING ORDER

## 2019-12-29 LAB — CBC WITH DIFFERENTIAL (CANCER CENTER ONLY)
Abs Immature Granulocytes: 0.02 10*3/uL (ref 0.00–0.07)
Basophils Absolute: 0 10*3/uL (ref 0.0–0.1)
Basophils Relative: 1 %
Eosinophils Absolute: 0.2 10*3/uL (ref 0.0–0.5)
Eosinophils Relative: 3 %
HCT: 39.2 % (ref 36.0–46.0)
Hemoglobin: 12.7 g/dL (ref 12.0–15.0)
Immature Granulocytes: 0 %
Lymphocytes Relative: 24 %
Lymphs Abs: 1.9 10*3/uL (ref 0.7–4.0)
MCH: 29.9 pg (ref 26.0–34.0)
MCHC: 32.4 g/dL (ref 30.0–36.0)
MCV: 92.2 fL (ref 80.0–100.0)
Monocytes Absolute: 0.4 10*3/uL (ref 0.1–1.0)
Monocytes Relative: 5 %
Neutro Abs: 5.2 10*3/uL (ref 1.7–7.7)
Neutrophils Relative %: 67 %
Platelet Count: 362 10*3/uL (ref 150–400)
RBC: 4.25 MIL/uL (ref 3.87–5.11)
RDW: 12.9 % (ref 11.5–15.5)
WBC Count: 7.9 10*3/uL (ref 4.0–10.5)
nRBC: 0 % (ref 0.0–0.2)

## 2019-12-29 MED ORDER — ONDANSETRON 8 MG PO TBDP: 8.0000 mg | ORAL_TABLET | Freq: Three times a day (TID) | ORAL | 1 refills | Status: AC | PRN

## 2019-12-29 MED ORDER — IOHEXOL 300 MG/ML  SOLN
100.0000 mL | Freq: Once | INTRAMUSCULAR | Status: AC | PRN
  Administered 2019-12-29: 100 mL via INTRAVENOUS

## 2019-12-29 MED ORDER — SODIUM CHLORIDE (PF) 0.9 % IJ SOLN
INTRAMUSCULAR | Status: AC
  Filled 2019-12-29: qty 50

## 2019-12-29 NOTE — Progress Notes (Signed)
REFERRING PROVIDER: Alla Feeling, NP [place],  [place] [address]  PRIMARY PROVIDER:  System, Provider Not In  PRIMARY REASON FOR VISIT:  1. Malignant neoplasm of head of pancreas ([place])   2. Family history of breast cancer     HISTORY OF PRESENT ILLNESS:   Mary [name], a 69 y.o. female, was seen for a [facility] cancer genetics consultation at the request of Dr. Kalman Shan due to a personal history of pancreatic cancer and a family history of breast cancer.  Mary [name] presents to clinic today with her husband to discuss the possibility of a hereditary predisposition to cancer, genetic testing, and to further clarify her future cancer risks, as well as potential cancer risks for family members.   In March 2021, at the age of 64, Mary [name] was diagnosed with pancreatic cancer. The treatment plan includes chemotherapy beginning 01/07/2020 and referral to Dr. Barry Dienes to discuss surgical options.    RISK FACTORS:  Menarche was at age 17.  First live birth at age 28.  OCP use for approximately 20+ years.  Ovaries intact: yes.  Hysterectomy: no.  Menopausal status: postmenopausal.  HRT use: 0 years. Colonoscopy: yes; 2017 - no polyps per patient. Mammogram within the last year: yes. Number of breast biopsies: 0. Any excessive radiation exposure in the past: no  Past Medical History:  Diagnosis Date  . Abdominal distention 12/07/2019  . 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 Asthma    "adult" asthma  . Atrial septal defect, sinus venosus 12/07/2019  . Common bile duct dilation 12/07/2019  . Complication of anesthesia    had trouble waking up on her first colonoscopy  . Dilated pancreatic duct 12/07/2019  . Dysrhythmia    palpitatations  . Elevated lipase 12/07/2019  . Essential hypertension 12/07/2019  . Family history of breast cancer   . History of kidney stones    on xray only  . Hypothyroidism 12/07/2019   "being watched" not on medications at this time  . Loss of weight  12/07/2019  . Nausea 12/07/2019  . Pancreatic mass 12/07/2019  . Pneumonia   . Pure hypercholesterolemia 12/07/2019    Past Surgical History:  Procedure Laterality Date  . ASD REPAIR, SINUS VENOSUS     at age 70   . BILIARY STENT PLACEMENT  12/11/2019   Procedure: BILIARY STENT PLACEMENT;  Surgeon: [name], MD;  Location: [facility] ENDOSCOPY;  Service: Endoscopy;;  . COLONOSCOPY    . ENDOSCOPIC RETROGRADE CHOLANGIOPANCREATOGRAPHY (ERCP) WITH PROPOFOL N/A 12/11/2019   Procedure: ENDOSCOPIC RETROGRADE CHOLANGIOPANCREATOGRAPHY (ERCP) WITH PROPOFOL;  Surgeon: [name], MD;  Location: [facility] ENDOSCOPY;  Service: Endoscopy;  Laterality: N/A;  . ESOPHAGOGASTRODUODENOSCOPY (EGD) WITH PROPOFOL N/A 12/11/2019   Procedure: ESOPHAGOGASTRODUODENOSCOPY (EGD) WITH PROPOFOL;  Surgeon: [name], MD;  Location: [facility] ENDOSCOPY;  Service: Endoscopy;  Laterality: N/A;  . FINE NEEDLE ASPIRATION  12/11/2019   Procedure: FINE NEEDLE ASPIRATION (FNA) LINEAR;  Surgeon: [name], MD;  Location: [facility];  Service: Endoscopy;;  . TONSILLECTOMY    . TUBAL LIGATION    . UPPER ESOPHAGEAL ENDOSCOPIC ULTRASOUND (EUS) N/A 12/11/2019   Procedure: UPPER ESOPHAGEAL ENDOSCOPIC ULTRASOUND (EUS);  Surgeon: [name], MD;  [facility] ENDOSCOPY;  Service: Endoscopy;  Laterality: N/A;    Social History   Socioeconomic History  . Marital status: Married    Spouse name: Not on file  . Number of children: Not on file  . Years of education: Not on file  .  Highest education level: Not on file  Occupational History  . Not on file  Tobacco Use  . Smoking status: Never Smoker  . Smokeless tobacco: Never Used  Substance and Sexual Activity  . Alcohol use: Never  . Drug use: Never  . Sexual activity: Not on file  Other Topics Concern  . Not on file  Social History Narrative  . Not on file   Social Determinants of Health   Financial Resource Strain:   . Difficulty of Paying Living Expenses:    Food Insecurity:   . Worried About Charity fundraiser in the Last Year:   . Arboriculturist in the Last Year:   Transportation Needs:   . Film/video editor (Medical):   Marland Kitchen Lack of Transportation (Non-Medical):   Physical Activity:   . Days of Exercise per Week:   . Minutes of Exercise per Session:   Stress:   . Feeling of Stress :   Social Connections:   . Frequency of Communication with Friends and Family:   . Frequency of Social Gatherings with Friends and Family:   . Attends Religious Services:   . Active Member of Clubs or Organizations:   . Attends Archivist Meetings:   Marland Kitchen Marital Status:      FAMILY HISTORY:  We obtained a detailed, 4-generation family history.  Significant diagnoses are listed below: Family History  Problem Relation Age of Onset  . COPD Mother   . Dementia Father   . Diabetes Father   . Hypertension Father   . Alzheimer's disease Brother   . Breast cancer Paternal Aunt        dx. in her early 85s  . Diabetes Paternal Aunt   . Diabetes Paternal Uncle   . Other Maternal Grandfather        hospitalized for rectal bleeding before death  . Dementia Paternal Grandfather   . Dementia Paternal Uncle    Mary [name] has one daughter (age 46) and one son (age 85). She has three brothers and one sister, ranging in age from 83 to 74. None of these family members have had cancer.  Mary [name] mother died at the age of 2 and did not have cancer. Mary [name] had two maternal aunts and two maternal uncles, all who died when they were older than 55. Her maternal grandparents died when they were in their late 23s. She notes that her maternal grandfather was hospitalized for rectal bleeding prior to his death, although she is not sure if this was due to cancer. There are no known diagnoses of cancer on the maternal side of the family.  Mary [name] father died at the age of 16 and did not have cancer, although he had congestive heart failure and  dementia. She had three paternal aunts and two paternal uncles. One aunt had breast cancer diagnosed in her early 62s. Her other aunts and uncles died in their 8s and did not have cancer. Her paternal grandmother died in her late 25s and had multiple medical problems but not cancer. Her maternal grandfather died at the age of 70 and had dementia. There are no other known diagnoses of cancer on the paternal side of the family.  Mary [name] is unaware of previous family history of genetic testing for hereditary cancer risks. Her ancestry is unknown. There is no reported Ashkenazi Jewish ancestry. There is no known consanguinity.  GENETIC COUNSELING ASSESSMENT: Mary [name] is a 69 y.o. female with a personal  history of pancreatic cancer and a family history of breast cancer, which is somewhat suggestive of a hereditary cancer syndrome and predisposition to cancer. We, therefore, discussed and recommended the following at today's visit.   DISCUSSION:  We discussed that, in general, most cancer is not inherited in families, but instead is sporadic or familial. Sporadic cancers occur by chance and typically happen at older ages (>50 years) as this type of cancer is caused by genetic changes acquired during an individual's lifetime. Some families have more cancers than would be expected by chance; however, the ages or types of cancer are not consistent with a known genetic mutation or known genetic mutations have been ruled out. This type of familial cancer is thought to be due to a combination of multiple genetic, environmental, hormonal, and lifestyle factors. While this combination of factors likely increases the risk of cancer, the exact source of this risk is not currently identifiable or testable.    We discussed that approximately 5-10% of cancer is hereditary, meaning that it is due to a mutation in a single gene that is passed down from generation to generation in a family. Most hereditary cases of  pancreatic cancer are associated with the BRCA1 and BRCA2 genes, although there are other genes that can be associated with hereditary pancreatic cancer syndromes.  We discussed that testing is beneficial for several reasons, including identifying whether potential treatment options such as PARP inhibitors would be beneficial in the future, knowing about other cancer risks, identifying potential screening and risk-reduction options that may be appropriate, and to understand if other family members could be at risk for cancer and allow them to undergo genetic testing.  We reviewed the characteristics, features and inheritance patterns of hereditary cancer syndromes. We also discussed genetic testing, including the appropriate family members to test, the process of testing, insurance coverage and turn-around-time for results. We discussed the implications of a negative, positive and/or variant of uncertain significant result. We recommended Mary [name] pursue genetic testing for the [facility] Common Hereditary Cancers panel.   The Common Hereditary Cancers Panel offered by [facility] includes sequencing and/or deletion duplication testing of the following 48 genes: APC, ATM, AXIN2, BARD1, BMPR1A, BRCA1, BRCA2, BRIP1, CDH1, CDK4, CDKN2A (p14ARF), CDKN2A (p16INK4a), CHEK2, CTNNA1, DICER1, EPCAM (Deletion/duplication testing only), GREM1 (promoter region deletion/duplication testing only), KIT, MEN1, MLH1, MSH2, MSH3, MSH6, MUTYH, NBN, NF1, NHTL1, PALB2, PDGFRA, PMS2, POLD1, POLE, PTEN, RAD50, RAD51C, RAD51D, RNF43, SDHB, SDHC, SDHD, SMAD4, SMARCA4. STK11, TP53, TSC1, TSC2, and VHL.  The following genes are evaluated for sequence changes only: SDHA and HOXB13 c.251G>A variant only.   Based on Mary [name]'s personal history of pancreatic cancer, she meets medical criteria for genetic testing. Despite that she meets criteria, she may still have an out of pocket cost. We discussed that if her out of pocket cost for testing  is over $100, the laboratory will call and confirm whether she wants to proceed with testing.  If the out of pocket cost of testing is less than $100 she will be billed by the genetic testing laboratory.   PLAN: After considering the risks, benefits, and limitations, Mary [name] provided informed consent to pursue genetic testing and the blood sample was sent to [facility] for analysis of the Common Hereditary Cancers Panel. Results should be available within approximately two-three weeks' time, at which point they will be disclosed by telephone to Mary [name], as will any additional recommendations warranted by these results. Mary [name] will receive a summary of  her genetic counseling visit and a copy of her results once available. This information will also be available in Epic.   Mary [name] questions were answered to her satisfaction today. Our contact information was provided should additional questions or concerns arise. Thank you for the referral and allowing [place] to share in the care of your patient.   Clint Guy, [place], [facility] Licensed, Certified Dispensing [email or web address] Phone: [phone]  The patient was seen for a total of 35 minutes in face-to-face genetic counseling.  This patient was discussed with Drs. Magrinat, Lindi Adie and/or Burr Medico who agrees with the above.    _______________________________________________________________________ For Office Staff:  Number of people involved in session: 2 Was an Intern/ student involved with case: no

## 2019-12-29 NOTE — Progress Notes (Signed)
START ON PATHWAY REGIMEN - Pancreatic Adenocarcinoma     A cycle is every 28 days:     Nab-paclitaxel (protein bound)      Gemcitabine   **Always confirm dose/schedule in your pharmacy ordering system**  Patient Characteristics: Preoperative (Clinical Staging), Borderline Resectable, PS = 0,1 Therapeutic Status: Preoperative (Clinical Staging) AJCC T Category: Staged < 8th Ed. AJCC N Category: Staged < 8th Ed. Resectability Status: Borderline Resectable AJCC M Category: Staged < 8th Ed. AJCC 8 Stage Grouping: Staged < 8th Ed. ECOG Performance Status: 1 Intent of Therapy: Curative Intent, Discussed with Patient

## 2019-12-29 NOTE — Research (Signed)
EXACT SCIENCES 2018-01 STUDY; BLOOD SAMPLE COLLECTION TO EVALUATE BIOMARKERS IN SUBJECTS WITH UNTREATED SOLID TUMORS.   12/29/2019 1030  CONSENT: Met with patient and spouse in conference room for 30 minutes. Study eligibility was double checked by Clinical Research Nurse Foye Spurling, RN and she confirms patient meets all inclusion and none of the exclusion criteria to be enrolled in the [id number] study.  Dr. Benay Spice agrees patient meets eligibility criteria and signed eligibility form. Consent and HIPAAs forms were reviewed in entirety, including blood draw procedure, data collection questions, alternatives to participation, and potential risks and benefits. Patient states she does not have any questions and verbalizes desire to voluntarily participate. Consent and HIPAA forms version 3.0 (IRB approved 07/15/2018) were both signed and signed copies provided to patient along with my business card should questions arise in the future. Release of Information consent (form# [id number], revised 03/19/02) was also signed to obtain copies of original labs from PCP. Once consent was signed and copies provided, the family cancer history and smoking/tobacco use history interview were completed.  HISTORY: Patient denies familial cancer history in first degree relatives but reports her aunt had breast cancer. She denies any tobacco or alcohol use at any point. History worksheet was completed per patient interview responses.   SPECIMEN COLLECTION: Since she received IV contrast this morning for a rescheduled CT exam, specimen collection will be deferred until her next appointment (to be determined). Patient and spouse were advised that the $50 gift card will be provided after specimen collection and both verbalize understanding.   Patient was thanked for her time and participation in this study and advised that I will follow-up with a specimen collection date and time.  Dionne Bucy. Sharlett Iles, BSN, RN, CIC 12/29/2019 2:58  PM

## 2019-12-29 NOTE — Progress Notes (Signed)
[place] OFFICE PROGRESS NOTE   Diagnosis: Pancreas cancer  INTERVAL HISTORY:   [name] returns as scheduled.  She reports intermittent nausea.  Abdominal pain is relieved with hydrocodone.  She has anorexia and altered taste.  She stays in bed or on the sofa for part of the day.  She is ambulatory in the home.  Objective:  Vital signs in last 24 hours:  Blood pressure 129/78, pulse 94, temperature 97.8 F (36.6 C), temperature source Temporal, resp. rate 18, height 5' 1"  (1.549 m), weight 127 lb 6.4 oz (57.8 kg), SpO2 98 %.    HEENT: Sclera anicteric Cardio: Regular rate and rhythm GI: Nontender, no mass, no hepatomegaly Vascular: No leg edema   Lab Results:  Lab Results  Component Value Date   WBC 7.9 12/29/2019   HGB 12.7 12/29/2019   HCT 39.2 12/29/2019   MCV 92.2 12/29/2019   PLT 362 12/29/2019   NEUTROABS 5.2 12/29/2019    CMP  Lab Results  Component Value Date   NA 139 12/29/2019   K 4.3 12/29/2019   CL 97 (L) 12/29/2019   CO2 30 12/29/2019   GLUCOSE 123 (H) 12/29/2019   BUN 17 12/29/2019   CREATININE 1.00 12/29/2019   CALCIUM 9.7 12/29/2019   PROT 7.6 12/29/2019   ALBUMIN 3.3 (L) 12/29/2019   AST 54 (H) 12/29/2019   ALT 72 (H) 12/29/2019   ALKPHOS 173 (H) 12/29/2019   BILITOT 0.9 12/29/2019   GFRNONAA 58 (L) 12/29/2019   GFRAA >60 12/29/2019    No results found for: CEA1  No results found for: INR  Imaging:  CT ABDOMEN PELVIS W CONTRAST  Result Date: 12/29/2019 CLINICAL DATA:  Pancreatic adenocarcinoma staging workup. EXAM: CT ABDOMEN AND PELVIS WITH CONTRAST TECHNIQUE: Multidetector CT imaging of the abdomen and pelvis was performed using the standard protocol following bolus administration of intravenous contrast. CONTRAST:  1109m OMNIPAQUE IOHEXOL 300 MG/ML  SOLN COMPARISON:  Outside [facility]contrast CT [facility] FINDINGS: Lower chest: Cylindrical bronchiectasis in both lower lobes. Mild scarring  along the lower margin of the left major fissure. Subsegmental atelectasis or scarring in the posterior basal segments of both lower lobes. Descending thoracic aortic atherosclerotic calcification. Hepatobiliary: 0.9 by 0.5 cm lesion in segment 8 of the liver on image 21/7, technically nonspecific due to small size. Expandable biliary stent extends from the common hepatic duct into the margin of the duodenum. Small amount of pneumobilia but no current biliary dilatation. Upper normal thickness of the gallbladder. Pancreas: A hypoenhancing mass in the pancreas highly suspicious for adenocarcinoma is present with the following characteristics: Size: 3.1 by 2.5 by 1.9 cm Location: Pancreatic body and head Characterization: Solid Enhancement: Diffuse hypoenhancement on arterial phase images, with a more heterogeneous appearance on delayed images Other Characteristics: Mild to moderate dilatation of the dorsal pancreatic duct in the pancreatic tail. Local extent of mass: The mass abuts the posterior margin of the duodenal bulb on image 41/2. Vascular Involvement: The mass extends along an approximately 200 degree arc of the anterior margin of the distal portal vein with some narrowing of the portal vein shown on images 38-39 of series 7 in the vicinity of the mass. The tumor appears to envelop the right gastric artery for example on images 38 through 44 of series 2. Variant hepatic artery anatomy: No Bile Duct Involvement: The mass abuts and approximately 90 degree arc of the biliary stent. Variant biliary anatomy: Not observed Adjacent Nodes: Right eccentric porta  hepatis node 1.1 cm in short axis on image 36/2. Peripancreatic node 0.6 cm in short axis on image 31/2. Omental/Peritoneal Disease: Not observed Distant Metastases: Indeterminate small liver lesion may well be benign in segment 8 but is technically nonspecific. Other: Low-level edema around the pancreatic head. Spleen: Unremarkable Adrenals/Urinary Tract:  Right mid kidney cyst 1.2 by 1.0 cm on image 48/7. Otherwise unremarkable. Stomach/Bowel: Short focus of proximal jejuno-jejunal intussusception about 1.0 cm in length on image 51/8. Mildly dilated proximal jejunum with semi solid contents. Mildly mobile cecum in the right upper quadrant. Vascular/Lymphatic: Aortoiliac atherosclerotic vascular disease. Reproductive: Bilateral tubal ligation clips noted. Otherwise unremarkable. Other: No supplemental non-categorized findings. Musculoskeletal: Degenerative disc disease and mild degenerative endplate findings at Y1-9 especially eccentric to the right. Mild dextroconvex lumbar scoliosis. IMPRESSION: 1. 3.1 cm hypoenhancing mass in the pancreas body highly suspicious for adenocarcinoma. The mass extends along an approximately 200 degree arc of the anterior margin of the distal portal vein with some narrowing of the portal vein. The mass envelops the right gastric artery. 2. Mildly enlarged porta hepatis lymph node, nonspecific due to local extent of the mass. 3. Small liver lesion in segment 8 is technically nonspecific due to small size. 4. Short focus of proximal jejuno-jejunal intussusception about 1.0 cm in length. 5. Cylindrical bronchiectasis in both lower lobes. Aortic Atherosclerosis (ICD10-I70.0). Electronically Signed   By: Van Clines M.D.   On: 12/29/2019 11:15    Medications: I have reviewed the patient's current medications.   Assessment/Plan: 1. 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   -12/02/19 MRI of the abdomen - showed a 18 x17 mm mass in the head of the pancreas with pancreatic duct and CBD dilatation. There are small cysts noted in the right liver lobe, otherwise no evidence of hepatic mets or other metastatic disease in the abdomen.  -CA  19-9 1349   -12/11/19 EUS/FNA per Dr. Ardis Hughs - irregular hypoechoic 3.4 cm mass in the pancreatic head with a 1.5 cm segment of invasion into the portal vein. There was no peripancreatic adenopathy.   -Pathology showed malignant cells consistent with adenocarcinoma  -12/11/19 ERCP with placement of metallic biliary stent of malignant stricture and obstruction of the distal CBD.              -CT chest 12/24/2019, multiple nonspecific lung nodules, indeterminate low-attenuation lesion in segment 7 of the liver, enlarged porta hepatis node, hypodense nodule at the isthmus of the thyroid gland             -CT abdomen/pelvis 12/29/2019, 3.1 cm pancreas body mass extending along 200 degrees of the anterior distal portal vein with narrowing of the portal vein, mass involves the right gastric artery, mildly enlarged porta hepatis node, segment 8 liver lesion-nonspecific   2. Abdominal discomfort, nausea, constipation, secondary to #1   -s/p ERCP and stenting of malignant stricture of distal CBD   -We reviewed bowel regimen and pain control. Given tramadol PRN on 12/15/19   3. Transaminitis and hyperbilirubinemia, secondary to #1  12/04/19 Bili 3.3, ALT 538 AST 527 and ALK PHOS 414.  -12/11/19 Tbili was elevated to 6.4   -juandice resolved after ERCP and stenting   4. Anorexia and weight loss  -presented with 15 lbs weight loss, secondary to #1  -she has been referred to  dietician   5. Genetics   -Her family history is not strongly suggestive of an inheritable cancer syndrome.   -Due to her personal history of pancreas cancer, she qualifies for genetics, r/o BRCA mutation.   -If positive, she would be a candidate for PARP inhibitor in the future     Disposition: Ms. Waitman has been diagnosed with pancreas cancer.  She appears to have borderline resectable disease unless the lung and liver lesions are indicative of metastases.  I discussed treatment options with Ms. Carlile and her husband.  She has a  borderline performance status.  She is not a candidate for FOLFIRINOX in her current condition.  I recommend treatment with gemcitabine/Abraxane.  I will refer her to Dr. Barry Dienes to discuss surgical options.  Her case will be presented at the GI tumor conference.  We reviewed potential toxicities associated with the gemcitabine/Abraxane regimen including the chance for nausea, alopecia, and hematologic toxicity.  We discussed the rash, fever, and pneumonitis seen with gemcitabine.  We discussed the allergic reaction and neuropathy associated with Abraxane.  She agrees to proceed.  She will be referred for a chemotherapy teaching class.  The plan is to begin gemcitabine/Abraxane on 01/07/2020.  We will refer her for an MRI of the liver pending the tumor conference discussion.  I will refer her for Port-A-Cath placement.  Betsy Coder, MD  12/29/2019  12:07 PM

## 2019-12-29 NOTE — Progress Notes (Signed)
Met with patient and her husband Mary [name] during their follow up visit with Dr. Benay Spice s/p having CT of chest on 4/9/20201 and CT abdomen and pelvis done today.  The results were reviewed by Dr. Benay Spice.  I called [facility] to follow up on our referral to Dr. Barry Dienes on 12/16/2019.  I was given an appointment for 4/26 at 9:45 to arrive by 9:15.  The patient's husband was given this information, their physical address and telephone number.  Both verbalized an understanding of the plan to have a port a cath placed, chemotherapy education class and to start Gemcitabine and Abraxane every 2 weeks beginning 01/07/2020.  We will also schedule a follow up appointment the on that date as well.  I transported the patient to [facility]etics for their scheduled appointment.

## 2019-12-29 NOTE — Telephone Encounter (Signed)
Left message for daughter Lezlie Lye [phone]).  FMLA form is ready for pick up.  Request return call with fax number if would like form faxed to employer [employer] de[employer] before pick up.  No fax number provided on form or [place] cover sheet.

## 2019-12-30 ENCOUNTER — Inpatient Hospital Stay: Payer: Medicare Other

## 2019-12-30 LAB — CANCER ANTIGEN 19-9: CA 19-9: 2298 U/mL — ABNORMAL HIGH (ref 0–35)

## 2019-12-30 NOTE — Progress Notes (Signed)
Nutrition Follow-up:  Patient with pancreatic mass.  Placement of biliary stent was done on 12/11/19.  Planning to start chemotherapy.    Spoke with patient and husband via phone for nutrition follow-up.  Patient reports that after taking remeron slept really good and has felt hungry.  Has eaten butter toast with jelly for breakfast.  Last night ate few bites of steak and potato. This am ate small bowl of cereal and orange juice.  Patient has been drinking boost very high calorie shake (530 calorie 22 g protein).  2-3 times per day.  Husband reports patient is able to eat more when has more regular bowel movements and when pain is controlled.  Patient has been taking colace and miralax to help with constipation.  Husband reports has been tracking calories and patient gets varied amounts of calories (334 kcals yesterday as had testing and MD appts), ~1000 calories, some days 2000 calories.      Medications: bentyl, remeron,   Labs: glucose 123  Anthropometrics:   Weight is 127 lb 6.4 oz on 4/13 but typically at home 124 lb (wearing nightgown, no shoes) increased from 125 lb on 3/23   NUTRITION DIAGNOSIS: Inadequate oral intake improving   INTERVENTION:  Continue bowel regimen to help relieve constipation and allow patient to increase calories and protien Discussed ways to add calories and protein to current meal plan. Continued intake of boost very high calorie 2-3 times per day (1060-1590 calories, 44-66 g protein) Encouraged small frequent meals.  Patient has contact information    MONITORING, EVALUATION, GOAL: patient will consume adequate calories and protein to prevent weight loss   NEXT VISIT: to be determined with treatment  Yajaira Doffing B. Zenia Resides, [place], [place] Registered Dietitian [phone] (pager)

## 2019-12-30 NOTE — Progress Notes (Signed)
Called and spoke to patient and her husband [name] with appointments for port placement and chemo education class.  Port placement is Monday 4/19 at [facility] to arrive no later than 10:30 for 12:00 procedure, NPO after midnight and must have a driver.  Chemo Education class is on 4/21 at 10:00 via telephone.  They both verbalized an understanding.

## 2019-12-31 ENCOUNTER — Other Ambulatory Visit: Payer: Self-pay | Admitting: Radiology

## 2019-12-31 ENCOUNTER — Other Ambulatory Visit: Payer: Self-pay

## 2019-12-31 ENCOUNTER — Other Ambulatory Visit: Payer: Self-pay | Admitting: Nurse Practitioner

## 2019-12-31 DIAGNOSIS — K8689 Other specified diseases of pancreas: Secondary | ICD-10-CM

## 2019-12-31 MED ORDER — LIDOCAINE-PRILOCAINE 2.5-2.5 % EX CREA: 1.0000 "application " | TOPICAL_CREAM | CUTANEOUS | 0 refills | Status: AC | PRN

## 2019-12-31 MED ORDER — PROCHLORPERAZINE MALEATE 10 MG PO TABS: 10.0000 mg | ORAL_TABLET | Freq: Four times a day (QID) | ORAL | 0 refills | Status: AC | PRN

## 2020-01-01 DIAGNOSIS — C250 Malignant neoplasm of head of pancreas: Secondary | ICD-10-CM

## 2020-01-01 NOTE — Research (Signed)
[employer] 2018-01 STUDY; BLOOD SAMPLE COLLECTION TO EVALUATE BIOMARKERS IN SUBJECTS WITH UNTREATED SOLID TUMORS.   01/01/2020 13:42PM  TELEPHONE CALL: Spoke with patient and spouse via telephone for 5 minutes. Advised [employer] labs will be drawn on 01/07/20, prior to initiating chemotherapy treatment: both verbalize understanding. Will confirm continued eligibility prior to specimen draw on 01/07/20. Consent has already been signed (12/29/2019) and family history interview has already been completed. Will follow.  Dionne Bucy. Sharlett Iles, BSN, RN, CIC 01/01/2020 1:56 PM

## 2020-01-01 NOTE — Progress Notes (Signed)
Pharmacist Chemotherapy Monitoring - Initial Assessment    Anticipated start date: 01/07/20   Regimen:  . Are orders appropriate based on the patient's diagnosis, regimen, and cycle? Yes . Does the plan date match the patient's scheduled date? Yes . Is the sequencing of drugs appropriate? Yes . Are the premedications appropriate for the patient's regimen? Yes . Prior Authorization for treatment is: Not Started o If applicable, is the correct biosimilar selected based on the patient's insurance? not applicable  Organ Function and Labs: Marland Kitchen Are dose adjustments needed based on the patient's renal function, hepatic function, or hematologic function? Yes . Are appropriate labs ordered prior to the start of patient's treatment? Yes . Other organ system assessment, if indicated: N/A . The following baseline labs, if indicated, have been ordered: N/A  Dose Assessment: . Are the drug doses appropriate? Yes . Are the following correct: o Drug concentrations Yes o IV fluid compatible with drug Yes o Administration routes Yes o Timing of therapy Yes . If applicable, does the patient have documented access for treatment and/or plans for port-a-cath placement? yes . If applicable, have lifetime cumulative doses been properly documented and assessed? not applicable  Toxicity Monitoring/Prevention: . The patient has the following take home antiemetics prescribed: Ondansetron and Prochlorperazine           . The patient has the following take home medications prescribed: N/A . Medication allergies and previous infusion related reactions, if applicable, have been reviewed and addressed. Yes . The patient's current medication list has been assessed for drug-drug interactions with their chemotherapy regimen. no significant drug-drug interactions were identified on review.  Order Review: . Are the treatment plan orders signed? No . Is the patient scheduled to see a provider prior to their treatment?  Yes  I verify that I have reviewed each item in the above checklist and answered each question accordingly.  Kennith Center P 01/01/2020 1:39 PM

## 2020-01-03 ENCOUNTER — Other Ambulatory Visit: Payer: Self-pay | Admitting: Oncology

## 2020-01-04 ENCOUNTER — Ambulatory Visit (HOSPITAL_COMMUNITY)
Admission: RE | Admit: 2020-01-04 | Discharge: 2020-01-04 | Disposition: A | Discharge status: Home / Self Care | Payer: Medicare Other | Source: Ambulatory Visit | Attending: Oncology | Admitting: Oncology

## 2020-01-04 ENCOUNTER — Encounter (HOSPITAL_COMMUNITY): Payer: Self-pay

## 2020-01-04 ENCOUNTER — Ambulatory Visit (HOSPITAL_COMMUNITY): Payer: Medicare Other

## 2020-01-04 ENCOUNTER — Other Ambulatory Visit: Payer: Self-pay

## 2020-01-04 DIAGNOSIS — J45909 Unspecified asthma, uncomplicated: Secondary | ICD-10-CM | POA: Insufficient documentation

## 2020-01-04 DIAGNOSIS — Z880 Allergy status to penicillin: Secondary | ICD-10-CM | POA: Diagnosis not present

## 2020-01-04 DIAGNOSIS — E7800 Pure hypercholesterolemia, unspecified: Secondary | ICD-10-CM | POA: Diagnosis not present

## 2020-01-04 DIAGNOSIS — Q211 Atrial septal defect: Secondary | ICD-10-CM | POA: Diagnosis not present

## 2020-01-04 DIAGNOSIS — I10 Essential (primary) hypertension: Secondary | ICD-10-CM | POA: Insufficient documentation

## 2020-01-04 DIAGNOSIS — Z885 Allergy status to narcotic agent status: Secondary | ICD-10-CM | POA: Insufficient documentation

## 2020-01-04 DIAGNOSIS — Z882 Allergy status to sulfonamides status: Secondary | ICD-10-CM | POA: Insufficient documentation

## 2020-01-04 DIAGNOSIS — Z7982 Long term (current) use of aspirin: Secondary | ICD-10-CM | POA: Diagnosis not present

## 2020-01-04 DIAGNOSIS — E039 Hypothyroidism, unspecified: Secondary | ICD-10-CM | POA: Diagnosis not present

## 2020-01-04 DIAGNOSIS — Z79899 Other long term (current) drug therapy: Secondary | ICD-10-CM | POA: Diagnosis not present

## 2020-01-04 DIAGNOSIS — C250 Malignant neoplasm of head of pancreas: Secondary | ICD-10-CM

## 2020-01-04 DIAGNOSIS — C259 Malignant neoplasm of pancreas, unspecified: Secondary | ICD-10-CM | POA: Diagnosis present

## 2020-01-04 HISTORY — PX: IR IMAGING GUIDED PORT INSERTION: IMG5740

## 2020-01-04 MED ORDER — MIDAZOLAM HCL 2 MG/2ML IJ SOLN
INTRAMUSCULAR | Status: AC
  Filled 2020-01-04: qty 2

## 2020-01-04 MED ORDER — FENTANYL CITRATE (PF) 100 MCG/2ML IJ SOLN
INTRAMUSCULAR | Status: AC | PRN
  Administered 2020-01-04: 50 ug via INTRAVENOUS

## 2020-01-04 MED ORDER — HEPARIN SOD (PORK) LOCK FLUSH 100 UNIT/ML IV SOLN
INTRAVENOUS | Status: AC | PRN
  Administered 2020-01-04: 500 [IU] via INTRAVENOUS

## 2020-01-04 MED ORDER — LIDOCAINE-EPINEPHRINE 1 %-1:100000 IJ SOLN
INTRAMUSCULAR | Status: AC
  Filled 2020-01-04: qty 1

## 2020-01-04 MED ORDER — HEPARIN SOD (PORK) LOCK FLUSH 100 UNIT/ML IV SOLN
INTRAVENOUS | Status: AC
  Filled 2020-01-04: qty 5

## 2020-01-04 MED ORDER — MIDAZOLAM HCL 2 MG/2ML IJ SOLN
INTRAMUSCULAR | Status: AC | PRN
  Administered 2020-01-04: 1 mg via INTRAVENOUS

## 2020-01-04 MED ORDER — LIDOCAINE HCL 1 % IJ SOLN
INTRAMUSCULAR | Status: AC | PRN
  Administered 2020-01-04: 5 mL

## 2020-01-04 MED ORDER — VANCOMYCIN HCL IN DEXTROSE 1-5 GM/200ML-% IV SOLN
INTRAVENOUS | Status: AC
  Administered 2020-01-04: 1000 mg via INTRAVENOUS
  Filled 2020-01-04: qty 200

## 2020-01-04 MED ORDER — FENTANYL CITRATE (PF) 100 MCG/2ML IJ SOLN
INTRAMUSCULAR | Status: AC
  Filled 2020-01-04: qty 2

## 2020-01-04 MED ORDER — SODIUM CHLORIDE 0.9 % IV SOLN: INTRAVENOUS | Status: DC

## 2020-01-04 MED ORDER — VANCOMYCIN HCL IN DEXTROSE 1-5 GM/200ML-% IV SOLN: 1000.0000 mg | INTRAVENOUS | Status: AC

## 2020-01-04 NOTE — Discharge Instructions (Addendum)
Implanted Port Insertion, Care After This sheet gives you information about how to care for yourself after your procedure. Your health care provider may also give you more specific instructions. If you have problems or questions, contact your health care provider. What can I expect after the procedure? After the procedure, it is common to have:  Discomfort at the port insertion site.  Bruising on the skin over the port. This should improve over 3-4 days. Follow these instructions at home: Port care  After your port is placed, you will get a manufacturer's information card. The card has information about your port. Keep this card with you at all times.  Take care of the port as told by your health care provider. Ask your health care provider if you or a family member can get training for taking care of the port at home. A home health care nurse may also take care of the port.  Make sure to remember what type of port you have. Incision care      Follow instructions from your health care provider about how to take care of your port insertion site. Make sure you: ? Wash your hands with soap and water before and after you change your bandage (dressing). If soap and water are not available, use hand sanitizer. ? Change your dressing as told by your health care provider. ? Leave stitches (sutures), skin glue, or adhesive strips in place. These skin closures may need to stay in place for 2 weeks or longer. If adhesive strip edges start to loosen and curl up, you may trim the loose edges. Do not remove adhesive strips completely unless your health care provider tells you to do that.  Check your port insertion site every day for signs of infection. Check for: ? Redness, swelling, or pain. ? Fluid or blood. ? Warmth. ? Pus or a bad smell. Activity  Return to your normal activities as told by your health care provider. Ask your health care provider what activities are safe for you.  Do not  lift anything that is heavier than 10 lb (4.5 kg), or the limit that you are told, until your health care provider says that it is safe. General instructions  Take over-the-counter and prescription medicines only as told by your health care provider.  Do not take baths, swim, or use a hot tub until your health care provider approves. Ask your health care provider if you may take showers. You may only be allowed to take sponge baths.  Do not drive for 24 hours if you were given a sedative during your procedure.  Wear a medical alert bracelet in case of an emergency. This will tell any health care providers that you have a port.  Keep all follow-up visits as told by your health care provider. This is important. Contact a health care provider if:  You cannot flush your port with saline as directed, or you cannot draw blood from the port.  You have a fever or chills.  You have redness, swelling, or pain around your port insertion site.  You have fluid or blood coming from your port insertion site.  Your port insertion site feels warm to the touch.  You have pus or a bad smell coming from the port insertion site. Get help right away if:  You have chest pain or shortness of breath.  You have bleeding from your port that you cannot control. Summary  Take care of the port as told by your health   care provider. Keep the manufacturer's information card with you at all times.  Change your dressing as told by your health care provider.  Contact a health care provider if you have a fever or chills or if you have redness, swelling, or pain around your port insertion site.  Keep all follow-up visits as told by your health care provider. This information is not intended to replace advice given to you by your health care provider. Make sure you discuss any questions you have with your health care provider. Document Revised: 04/01/2018 Document Reviewed: 04/01/2018 Elsevier Patient Education   2020 Elsevier Inc. Moderate Conscious Sedation, Adult Sedation is the use of medicines to promote relaxation and relieve discomfort and anxiety. Moderate conscious sedation is a type of sedation. Under moderate conscious sedation, you are less alert than normal, but you are still able to respond to instructions, touch, or both. Moderate conscious sedation is used during short medical and dental procedures. It is milder than deep sedation, which is a type of sedation under which you cannot be easily woken up. It is also milder than general anesthesia, which is the use of medicines to make you unconscious. Moderate conscious sedation allows you to return to your regular activities sooner. Tell a health care provider about:  Any allergies you have.  All medicines you are taking, including vitamins, herbs, eye drops, creams, and over-the-counter medicines.  Use of steroids (by mouth or creams).  Any problems you or family members have had with sedatives and anesthetic medicines.  Any blood disorders you have.  Any surgeries you have had.  Any medical conditions you have, such as sleep apnea.  Whether you are pregnant or may be pregnant.  Any use of cigarettes, alcohol, marijuana, or street drugs. What are the risks? Generally, this is a safe procedure. However, problems may occur, including:  Getting too much medicine (oversedation).  Nausea.  Allergic reaction to medicines.  Trouble breathing. If this happens, a breathing tube may be used to help with breathing. It will be removed when you are awake and breathing on your own.  Heart trouble.  Lung trouble. What happens before the procedure? Staying hydrated Follow instructions from your health care provider about hydration, which may include:  Up to 2 hours before the procedure - you may continue to drink clear liquids, such as water, clear fruit juice, black coffee, and plain tea. Eating and drinking restrictions Follow  instructions from your health care provider about eating and drinking, which may include:  8 hours before the procedure - stop eating heavy meals or foods such as meat, fried foods, or fatty foods.  6 hours before the procedure - stop eating light meals or foods, such as toast or cereal.  6 hours before the procedure - stop drinking milk or drinks that contain milk.  2 hours before the procedure - stop drinking clear liquids. Medicine Ask your health care provider about:  Changing or stopping your regular medicines. This is especially important if you are taking diabetes medicines or blood thinners.  Taking medicines such as aspirin and ibuprofen. These medicines can thin your blood. Do not take these medicines before your procedure if your health care provider instructs you not to.  Tests and exams  You will have a physical exam.  You may have blood tests done to show: ? How well your kidneys and liver are working. ? How well your blood can clot. General instructions  Plan to have someone take you home from the   hospital or clinic.  If you will be going home right after the procedure, plan to have someone with you for 24 hours. What happens during the procedure?  An IV tube will be inserted into one of your veins.  Medicine to help you relax (sedative) will be given through the IV tube.  The medical or dental procedure will be performed. What happens after the procedure?  Your blood pressure, heart rate, breathing rate, and blood oxygen level will be monitored often until the medicines you were given have worn off.  Do not drive for 24 hours. This information is not intended to replace advice given to you by your health care provider. Make sure you discuss any questions you have with your health care provider. Document Revised: 08/16/2017 Document Reviewed: 12/24/2015 Elsevier Patient Education  2020 Elsevier Inc.  

## 2020-01-04 NOTE — H&P (Signed)
Chief Complaint: Patient was seen in consultation today for [facility] a cath placement at the request of Sherrill,Gary B  Referring Physician(s): Ladell Pier  Supervising Physician: Sandi Mariscal  Patient Status: [facility] - Out-pt  History of Present Illness: Mary [name] is a 69 y.o. female   Newly Dx Pancreatic cancer To start Chemotherapy Thurs this week  Follows with Dr Cristopher Peru cath placement today  Past Medical History:  Diagnosis Date  . Abdominal distention 12/07/2019  . 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 Asthma    "adult" asthma  . Atrial septal defect, sinus venosus 12/07/2019  . Common bile duct dilation 12/07/2019  . Complication of anesthesia    had trouble waking up on her first colonoscopy  . Dilated pancreatic duct 12/07/2019  . Dysrhythmia    palpitatations  . Elevated lipase 12/07/2019  . Essential hypertension 12/07/2019  . Family history of breast cancer   . History of kidney stones    on xray only  . Hypothyroidism 12/07/2019   "being watched" not on medications at this time  . Loss of weight 12/07/2019  . Nausea 12/07/2019  . Pancreatic mass 12/07/2019  . Pneumonia   . Pure hypercholesterolemia 12/07/2019    Past Surgical History:  Procedure Laterality Date  . ASD REPAIR, SINUS VENOSUS     at age 17   . BILIARY STENT PLACEMENT  12/11/2019   Procedure: BILIARY STENT PLACEMENT;  Surgeon: [name], MD;  Location: [facility] ENDOSCOPY;  Service: Endoscopy;;  . COLONOSCOPY    . ENDOSCOPIC RETROGRADE CHOLANGIOPANCREATOGRAPHY (ERCP) WITH PROPOFOL N/A 12/11/2019   Procedure: ENDOSCOPIC RETROGRADE CHOLANGIOPANCREATOGRAPHY (ERCP) WITH PROPOFOL;  Surgeon: [name], MD;  Location: [facility] ENDOSCOPY;  Service: Endoscopy;  Laterality: N/A;  . ESOPHAGOGASTRODUODENOSCOPY (EGD) WITH PROPOFOL N/A 12/11/2019   Procedure: ESOPHAGOGASTRODUODENOSCOPY (EGD) WITH PROPOFOL;  Surgeon: [name], MD;  Location: [facility] ENDOSCOPY;  Service: Endoscopy;  Laterality: N/A;  . FINE  NEEDLE ASPIRATION  12/11/2019   Procedure: FINE NEEDLE ASPIRATION (FNA) LINEAR;  Surgeon: [name], MD;  Location: [place];  Service: Endoscopy;;  . TONSILLECTOMY    . TUBAL LIGATION    . UPPER ESOPHAGEAL ENDOSCOPIC ULTRASOUND (EUS) N/A 12/11/2019   Procedure: UPPER ESOPHAGEAL ENDOSCOPIC ULTRASOUND (EUS);  Surgeon: [name], MD;  Location: [facility] ENDOSCOPY;  Service: Endoscopy;  Laterality: N/A;    Allergies: Ultram [tramadol], Penicillins, and Sulfa antibiotics  Medications: Prior to Admission medications   Medication Sig Start Date End Date Taking? Authorizing Provider  acetaminophen (TYLENOL) 500 MG tablet Take 500 mg by mouth every 6 (six) hours as needed for moderate pain or headache.    Yes [provider]  albuterol (VENTOLIN HFA) 108 (90 Base) MCG/ACT inhaler Inhale 2 puffs into the lungs every 4 (four) hours as needed for wheezing or shortness of breath.  08/18/19  Yes [provider]  atorvastatin (LIPITOR) 40 MG tablet Take 40 mg by mouth at bedtime.  09/10/19  Yes [provider]  calcium carbonate (TUMS EX) 750 MG chewable tablet Chew 750 mg by mouth daily as needed for heartburn.   Yes [provider]  dicyclomine (BENTYL) 10 MG capsule Take 1 capsule (10 mg total) by mouth 2 (two) times daily as needed for spasms. 12/24/19  Yes Alla Feeling, NP  docusate sodium (COLACE) 100 MG capsule Take 100 mg by mouth daily.   Yes [provider]  HYDROcodone-acetaminophen (NORCO/VICODIN) 5-325 MG tablet Take 1 tablet by mouth every 6 (six) hours as  needed for moderate pain. 12/15/19  Yes Alla Feeling, NP  losartan (COZAAR) 50 MG tablet Take 50 mg by mouth at bedtime.  11/29/19  Yes [provider]  mirtazapine (REMERON) 7.5 MG tablet Take 1 tablet (7.5 mg total) by mouth at bedtime. 12/24/19  Yes Alla Feeling, NP  OVER THE COUNTER MEDICATION Apply 1 application topically at bedtime as needed (foot burning). amish foot  cream   Yes [provider]  oxymetazoline (VICKS SINEX) 0.05 % nasal spray Place 1 spray into both nostrils 2 (two) times daily as needed for congestion.   Yes [provider]  polyethylene glycol (MIRALAX / GLYCOLAX) 17 g packet Take 17 g by mouth daily.   Yes [provider]  aspirin EC 81 MG tablet Take 81 mg by mouth at bedtime.     [provider]  lidocaine-prilocaine (EMLA) cream Apply 1 application topically as needed. 12/31/19   Ladell Pier, MD  magnesium hydroxide (MILK OF MAGNESIA) 400 MG/5ML suspension Take 15 mLs by mouth daily as needed for mild constipation.    [provider]  ondansetron (ZOFRAN-ODT) 8 MG disintegrating tablet Take 1 tablet (8 mg total) by mouth every 8 (eight) hours as needed for nausea or vomiting. 12/29/19   Ladell Pier, MD  prochlorperazine (COMPAZINE) 10 MG tablet Take 1 tablet (10 mg total) by mouth every 6 (six) hours as needed for nausea or vomiting. 12/31/19   Ladell Pier, MD     Family History  Problem Relation Age of Onset  . COPD Mother   . Dementia Father   . Diabetes Father   . Hypertension Father   . Alzheimer's disease Brother   . Breast cancer Paternal Aunt        dx. in her early 74s  . Diabetes Paternal Aunt   . Diabetes Paternal Uncle   . Other Maternal Grandfather        hospitalized for rectal bleeding before death  . Dementia Paternal Grandfather   . Dementia Paternal Uncle     Social History   Socioeconomic History  . Marital status: Married    Spouse name: Not on file  . Number of children: Not on file  . Years of education: Not on file  . Highest education level: Not on file  Occupational History  . Not on file  Tobacco Use  . Smoking status: Never Smoker  . Smokeless tobacco: Never Used  Substance and Sexual Activity  . Alcohol use: Never  . Drug use: Never  . Sexual activity: Not on file  Other Topics Concern  . Not on file  Social History Narrative  .  Not on file   Social Determinants of Health   Financial Resource Strain:   . Difficulty of Paying Living Expenses:   Food Insecurity:   . Worried About Charity fundraiser in the Last Year:   . Arboriculturist in the Last Year:   Transportation Needs:   . Film/video editor (Medical):   Marland Kitchen Lack of Transportation (Non-Medical):   Physical Activity:   . Days of Exercise per Week:   . Minutes of Exercise per Session:   Stress:   . Feeling of Stress :   Social Connections:   . Frequency of Communication with Friends and Family:   . Frequency of Social Gatherings with Friends and Family:   . Attends Religious Services:   . Active Member of Clubs or Organizations:   .  Attends Archivist Meetings:   Marland Kitchen Marital Status:     Review of Systems: A 12 point ROS discussed and pertinent positives are indicated in the HPI above.  All other systems are negative.  Review of Systems  Constitutional: Positive for activity change, fatigue and unexpected weight change. Negative for fever.  Respiratory: Negative for shortness of breath.   Cardiovascular: Negative for chest pain.  Gastrointestinal: Negative for abdominal pain.  Neurological: Negative for weakness.  Psychiatric/Behavioral: Negative for behavioral problems and confusion.    Vital Signs: BP (!) 176/83   Pulse 100   Temp 97.9 F (36.6 C) (Skin)   Resp 15   Ht 5\' 1"  (1.549 m)   Wt 124 lb 12.8 oz (56.6 kg)   SpO2 97%   BMI 23.58 kg/m   Physical Exam Vitals reviewed.  Cardiovascular:     Rate and Rhythm: Normal rate and regular rhythm.     Heart sounds: Normal heart sounds.  Pulmonary:     Breath sounds: Normal breath sounds.  Abdominal:     Palpations: Abdomen is soft.  Musculoskeletal:        General: Normal range of motion.  Skin:    General: Skin is warm and dry.  Neurological:     Mental Status: She is alert and oriented to person, place, and time.  Psychiatric:        Behavior: Behavior normal.          Thought Content: Thought content normal.        Judgment: Judgment normal.     Imaging: CT Chest Wo Contrast  Result Date: 12/25/2019 CLINICAL DATA:  Pancreatic cancer staging. EXAM: CT CHEST WITHOUT CONTRAST TECHNIQUE: Multidetector CT imaging of the chest was performed following the standard protocol without IV contrast. COMPARISON:  None. FINDINGS: Cardiovascular: Heart size appears within normal limits. There is no pericardial effusion. Aortic atherosclerosis. Mediastinum/Nodes: There is a large hypodense nodule involving the isthmus of thyroid gland measuring 3.9 x 2.5 cm, image 22/2. Recommend thyroid US (ref: J Am Coll Radiol. 2015 Feb;12(2): 143-50). The trachea appears patent and is midline. Normal appearance of the esophagus. No mediastinal, hilar, axillary, or supraclavicular adenopathy. Lungs/Pleura: No pleural effusion identified. No airspace consolidation, atelectasis, or pneumothorax. Mild cylindrical bronchiectasis identified within both lower lobes. Within the central right upper lobe there is a small solid lung nodule measuring 5 mm, image 68/7. 2 mm right middle lobe lung nodule is noted, image 80/7. Also in the right middle lobe is a 2 mm nodule, image 80/7. Three additional less than 5 mm lung nodules are noted in the right middle lobe. Left lower lobe lung nodule measures 6 mm, image 75/7. Upper Abdomen: Pneumobilia is identified with common bile duct stent in place. Low density structure within segment 7 is indeterminate measuring 1.1 x 0.7 cm, image 116/2. Enlarged porta hepatic lymph node measures 1.2 cm, image 141/2. Nonobstructing calculus identified within the upper pole of left kidney measuring 2 mm. Musculoskeletal: No chest wall mass or suspicious bone lesions identified. IMPRESSION: 1. Multiple small nonspecific pulmonary nodules are identified measuring up to 6 mm. Attention on follow-up imaging advised. 2. Indeterminate low-attenuation structure within segment 7 of the  liver. This can be better assessed with MRI without and with contrast material. 3. Enlarged porta hepatic lymph node concerning for metastatic adenopathy. 4. Large hypodense nodule involving the isthmus of thyroid gland. Recommend thyroid US (ref: J Am Coll Radiol. 2015 Feb;12(2): 143-50). Aortic Atherosclerosis (ICD10-I70.0). Electronically Signed  By: Kerby Moors M.D.   On: 12/25/2019 11:09   CT ABDOMEN PELVIS W CONTRAST  Result Date: 12/29/2019 CLINICAL DATA:  Pancreatic adenocarcinoma staging workup. EXAM: CT ABDOMEN AND PELVIS WITH CONTRAST TECHNIQUE: Multidetector CT imaging of the abdomen and pelvis was performed using the standard protocol following bolus administration of intravenous contrast. CONTRAST:  156mL OMNIPAQUE IOHEXOL 300 MG/ML  SOLN COMPARISON:  Outside noncontrast CT abdomen from 12/11/2019 from [facility]. FINDINGS: Lower chest: Cylindrical bronchiectasis in both lower lobes. Mild scarring along the lower margin of the left major fissure. Subsegmental atelectasis or scarring in the posterior basal segments of both lower lobes. Descending thoracic aortic atherosclerotic calcification. Hepatobiliary: 0.9 by 0.5 cm lesion in segment 8 of the liver on image 21/7, technically nonspecific due to small size. Expandable biliary stent extends from the common hepatic duct into the margin of the duodenum. Small amount of pneumobilia but no current biliary dilatation. Upper normal thickness of the gallbladder. Pancreas: A hypoenhancing mass in the pancreas highly suspicious for adenocarcinoma is present with the following characteristics: Size: 3.1 by 2.5 by 1.9 cm Location: Pancreatic body and head Characterization: Solid Enhancement: Diffuse hypoenhancement on arterial phase images, with a more heterogeneous appearance on delayed images Other Characteristics: Mild to moderate dilatation of the dorsal pancreatic duct in the pancreatic tail. Local extent of mass: The mass abuts the posterior margin of  the duodenal bulb on image 41/2. Vascular Involvement: The mass extends along an approximately 200 degree arc of the anterior margin of the distal portal vein with some narrowing of the portal vein shown on images 38-39 of series 7 in the vicinity of the mass. The tumor appears to envelop the right gastric artery for example on images 38 through 44 of series 2. Variant hepatic artery anatomy: No Bile Duct Involvement: The mass abuts and approximately 90 degree arc of the biliary stent. Variant biliary anatomy: Not observed Adjacent Nodes: Right eccentric porta hepatis node 1.1 cm in short axis on image 36/2. Peripancreatic node 0.6 cm in short axis on image 31/2. Omental/Peritoneal Disease: Not observed Distant Metastases: Indeterminate small liver lesion may well be benign in segment 8 but is technically nonspecific. Other: Low-level edema around the pancreatic head. Spleen: Unremarkable Adrenals/Urinary Tract: Right mid kidney cyst 1.2 by 1.0 cm on image 48/7. Otherwise unremarkable. Stomach/Bowel: Short focus of proximal jejuno-jejunal intussusception about 1.0 cm in length on image 51/8. Mildly dilated proximal jejunum with semi solid contents. Mildly mobile cecum in the right upper quadrant. Vascular/Lymphatic: Aortoiliac atherosclerotic vascular disease. Reproductive: Bilateral tubal ligation clips noted. Otherwise unremarkable. Other: No supplemental non-categorized findings. Musculoskeletal: Degenerative disc disease and mild degenerative endplate findings at 075-GRM especially eccentric to the right. Mild dextroconvex lumbar scoliosis. IMPRESSION: 1. 3.1 cm hypoenhancing mass in the pancreas body highly suspicious for adenocarcinoma. The mass extends along an approximately 200 degree arc of the anterior margin of the distal portal vein with some narrowing of the portal vein. The mass envelops the right gastric artery. 2. Mildly enlarged porta hepatis lymph node, nonspecific due to local extent of the mass. 3.  Small liver lesion in segment 8 is technically nonspecific due to small size. 4. Short focus of proximal jejuno-jejunal intussusception about 1.0 cm in length. 5. Cylindrical bronchiectasis in both lower lobes. Aortic Atherosclerosis (ICD10-I70.0). Electronically Signed   By: Van Clines M.D.   On: 12/29/2019 11:15   DG ERCP  Result Date: 12/11/2019 CLINICAL DATA:  Biliary obstruction and pancreatic mass. EXAM: ERCP TECHNIQUE: Multiple spot images  obtained with the fluoroscopic device and submitted for interpretation post-procedure. COMPARISON:  Outside CT and MRI studies performed in [place], [place]. FINDINGS: Submitted imaging from the procedure performed with a C-arm demonstrates cannulation of the common bile duct with contrast injection demonstrating severe stricture of the distal common bile duct and dilatation of the proximal duct and intrahepatic ducts. The level of biliary stricture and obstruction was able to be crossed with a guidewire allowing placement of a self expanding metallic biliary stent that extends from the duodenum into the proximal common bile duct. IMPRESSION: Severe stricture and obstruction of the distal common bile duct treated by placement of a self expanding metallic biliary stent. 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 These images were submitted for radiologic interpretation only. Please see the procedural report for the amount of contrast and the fluoroscopy time utilized. Electronically Signed   By: Aletta Edouard M.D.   On: 12/11/2019 13:11   CT OUTSIDE FILMS BODY/ABD/PELVIS  Result Date: 12/11/2019 This examination belongs to an outside facility and is stored here for comparison purposes only.  Contact the originating outside institution for any associated report or interpretation.   Labs:  CBC: Recent Labs    12/29/19 1102  WBC 7.9  HGB 12.7  HCT 39.2  PLT 362    COAGS: No results for input(s):  INR, APTT in the last 8760 hours.  BMP: Recent Labs    12/11/19 0854 12/24/19 1619 12/29/19 1102  NA 142 139 139  K 4.6 4.4 4.3  CL 107 101 97*  CO2 24 27 30   GLUCOSE 125* 130* 123*  BUN 23 27* 17  CALCIUM 9.8 10.0 9.7  CREATININE 1.77* 0.95 1.00  GFRNONAA 29* >60 58*  GFRAA 34* >60 >60    LIVER FUNCTION TESTS: Recent Labs    12/11/19 0854 12/29/19 1102  BILITOT 6.4* 0.9  AST 622* 54*  ALT 540* 72*  ALKPHOS 540* 173*  PROT 7.3 7.6  ALBUMIN 3.7 3.3*    TUMOR MARKERS: No results for input(s): AFPTM, CEA, CA199, CHROMGRNA in the last 8760 hours.  Assessment and Plan:  Pancreatic cancer To start chemo Thurs this week For Port a cath placement in IR today Risks and benefits of image guided port-a-catheter placement was discussed with the patient including, but not limited to bleeding, infection, pneumothorax, or fibrin sheath development and need for additional procedures.  All of the patient's questions were answered, patient is agreeable to proceed. Consent signed and in chart.   Thank you for this interesting consult.  I greatly enjoyed meeting MADAI HERNANDEZGARCI and look forward to participating in their care.  A copy of this report was sent to the requesting provider on this date.  Electronically Signed: Lavonia Drafts, PA-C 01/04/2020, 11:17 AM   I spent a total of  30 Minutes   in face to face in clinical consultation, greater than 50% of which was counseling/coordinating care for [facility] placement

## 2020-01-04 NOTE — Procedures (Signed)
Pre Procedure Dx: Pancreatic cancer Post Procedural Dx: Same  Successful placement of right IJ approach port-a-cath with tip at the superior caval atrial junction. The catheter is ready for immediate use.  Estimated Blood Loss: Trace  Complications: None immediate.  Ronny Bacon, MD Pager #: [phone]

## 2020-01-04 NOTE — Progress Notes (Signed)
Discharge instructions reviewed with pt and her husband (via telephone) both voice understanding.  

## 2020-01-06 ENCOUNTER — Other Ambulatory Visit: Payer: Self-pay

## 2020-01-06 ENCOUNTER — Other Ambulatory Visit: Payer: Self-pay | Admitting: Nurse Practitioner

## 2020-01-06 ENCOUNTER — Inpatient Hospital Stay: Payer: Medicare Other

## 2020-01-07 ENCOUNTER — Inpatient Hospital Stay: Payer: Medicare Other

## 2020-01-07 ENCOUNTER — Other Ambulatory Visit: Payer: Self-pay

## 2020-01-07 ENCOUNTER — Encounter: Payer: Self-pay | Admitting: Nurse Practitioner

## 2020-01-07 ENCOUNTER — Inpatient Hospital Stay (HOSPITAL_BASED_OUTPATIENT_CLINIC_OR_DEPARTMENT_OTHER): Payer: Medicare Other | Admitting: Nurse Practitioner

## 2020-01-07 VITALS — HR 98

## 2020-01-07 VITALS — BP 138/75 | HR 102 | Temp 98.7°F | Resp 17 | Ht 61.0 in | Wt 126.3 lb

## 2020-01-07 DIAGNOSIS — C250 Malignant neoplasm of head of pancreas: Secondary | ICD-10-CM

## 2020-01-07 DIAGNOSIS — Z95828 Presence of other vascular implants and grafts: Secondary | ICD-10-CM

## 2020-01-07 DIAGNOSIS — Z5111 Encounter for antineoplastic chemotherapy: Secondary | ICD-10-CM | POA: Diagnosis not present

## 2020-01-07 LAB — CBC WITH DIFFERENTIAL (CANCER CENTER ONLY)
Abs Immature Granulocytes: 0.01 10*3/uL (ref 0.00–0.07)
Basophils Absolute: 0 10*3/uL (ref 0.0–0.1)
Basophils Relative: 0 %
Eosinophils Absolute: 0.2 10*3/uL (ref 0.0–0.5)
Eosinophils Relative: 3 %
HCT: 37.3 % (ref 36.0–46.0)
Hemoglobin: 12.5 g/dL (ref 12.0–15.0)
Immature Granulocytes: 0 %
Lymphocytes Relative: 30 %
Lymphs Abs: 2 10*3/uL (ref 0.7–4.0)
MCH: 30.2 pg (ref 26.0–34.0)
MCHC: 33.5 g/dL (ref 30.0–36.0)
MCV: 90.1 fL (ref 80.0–100.0)
Monocytes Absolute: 0.5 10*3/uL (ref 0.1–1.0)
Monocytes Relative: 7 %
Neutro Abs: 4 10*3/uL (ref 1.7–7.7)
Neutrophils Relative %: 60 %
Platelet Count: 299 10*3/uL (ref 150–400)
RBC: 4.14 MIL/uL (ref 3.87–5.11)
RDW: 13.2 % (ref 11.5–15.5)
WBC Count: 6.8 10*3/uL (ref 4.0–10.5)
nRBC: 0 % (ref 0.0–0.2)

## 2020-01-07 LAB — CMP (CANCER CENTER ONLY)
ALT: 42 U/L (ref 0–44)
AST: 40 U/L (ref 15–41)
Albumin: 3.4 g/dL — ABNORMAL LOW (ref 3.5–5.0)
Alkaline Phosphatase: 142 U/L — ABNORMAL HIGH (ref 38–126)
Anion gap: 9 (ref 5–15)
BUN: 14 mg/dL (ref 8–23)
CO2: 27 mmol/L (ref 22–32)
Calcium: 9.4 mg/dL (ref 8.9–10.3)
Chloride: 104 mmol/L (ref 98–111)
Creatinine: 0.96 mg/dL (ref 0.44–1.00)
GFR, Est AFR Am: >60 mL/min (ref >60)
GFR, Estimated: >60 mL/min (ref >60)
Glucose, Bld: 125 mg/dL — ABNORMAL HIGH (ref 70–99)
Potassium: 4.2 mmol/L (ref 3.5–5.1)
Sodium: 140 mmol/L (ref 135–145)
Total Bilirubin: 0.7 mg/dL (ref 0.3–1.2)
Total Protein: 6.9 g/dL (ref 6.5–8.1)

## 2020-01-07 LAB — RESEARCH LABS

## 2020-01-07 MED ORDER — PROCHLORPERAZINE MALEATE 10 MG PO TABS
ORAL_TABLET | ORAL | Status: AC
  Filled 2020-01-07: qty 1

## 2020-01-07 MED ORDER — SODIUM CHLORIDE 0.9 % IV SOLN
Freq: Once | INTRAVENOUS | Status: AC
  Filled 2020-01-07: qty 250

## 2020-01-07 MED ORDER — SODIUM CHLORIDE 0.9 % IV SOLN
1000.0000 mg/m2 | Freq: Once | INTRAVENOUS | Status: AC
  Administered 2020-01-07: 1596 mg via INTRAVENOUS
  Filled 2020-01-07: qty 41.98

## 2020-01-07 MED ORDER — SODIUM CHLORIDE 0.9% FLUSH
10.0000 mL | INTRAVENOUS | Status: DC | PRN
  Administered 2020-01-07: 12:00:00 10 mL
  Filled 2020-01-07: qty 10

## 2020-01-07 MED ORDER — SODIUM CHLORIDE 0.9 % IV SOLN
Freq: Once | INTRAVENOUS | Status: DC
  Filled 2020-01-07: qty 250

## 2020-01-07 MED ORDER — HEPARIN SOD (PORK) LOCK FLUSH 100 UNIT/ML IV SOLN
500.0000 [IU] | Freq: Once | INTRAVENOUS | Status: AC | PRN
  Administered 2020-01-07: 500 [IU]
  Filled 2020-01-07: qty 5

## 2020-01-07 MED ORDER — PROCHLORPERAZINE MALEATE 10 MG PO TABS
10.0000 mg | ORAL_TABLET | Freq: Once | ORAL | Status: AC
  Administered 2020-01-07: 10 mg via ORAL

## 2020-01-07 MED ORDER — PACLITAXEL PROTEIN-BOUND CHEMO INJECTION 100 MG
100.0000 mg/m2 | Freq: Once | INTRAVENOUS | Status: AC
  Administered 2020-01-07: 150 mg via INTRAVENOUS
  Filled 2020-01-07: qty 30

## 2020-01-07 MED ORDER — SODIUM CHLORIDE 0.9% FLUSH
10.0000 mL | INTRAVENOUS | Status: DC | PRN
  Administered 2020-01-07: 10 mL
  Filled 2020-01-07: qty 10

## 2020-01-07 NOTE — Research (Signed)
[employer] SCI[employer] 2018-01 STUDY; BLOOD SAMPLE COLLECTION TO EVALUATE BIOMARKERS IN SUBJECTS WITH UNTREATED SOLID TUMORS.   CONSENT/HISTORY: Patient signed consent and completed historical interview during prior visit (see 12/29/19 research note). Patient enrolled and assigned study ID# [id number].   SPECIMEN COLLECTION: Research blood specimens were collected today (within 14 day time limit) via implanted port per study protocol.  Blood sample kit # [id number] was used for this blood collection. Patient tolerated well without any complaints or adverse effects noted.   GIFT CARD: A $50 [employer] gift card was provided to the patient after blood collection by clinical research assistant, Farris Has: patient signed for receipt of gift card. I had an opportunity to briefly visit the patient and thanked her for her time and participation in this study.  Dionne Bucy. Sharlett Iles, BSN, RN, CIC 01/07/2020 4:22 PM

## 2020-01-07 NOTE — Progress Notes (Signed)
Met with patient in infusion to introduce myself as Financial Resource Specialist and to offer available resources.  Discussed one-time $1000 [employer] grant and qualifications to assist with personal expenses while going through treatment.  Gave her my card if interested in applying and for any additional financial questions or concerns. 

## 2020-01-07 NOTE — Patient Instructions (Signed)
[facility] Discharge Instructions for Patients Receiving Chemotherapy  Today you received the following chemotherapy agents: Gemzar & Abraxane  To help prevent nausea and vomiting after your treatment, we encourage you to take your nausea medication as prescribed.   If you develop nausea and vomiting that is not controlled by your nausea medication, call the clinic.   BELOW ARE SYMPTOMS THAT SHOULD BE REPORTED IMMEDIATELY:  *FEVER GREATER THAN 100.5 F  *CHILLS WITH OR WITHOUT FEVER  NAUSEA AND VOMITING THAT IS NOT CONTROLLED WITH YOUR NAUSEA MEDICATION  *UNUSUAL SHORTNESS OF BREATH  *UNUSUAL BRUISING OR BLEEDING  TENDERNESS IN MOUTH AND THROAT WITH OR WITHOUT PRESENCE OF ULCERS  *URINARY PROBLEMS  *BOWEL PROBLEMS  UNUSUAL RASH Items with * indicate a potential emergency and should be followed up as soon as possible.  Feel free to call the clinic should you have any questions or concerns. The clinic phone number is [phone].  Please show the [facility] at check-in to the Emergency Department and triage nurse.    Gemcitabine injection What is this medicine? GEMCITABINE (jem SYE ta been) is a chemotherapy drug. This medicine is used to treat many types of cancer like breast cancer, lung cancer, pancreatic cancer, and ovarian cancer. This medicine may be used for other purposes; ask your health care provider or pharmacist if you have questions. COMMON BRAND NAME(S): Gemzar, Infugem What should I tell my health care provider before I take this medicine? They need to know if you have any of these conditions:  blood disorders  infection  kidney disease  liver disease  lung or breathing disease, like asthma  recent or ongoing radiation therapy  an unusual or allergic reaction to gemcitabine, other chemotherapy, other medicines, foods, dyes, or preservatives  pregnant or trying to get pregnant  breast-feeding How should I use this  medicine? This drug is given as an infusion into a vein. It is administered in a hospital or clinic by a specially trained health care professional. Talk to your pediatrician regarding the use of this medicine in children. Special care may be needed. Overdosage: If you think you have taken too much of this medicine contact a poison control center or emergency room at once. NOTE: This medicine is only for you. Do not share this medicine with others. What if I miss a dose? It is important not to miss your dose. Call your doctor or health care professional if you are unable to keep an appointment. What may interact with this medicine?  medicines to increase blood counts like filgrastim, pegfilgrastim, sargramostim  some other chemotherapy drugs like cisplatin  vaccines Talk to your doctor or health care professional before taking any of these medicines:  acetaminophen  aspirin  ibuprofen  ketoprofen  naproxen This list may not describe all possible interactions. Give your health care provider a list of all the medicines, herbs, non-prescription drugs, or dietary supplements you use. Also tell them if you smoke, drink alcohol, or use illegal drugs. Some items may interact with your medicine. What should I watch for while using this medicine? Visit your doctor for checks on your progress. This drug may make you feel generally unwell. This is not uncommon, as chemotherapy can affect healthy cells as well as cancer cells. Report any side effects. Continue your course of treatment even though you feel ill unless your doctor tells you to stop. In some cases, you may be given additional medicines to help with side effects. Follow all directions  for their use. Call your doctor or health care professional for advice if you get a fever, chills or sore throat, or other symptoms of a cold or flu. Do not treat yourself. This drug decreases your body's ability to fight infections. Try to avoid being  around people who are sick. This medicine may increase your risk to bruise or bleed. Call your doctor or health care professional if you notice any unusual bleeding. Be careful brushing and flossing your teeth or using a toothpick because you may get an infection or bleed more easily. If you have any dental work done, tell your dentist you are receiving this medicine. Avoid taking products that contain aspirin, acetaminophen, ibuprofen, naproxen, or ketoprofen unless instructed by your doctor. These medicines may hide a fever. Do not become pregnant while taking this medicine or for 6 months after stopping it. Women should inform their doctor if they wish to become pregnant or think they might be pregnant. Men should not father a child while taking this medicine and for 3 months after stopping it. There is a potential for serious side effects to an unborn child. Talk to your health care professional or pharmacist for more information. Do not breast-feed an infant while taking this medicine or for at least 1 week after stopping it. Men should inform their doctors if they wish to father a child. This medicine may lower sperm counts. Talk with your doctor or health care professional if you are concerned about your fertility. What side effects may I notice from receiving this medicine? Side effects that you should report to your doctor or health care professional as soon as possible:  allergic reactions like skin rash, itching or hives, swelling of the face, lips, or tongue  breathing problems  pain, redness, or irritation at site where injected  signs and symptoms of a dangerous change in heartbeat or heart rhythm like chest pain; dizziness; fast or irregular heartbeat; palpitations; feeling faint or lightheaded, falls; breathing problems  signs of decreased platelets or bleeding - bruising, pinpoint red spots on the skin, black, tarry stools, blood in the urine  signs of decreased red blood cells -  unusually weak or tired, feeling faint or lightheaded, falls  signs of infection - fever or chills, cough, sore throat, pain or difficulty passing urine  signs and symptoms of kidney injury like trouble passing urine or change in the amount of urine  signs and symptoms of liver injury like dark yellow or brown urine; general ill feeling or flu-like symptoms; light-colored stools; loss of appetite; nausea; right upper belly pain; unusually weak or tired; yellowing of the eyes or skin  swelling of ankles, feet, hands Side effects that usually do not require medical attention (report to your doctor or health care professional if they continue or are bothersome):  constipation  diarrhea  hair loss  loss of appetite  nausea  rash  vomiting This list may not describe all possible side effects. Call your doctor for medical advice about side effects. You may report side effects to FDA at 1-800-FDA-1088. Where should I keep my medicine? This drug is given in a hospital or clinic and will not be stored at home. NOTE: This sheet is a summary. It may not cover all possible information. If you have questions about this medicine, talk to your doctor, pharmacist, or health care provider.  2020 [employer]/Gold Standard (2017-11-27 18:06:11)   Nanoparticle Albumin-Bound Paclitaxel injection What is this medicine? NANOPARTICLE ALBUMIN-BOUND PACLITAXEL (Na no PAHR ti  kuhl al BYOO muhn-bound PAK li TAX el) is a chemotherapy drug. It targets fast dividing cells, like cancer cells, and causes these cells to die. This medicine is used to treat advanced breast cancer, lung cancer, and pancreatic cancer. This medicine may be used for other purposes; ask your health care provider or pharmacist if you have questions. COMMON BRAND NAME(S): Abraxane What should I tell my health care provider before I take this medicine? They need to know if you have any of these conditions:  kidney disease  liver  disease  low blood counts, like low white cell, platelet, or red cell counts  lung or breathing disease, like asthma  tingling of the fingers or toes, or other nerve disorder  an unusual or allergic reaction to paclitaxel, albumin, other chemotherapy, other medicines, foods, dyes, or preservatives  pregnant or trying to get pregnant  breast-feeding How should I use this medicine? This drug is given as an infusion into a vein. It is administered in a hospital or clinic by a specially trained health care professional. Talk to your pediatrician regarding the use of this medicine in children. Special care may be needed. Overdosage: If you think you have taken too much of this medicine contact a poison control center or emergency room at once. NOTE: This medicine is only for you. Do not share this medicine with others. What if I miss a dose? It is important not to miss your dose. Call your doctor or health care professional if you are unable to keep an appointment. What may interact with this medicine? This medicine may interact with the following medications:  antiviral medicines for hepatitis, HIV or AIDS  certain antibiotics like erythromycin and clarithromycin  certain medicines for fungal infections like ketoconazole and itraconazole  certain medicines for seizures like carbamazepine, phenobarbital, phenytoin  gemfibrozil  nefazodone  rifampin  St. John's wort This list may not describe all possible interactions. Give your health care provider a list of all the medicines, herbs, non-prescription drugs, or dietary supplements you use. Also tell them if you smoke, drink alcohol, or use illegal drugs. Some items may interact with your medicine. What should I watch for while using this medicine? Your condition will be monitored carefully while you are receiving this medicine. You will need important blood work done while you are taking this medicine. This medicine can cause  serious allergic reactions. If you experience allergic reactions like skin rash, itching or hives, swelling of the face, lips, or tongue, tell your doctor or health care professional right away. In some cases, you may be given additional medicines to help with side effects. Follow all directions for their use. This drug may make you feel generally unwell. This is not uncommon, as chemotherapy can affect healthy cells as well as cancer cells. Report any side effects. Continue your course of treatment even though you feel ill unless your doctor tells you to stop. Call your doctor or health care professional for advice if you get a fever, chills or sore throat, or other symptoms of a cold or flu. Do not treat yourself. This drug decreases your body's ability to fight infections. Try to avoid being around people who are sick. This medicine may increase your risk to bruise or bleed. Call your doctor or health care professional if you notice any unusual bleeding. Be careful brushing and flossing your teeth or using a toothpick because you may get an infection or bleed more easily. If you have any dental work done,  tell your dentist you are receiving this medicine. Avoid taking products that contain aspirin, acetaminophen, ibuprofen, naproxen, or ketoprofen unless instructed by your doctor. These medicines may hide a fever. Do not become pregnant while taking this medicine or for 6 months after stopping it. Women should inform their doctor if they wish to become pregnant or think they might be pregnant. Men should not father a child while taking this medicine or for 3 months after stopping it. There is a potential for serious side effects to an unborn child. Talk to your health care professional or pharmacist for more information. Do not breast-feed an infant while taking this medicine or for 2 weeks after stopping it. This medicine may interfere with the ability to get pregnant or to father a child. You should  talk to your doctor or health care professional if you are concerned about your fertility. What side effects may I notice from receiving this medicine? Side effects that you should report to your doctor or health care professional as soon as possible:  allergic reactions like skin rash, itching or hives, swelling of the face, lips, or tongue  breathing problems  changes in vision  fast, irregular heartbeat  low blood pressure  mouth sores  pain, tingling, numbness in the hands or feet  signs of decreased platelets or bleeding - bruising, pinpoint red spots on the skin, black, tarry stools, blood in the urine  signs of decreased red blood cells - unusually weak or tired, feeling faint or lightheaded, falls  signs of infection - fever or chills, cough, sore throat, pain or difficulty passing urine  signs and symptoms of liver injury like dark yellow or brown urine; general ill feeling or flu-like symptoms; light-colored stools; loss of appetite; nausea; right upper belly pain; unusually weak or tired; yellowing of the eyes or skin  swelling of the ankles, feet, hands  unusually slow heartbeat Side effects that usually do not require medical attention (report to your doctor or health care professional if they continue or are bothersome):  diarrhea  hair loss  loss of appetite  nausea, vomiting  tiredness This list may not describe all possible side effects. Call your doctor for medical advice about side effects. You may report side effects to FDA at 1-800-FDA-1088. Where should I keep my medicine? This drug is given in a hospital or clinic and will not be stored at home. NOTE: This sheet is a summary. It may not cover all possible information. If you have questions about this medicine, talk to your doctor, pharmacist, or health care provider.  2020 [employer]/Gold Standard (2017-05-07 13:03:45)

## 2020-01-07 NOTE — Progress Notes (Addendum)
[place]   [phone] [phone]   Clinic Follow up Note   Patient Care Team: Ladell Pier, MD as PCP - General (Oncology) Jonnie Finner, RN as Oncology Nurse Navigator 01/07/2020  CHIEF COMPLAINT: F/u pancreas cancer   CURRENT THERAPY: PENDING first line systemic chemotherapy with gemcitabine and abraxane days 1 and 15 q28 days   INTERVAL HISTORY: Mary [name] returns for f/u and treatment as scheduled. She is feeling better. Energy and appetite are improving. She can do light housework. She is eating more solids, continues drinking liquids adequately. Mirtazapine is helping. Had a good BM today with colace BID and miralax 1-2 times daily. Denies n/v in 2 weeks. Abdominal pain has improved, controlled with tylenol now. She continues bentyl for periodic abdominal spasms. Denies fever, chills, cough, chest pain, dyspnea, leg edema, or signs of juandice. She had PAC placed, port is tender, and attended virtual chemo class.     MEDICAL HISTORY:  Past Medical History:  Diagnosis Date  . Abdominal distention 12/07/2019  . 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 Asthma    "adult" asthma  . Atrial septal defect, sinus venosus 12/07/2019  . Common bile duct dilation 12/07/2019  . Complication of anesthesia    had trouble waking up on her first colonoscopy  . Dilated pancreatic duct 12/07/2019  . Dysrhythmia    palpitatations  . Elevated lipase 12/07/2019  . Essential hypertension 12/07/2019  . Family history of breast cancer   . History of kidney stones    on xray only  . Hypothyroidism 12/07/2019   "being watched" not on medications at this time  . Loss of weight 12/07/2019  . Nausea 12/07/2019  . Pancreatic mass 12/07/2019  . Pneumonia   . Pure hypercholesterolemia 12/07/2019    SURGICAL HISTORY: Past Surgical History:  Procedure Laterality Date  . ASD REPAIR, SINUS VENOSUS     at age 50   . BILIARY STENT PLACEMENT  12/11/2019   Procedure: BILIARY STENT  PLACEMENT;  Surgeon: [name], MD;  Location: [facility] ENDOSCOPY;  Service: Endoscopy;;  . COLONOSCOPY    . ENDOSCOPIC RETROGRADE CHOLANGIOPANCREATOGRAPHY (ERCP) WITH PROPOFOL N/A 12/11/2019   Procedure: ENDOSCOPIC RETROGRADE CHOLANGIOPANCREATOGRAPHY (ERCP) WITH PROPOFOL;  Surgeon: [name], MD;  Location: [facility] ENDOSCOPY;  Service: Endoscopy;  Laterality: N/A;  . ESOPHAGOGASTRODUODENOSCOPY (EGD) WITH PROPOFOL N/A 12/11/2019   Procedure: ESOPHAGOGASTRODUODENOSCOPY (EGD) WITH PROPOFOL;  Surgeon: [name], MD;  Location: [facility] ENDOSCOPY;  Service: Endoscopy;  Laterality: N/A;  . FINE NEEDLE ASPIRATION  12/11/2019   Procedure: FINE NEEDLE ASPIRATION (FNA) LINEAR;  Surgeon: [name], MD;  Location: [place];  Service: Endoscopy;;  . IR IMAGING GUIDED PORT INSERTION  01/04/2020  . TONSILLECTOMY    . TUBAL LIGATION    . UPPER ESOPHAGEAL ENDOSCOPIC ULTRASOUND (EUS) N/A 12/11/2019   Procedure: UPPER ESOPHAGEAL ENDOSCOPIC ULTRASOUND (EUS);  Surgeon: [name], MD;  Location: [facility] ENDOSCOPY;  Service: Endoscopy;  Laterality: N/A;    I have reviewed the social history and family history with the patient and they are unchanged from previous note.  ALLERGIES:  is allergic to ultram [tramadol]; penicillins; and sulfa antibiotics.  MEDICATIONS:  Current Outpatient Medications  Medication Sig Dispense Refill  . acetaminophen (TYLENOL) 500 MG tablet Take 500 mg by mouth every 6 (six) hours as needed for moderate pain or headache.     . albuterol (VENTOLIN HFA) 108 (90 Base) MCG/ACT inhaler Inhale 2 puffs into the lungs every 4 (four) hours as  needed for wheezing or shortness of breath.     Marland Kitchen aspirin EC 81 MG tablet Take 81 mg by mouth at bedtime.     Marland Kitchen atorvastatin (LIPITOR) 40 MG tablet Take 40 mg by mouth at bedtime.     . calcium carbonate (TUMS EX) 750 MG chewable tablet Chew 750 mg by mouth daily as needed for heartburn.    . dicyclomine (BENTYL) 10 MG capsule Take 1 capsule  (10 mg total) by mouth 2 (two) times daily as needed for spasms. 30 capsule 0  . docusate sodium (COLACE) 100 MG capsule Take 100 mg by mouth daily.    Marland Kitchen HYDROcodone-acetaminophen (NORCO/VICODIN) 5-325 MG tablet Take 1 tablet by mouth every 6 (six) hours as needed for moderate pain. 30 tablet 0  . lidocaine-prilocaine (EMLA) cream Apply 1 application topically as needed. 30 g 0  . losartan (COZAAR) 50 MG tablet Take 50 mg by mouth at bedtime.     . magnesium hydroxide (MILK OF MAGNESIA) 400 MG/5ML suspension Take 15 mLs by mouth daily as needed for mild constipation.    . mirtazapine (REMERON) 7.5 MG tablet Take 1 tablet (7.5 mg total) by mouth at bedtime. 30 tablet 0  . ondansetron (ZOFRAN-ODT) 8 MG disintegrating tablet Take 1 tablet (8 mg total) by mouth every 8 (eight) hours as needed for nausea or vomiting. 30 tablet 1  . OVER THE COUNTER MEDICATION Apply 1 application topically at bedtime as needed (foot burning). amish foot cream    . oxymetazoline (VICKS SINEX) 0.05 % nasal spray Place 1 spray into both nostrils 2 (two) times daily as needed for congestion.    . polyethylene glycol (MIRALAX / GLYCOLAX) 17 g packet Take 17 g by mouth daily.    . prochlorperazine (COMPAZINE) 10 MG tablet Take 1 tablet (10 mg total) by mouth every 6 (six) hours as needed for nausea or vomiting. 30 tablet 0   No current facility-administered medications for this visit.   Facility-Administered Medications Ordered in Other Visits  Medication Dose Route Frequency Provider Last Rate Last Admin  . 0.9 %  sodium chloride infusion   Intravenous Once Ladell Pier, MD      . gemcitabine (GEMZAR) 1,596 mg in sodium chloride 0.9 % 250 mL chemo infusion  1,000 mg/m2 (Treatment Plan Recorded) Intravenous Once Ladell Pier, MD      . heparin lock flush 100 unit/mL  500 Units Intracatheter Once PRN Ladell Pier, MD      . PACLitaxel-protein bound (ABRAXANE) chemo infusion 150 mg  100 mg/m2 (Treatment Plan  Recorded) Intravenous Once Ladell Pier, MD 60 mL/hr at 01/07/20 1407 150 mg at 01/07/20 1407  . sodium chloride flush (NS) 0.9 % injection 10 mL  10 mL Intracatheter PRN Ladell Pier, MD   10 mL at 01/07/20 1139  . sodium chloride flush (NS) 0.9 % injection 10 mL  10 mL Intracatheter PRN Ladell Pier, MD        PHYSICAL EXAMINATION: ECOG PERFORMANCE STATUS: 1-2  Vitals:   01/07/20 1204  BP: 138/75  Pulse: (!) 102  Resp: 17  Temp: 98.7 F (37.1 C)  SpO2: 99%   Filed Weights   01/07/20 1204  Weight: 126 lb 4.8 oz (57.3 kg)    GENERAL:alert, no distress and comfortable SKIN: no rash  EYES:  sclera clear LUNGS: clear with normal breathing effort HEART: mild tachycardia. Regular rhythm. no lower extremity edema ABDOMEN:abdomen soft, non-tender and normal bowel sounds NEURO: alert &  oriented x 3 with fluent speech, normal gait PAC healing well, covered with gauze dressing   LABORATORY DATA:  I have reviewed the data as listed CBC Latest Ref Rng & Units 01/07/2020 12/29/2019  WBC 4.0 - 10.5 K/uL 6.8 7.9  Hemoglobin 12.0 - 15.0 g/dL 12.5 12.7  Hematocrit 36.0 - 46.0 % 37.3 39.2  Platelets 150 - 400 K/uL 299 362     CMP Latest Ref Rng & Units 01/07/2020 12/29/2019 12/24/2019  Glucose 70 - 99 mg/dL 125(H) 123(H) 130(H)  BUN 8 - 23 mg/dL 14 17 27(H)  Creatinine 0.44 - 1.00 mg/dL 0.96 1.00 0.95  Sodium 135 - 145 mmol/L 140 139 139  Potassium 3.5 - 5.1 mmol/L 4.2 4.3 4.4  Chloride 98 - 111 mmol/L 104 97(L) 101  CO2 22 - 32 mmol/L 27 30 27   Calcium 8.9 - 10.3 mg/dL 9.4 9.7 10.0  Total Protein 6.5 - 8.1 g/dL 6.9 7.6 -  Total Bilirubin 0.3 - 1.2 mg/dL 0.7 0.9 -  Alkaline Phos 38 - 126 U/L 142(H) 173(H) -  AST 15 - 41 U/L 40 54(H) -  ALT 0 - 44 U/L 42 72(H) -      RADIOGRAPHIC STUDIES: I have personally reviewed the radiological images as listed and agreed with the findings in the report. No results found.   ASSESSMENT & PLAN:   1. 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              -12/02/19 MRI of the abdomen - showed a 18 x17 mm mass in the head of the pancreas with pancreatic duct and CBD dilatation. There are small cysts noted in the right liver lobe, otherwise no evidence of hepatic mets or other metastatic disease in the abdomen.             -CA 19-9 1349              -12/11/19 EUS/FNA per Dr. Ardis Hughs - irregular hypoechoic 3.4 cm mass in the pancreatic head with a 1.5 cm segment of invasion into the portal vein. There was no peripancreatic adenopathy.              -Pathology showed malignant cells consistent with adenocarcinoma             -12/11/19 ERCP with placement of metallic biliary stent of malignant stricture and obstruction of the distal CBD.              -CT chest 12/24/2019, multiple nonspecific lung nodules, indeterminate low-attenuation lesion in segment 7 of the liver, enlarged porta hepatis node, hypodense nodule at the isthmus of the thyroid gland             -CT abdomen/pelvis 12/29/2019, 3.1 cm pancreas body mass extending along 200 degrees of the anterior distal portal vein with narrowing of the portal vein, mass involves the right gastric artery, mildly enlarged porta hepatis node, segment 8 liver lesion-nonspecific  -CA 19-9 on 12/29/19 2298  -Cycle 1 gemcitabine/abraxane on 01/07/20               2. Abdominal discomfort, nausea, constipation, secondary to #1              -  s/p ERCP and stenting of malignant stricture of distal CBD              -We reviewed bowel regimen and pain control. Given tramadol PRN on 12/15/19   3. Transaminitis and hyperbilirubinemia, secondary to #1             12/04/19 Bili 3.3, ALT 538 AST 527 and ALK PHOS 414.             -12/11/19 Tbili was elevated to 6.4              -juandice resolved  after ERCP and stenting   4. Anorexia and weight loss             -presented with 15 lbs weight loss, secondary to #1             -she has been referred to dietician   5. Genetics              -Her family history is not strongly suggestive of an inheritable cancer syndrome.              -Due to her personal history of pancreas cancer, she qualifies for genetics, r/o BRCA mutation.              -If positive, she would be a candidate for PARP inhibitor in the future   -collected 12/29/19, pending   6. Vaccinations, s/p both doses of COVID19 vaccine (moderna)  Disposition:  Mr. Amoroso appears improved. Her energy and appetite are better. Her abdominal pain is decreased, controlled with tylenol only. She is s/p port placement and has attended chemo education session. Her CBC is normal. Transaminitis continues to improve. Labs adequate to proceed with cycle 1 day 1 gemcitabine and abraxane today. We again reviewed potential side effects, symptom management, and when to call [facility] with concerning symptoms.   Her case was presented in GI tumor board this week. Given the proximity of the cancer to major vasculature, she is considered borderline resectable. She will see Dr. Barry Dienes on 01/11/20 for surgical opinion. Ms. Glas was seen with Dr. Benay Spice today.   She will return for f/u and cycle 1 day 15 gem/abraxane in 2 weeks.     Orders Placed This Encounter  Procedures  . CBC with Differential (Cancer Center Only)    Standing Status:   Future    Number of Occurrences:   1    Standing Expiration Date:   01/05/2021  . CMP ([place] only)    Standing Status:   Future    Number of Occurrences:   1    Standing Expiration Date:   01/05/2021  . CBC with Differential (Cancer Center Only)    Standing Status:   Future    Standing Expiration Date:   01/06/2021  . CMP ([place] only)    Standing Status:   Future    Standing Expiration Date:   01/06/2021  . CA 19.9    Standing Status:    Future    Standing Expiration Date:   01/06/2021   All questions were answered. The patient knows to call the clinic with any problems, questions or concerns. No barriers to learning were detected.     Alla Feeling, NP 01/07/20  This was a shared visit with Cira Rue.  Ms. Lablanc appears to have an improved performance status compared to when we saw her last week.  Her case was presented at the GI tumor conference this week.  She is felt to have borderline resectable disease.  She does not appear to be a candidate for FOLFIRINOX.  The plan is to begin gemcitabine/Abraxane today.  Julieanne Manson, MD

## 2020-01-11 ENCOUNTER — Telehealth: Payer: Self-pay | Admitting: Nurse Practitioner

## 2020-01-11 NOTE — Telephone Encounter (Signed)
Scheduled appt per 4/22 los.  Spoke with pt and she is aware of the appt date and time

## 2020-01-12 ENCOUNTER — Other Ambulatory Visit: Payer: Self-pay

## 2020-01-13 ENCOUNTER — Encounter: Payer: Self-pay | Admitting: Genetic Counselor

## 2020-01-13 ENCOUNTER — Telehealth: Payer: Self-pay | Admitting: Genetic Counselor

## 2020-01-13 ENCOUNTER — Ambulatory Visit: Payer: Self-pay | Admitting: Genetic Counselor

## 2020-01-13 DIAGNOSIS — Z1379 Encounter for other screening for genetic and chromosomal anomalies: Secondary | ICD-10-CM

## 2020-01-13 NOTE — Progress Notes (Signed)
HPI:  Mary [name] was previously seen in the [place] clinic due to a personal history of pancreatic cancer and concerns regarding a hereditary predisposition to cancer. Please refer to our prior cancer genetics clinic note for more information regarding our discussion, assessment and recommendations, at the time. Mary [name] recent genetic test results were disclosed to her, as were recommendations warranted by these results. These results and recommendations are discussed in more detail below.  CANCER HISTORY:  Oncology History  Malignant neoplasm of head of pancreas ([place])  12/29/2019 Initial Diagnosis   Malignant neoplasm of head of pancreas ([place])   01/07/2020 -  Chemotherapy   The patient had PACLitaxel-protein bound (ABRAXANE) chemo infusion 150 mg, 100 mg/m2 = 150 mg (100 % of original dose 100 mg/m2), Intravenous,  Once, 1 of 4 cycles Dose modification: 100 mg/m2 (original dose 100 mg/m2, Cycle 1, Reason: Provider Judgment) Administration: 150 mg (01/07/2020) gemcitabine (GEMZAR) 1,596 mg in sodium chloride 0.9 % 250 mL chemo infusion, 1,000 mg/m2 = 1,596 mg, Intravenous,  Once, 1 of 4 cycles Administration: 1,596 mg (01/07/2020)  for chemotherapy treatment.    01/12/2020 Genetic Testing   Negative genetic testing:  No pathogenic variants detected on the [facility] Common Hereditary Cancers panel. The report date is 01/12/2020.  The Common Hereditary Cancers Panel offered by [facility] includes sequencing and/or deletion duplication testing of the following 48 genes: APC, ATM, AXIN2, BARD1, BMPR1A, BRCA1, BRCA2, BRIP1, CDH1, CDK4, CDKN2A (p14ARF), CDKN2A (p16INK4a), CHEK2, CTNNA1, DICER1, EPCAM (Deletion/duplication testing only), GREM1 (promoter region deletion/duplication testing only), KIT, MEN1, MLH1, MSH2, MSH3, MSH6, MUTYH, NBN, NF1, NHTL1, PALB2, PDGFRA, PMS2, POLD1, POLE, PTEN, RAD50, RAD51C, RAD51D, RNF43, SDHB, SDHC, SDHD, SMAD4, SMARCA4. STK11, TP53, TSC1, TSC2, and VHL.   The following genes were evaluated for sequence changes only: SDHA and HOXB13 c.251G>A variant only.      FAMILY HISTORY:  We obtained a detailed, 4-generation family history.  Significant diagnoses are listed below: Family History  Problem Relation Age of Onset  . COPD Mother   . Dementia Father   . Diabetes Father   . Hypertension Father   . Alzheimer's disease Brother   . Breast cancer Paternal Aunt        dx. in her early 28s  . Diabetes Paternal Aunt   . Diabetes Paternal Uncle   . Other Maternal Grandfather        hospitalized for rectal bleeding before death  . Dementia Paternal Grandfather   . Dementia Paternal Uncle    Mary [name] has one daughter (age 71) and one son (age 4). She has three brothers and one sister, ranging in age from 14 to 58. None of these family members have had cancer.  Mary [name] mother died at the age of 87 and did not have cancer. Mary [name] had two maternal aunts and two maternal uncles, all who died when they were older than 89. Her maternal grandparents died when they were in their late 25s. She notes that her maternal grandfather was hospitalized for rectal bleeding prior to his death, although she is not sure if this was due to cancer. There are no known diagnoses of cancer on the maternal side of the family.  Mary [name] father died at the age of 33 and did not have cancer, although he had congestive heart failure and dementia. She had three paternal aunts and two paternal uncles. One aunt had breast cancer diagnosed in her early 21s. Her other aunts and uncles died in  their 90s and did not have cancer. Her paternal grandmother died in her late 90s and had multiple medical problems but not cancer. Her maternal grandfather died at the age of 66 and had dementia. There are no other known diagnoses of cancer on the paternal side of the family.  [name] is unaware of previous family history of genetic testing for hereditary cancer risks.  Her ancestry is unknown. There is no reported Ashkenazi Jewish ancestry. There is no known consanguinity.  GENETIC TEST RESULTS: Genetic testing reported out on 01/12/2020 through the [facility] Common Hereditary Cancers panel. No pathogenic variants were detected.   The Common Hereditary Cancers Panel offered by [facility] includes sequencing and/or deletion duplication testing of the following 48 genes: APC, ATM, AXIN2, BARD1, BMPR1A, BRCA1, BRCA2, BRIP1, CDH1, CDK4, CDKN2A (p14ARF), CDKN2A (p16INK4a), CHEK2, CTNNA1, DICER1, EPCAM (Deletion/duplication testing only), GREM1 (promoter region deletion/duplication testing only), KIT, MEN1, MLH1, MSH2, MSH3, MSH6, MUTYH, NBN, NF1, NHTL1, PALB2, PDGFRA, PMS2, POLD1, POLE, PTEN, RAD50, RAD51C, RAD51D, RNF43, SDHB, SDHC, SDHD, SMAD4, SMARCA4. STK11, TP53, TSC1, TSC2, and VHL.  The following genes were evaluated for sequence changes only: SDHA and HOXB13 c.251G>A variant only. The test report will be scanned into EPIC and located under the Molecular Pathology section of the Results Review tab.  A portion of the result report is included below for reference.     We discussed with Mary [name] that because current genetic testing is not perfect, it is possible there may be a gene mutation in one of these genes that current testing cannot detect, but that chance is small.  We also discussed, that there could be another gene that has not yet been discovered, or that we have not yet tested, that is responsible for the cancer diagnoses in the family.  Therefore, it is important to remain in touch with cancer genetics in the future so that we can continue to offer Mary [name] the most up to date genetic testing.   CANCER SCREENING RECOMMENDATIONS: Mary [name] test result is considered negative (normal).  This means that we have not identified a hereditary cause for her personal and family history of cancer at this time. Most cancers happen by chance and this negative test  suggests that her personal and family of cancer may fall into this category.    While reassuring, this does not definitively rule out a hereditary predisposition to cancer. It is still possible that there could be genetic mutations that are undetectable by current technology. There could be genetic mutations in genes that have not been tested or identified to increase cancer risk.  Therefore, it is recommended she continue to follow the cancer management and screening guidelines provided by her oncology and primary healthcare providers.   An individual's cancer risk and medical management are not determined by genetic test results alone. Overall cancer risk assessment incorporates additional factors, including personal medical history, family history, and any available genetic information that may result in a personalized plan for cancer prevention and surveillance.  RECOMMENDATIONS FOR FAMILY MEMBERS:  Individuals in this family might be at some increased risk of developing cancer, over the general population risk, simply due to the family history of cancer.  We recommended women in this family have a yearly mammogram beginning at age 74, or 56 years younger than the earliest onset of cancer, an annual clinical breast exam, and perform monthly breast self-exams. Women in this family should also have a gynecological exam as recommended by their primary provider. All family  members should have a colonoscopy by age 56.  FOLLOW-UP: Lastly, we discussed with Mary [name] that cancer genetics is a rapidly advancing field and it is possible that new genetic tests will be appropriate for her and/or her family members in the future. We encouraged her to remain in contact with cancer genetics on an annual basis so we can update her personal and family histories and let her know of advances in cancer genetics that may benefit this family.   Our contact number was provided. Mary [name] questions were answered to her  satisfaction, and she knows she is welcome to call us at anytime with additional questions or concerns.   Clint Guy, MS, [facility] Genetic Counselor [email or web address] Phone: [phone]

## 2020-01-13 NOTE — Telephone Encounter (Signed)
Revealed negative genetic testing. Discussed that we do not know why she has pancreatic cancer. It is possible that there could be a mutation in a different gene that we are not testing, or our current technology may not be able detect certain mutations.  It will therefore be a good idea for her to keep in contact with genetics to keep up with whether additional testing may be appropriate in the future.

## 2020-01-13 NOTE — Telephone Encounter (Signed)
Called Mary [name] to discuss her genetic test results. She would like to have her husband there to hear the results as well, but he is currently at the store. She requested that I call back around 12:00 to go over these results.

## 2020-01-15 ENCOUNTER — Other Ambulatory Visit: Payer: Self-pay | Admitting: Nurse Practitioner

## 2020-01-15 NOTE — Progress Notes (Signed)
Pharmacist Chemotherapy Monitoring - Follow Up Assessment    I verify that I have reviewed each item in the below checklist:  . Regimen for the patient is scheduled for the appropriate day and plan matches scheduled date. Marland Kitchen Appropriate non-routine labs are ordered dependent on drug ordered. . If applicable, additional medications reviewed and ordered per protocol based on lifetime cumulative doses and/or treatment regimen.   Plan for follow-up and/or issues identified: No . I-vent associated with next due treatment: No . MD and/or nursing notified: No  Mary [name] [facility] 01/15/2020 10:32 AM

## 2020-01-17 ENCOUNTER — Other Ambulatory Visit: Payer: Self-pay | Admitting: Oncology

## 2020-01-17 MED ORDER — MIRTAZAPINE 7.5 MG PO TABS: 7.5000 mg | ORAL_TABLET | Freq: Every day | ORAL | 0 refills | Status: DC

## 2020-01-21 ENCOUNTER — Inpatient Hospital Stay: Payer: Medicare Other

## 2020-01-21 ENCOUNTER — Encounter: Payer: Self-pay | Admitting: Nurse Practitioner

## 2020-01-21 ENCOUNTER — Other Ambulatory Visit: Payer: Medicare Other

## 2020-01-21 ENCOUNTER — Other Ambulatory Visit: Payer: Self-pay

## 2020-01-21 ENCOUNTER — Inpatient Hospital Stay (HOSPITAL_BASED_OUTPATIENT_CLINIC_OR_DEPARTMENT_OTHER): Payer: Medicare Other | Admitting: Nurse Practitioner

## 2020-01-21 ENCOUNTER — Inpatient Hospital Stay: Payer: Medicare Other | Attending: Nurse Practitioner

## 2020-01-21 VITALS — BP 140/75 | HR 100 | Temp 98.0°F | Resp 18 | Wt 125.1 lb

## 2020-01-21 DIAGNOSIS — Z5111 Encounter for antineoplastic chemotherapy: Secondary | ICD-10-CM | POA: Diagnosis present

## 2020-01-21 DIAGNOSIS — C250 Malignant neoplasm of head of pancreas: Secondary | ICD-10-CM | POA: Insufficient documentation

## 2020-01-21 DIAGNOSIS — Z95828 Presence of other vascular implants and grafts: Secondary | ICD-10-CM

## 2020-01-21 LAB — CBC WITH DIFFERENTIAL (CANCER CENTER ONLY)
Abs Immature Granulocytes: 0.02 10*3/uL (ref 0.00–0.07)
Basophils Absolute: 0 10*3/uL (ref 0.0–0.1)
Basophils Relative: 1 %
Eosinophils Absolute: 0.3 10*3/uL (ref 0.0–0.5)
Eosinophils Relative: 4 %
HCT: 35.7 % — ABNORMAL LOW (ref 36.0–46.0)
Hemoglobin: 11.8 g/dL — ABNORMAL LOW (ref 12.0–15.0)
Immature Granulocytes: 0 %
Lymphocytes Relative: 36 %
Lymphs Abs: 2.3 10*3/uL (ref 0.7–4.0)
MCH: 30.5 pg (ref 26.0–34.0)
MCHC: 33.1 g/dL (ref 30.0–36.0)
MCV: 92.2 fL (ref 80.0–100.0)
Monocytes Absolute: 0.6 10*3/uL (ref 0.1–1.0)
Monocytes Relative: 10 %
Neutro Abs: 3.2 10*3/uL (ref 1.7–7.7)
Neutrophils Relative %: 49 %
Platelet Count: 302 10*3/uL (ref 150–400)
RBC: 3.87 MIL/uL (ref 3.87–5.11)
RDW: 14.3 % (ref 11.5–15.5)
WBC Count: 6.4 10*3/uL (ref 4.0–10.5)
nRBC: 0 % (ref 0.0–0.2)

## 2020-01-21 LAB — CMP (CANCER CENTER ONLY)
ALT: 27 U/L (ref 0–44)
AST: 31 U/L (ref 15–41)
Albumin: 3.3 g/dL — ABNORMAL LOW (ref 3.5–5.0)
Alkaline Phosphatase: 141 U/L — ABNORMAL HIGH (ref 38–126)
Anion gap: 11 (ref 5–15)
BUN: 12 mg/dL (ref 8–23)
CO2: 26 mmol/L (ref 22–32)
Calcium: 9.5 mg/dL (ref 8.9–10.3)
Chloride: 105 mmol/L (ref 98–111)
Creatinine: 1.03 mg/dL — ABNORMAL HIGH (ref 0.44–1.00)
GFR, Est AFR Am: >60 mL/min (ref >60)
GFR, Estimated: 56 mL/min — ABNORMAL LOW (ref >60)
Glucose, Bld: 129 mg/dL — ABNORMAL HIGH (ref 70–99)
Potassium: 4.3 mmol/L (ref 3.5–5.1)
Sodium: 142 mmol/L (ref 135–145)
Total Bilirubin: 0.6 mg/dL (ref 0.3–1.2)
Total Protein: 6.7 g/dL (ref 6.5–8.1)

## 2020-01-21 MED ORDER — HEPARIN SOD (PORK) LOCK FLUSH 100 UNIT/ML IV SOLN
500.0000 [IU] | Freq: Once | INTRAVENOUS | Status: AC | PRN
  Administered 2020-01-21: 500 [IU]
  Filled 2020-01-21: qty 5

## 2020-01-21 MED ORDER — SODIUM CHLORIDE 0.9 % IV SOLN
Freq: Once | INTRAVENOUS | Status: DC
  Filled 2020-01-21: qty 250

## 2020-01-21 MED ORDER — PACLITAXEL PROTEIN-BOUND CHEMO INJECTION 100 MG
100.0000 mg/m2 | Freq: Once | INTRAVENOUS | Status: AC
  Administered 2020-01-21: 150 mg via INTRAVENOUS
  Filled 2020-01-21: qty 30

## 2020-01-21 MED ORDER — SODIUM CHLORIDE 0.9% FLUSH
10.0000 mL | INTRAVENOUS | Status: DC | PRN
  Administered 2020-01-21: 10 mL
  Filled 2020-01-21: qty 10

## 2020-01-21 MED ORDER — SODIUM CHLORIDE 0.9 % IV SOLN
Freq: Once | INTRAVENOUS | Status: AC
  Filled 2020-01-21: qty 250

## 2020-01-21 MED ORDER — SODIUM CHLORIDE 0.9 % IV SOLN
1000.0000 mg/m2 | Freq: Once | INTRAVENOUS | Status: AC
  Administered 2020-01-21: 1596 mg via INTRAVENOUS
  Filled 2020-01-21: qty 41.98

## 2020-01-21 MED ORDER — PROCHLORPERAZINE MALEATE 10 MG PO TABS
ORAL_TABLET | ORAL | Status: AC
  Filled 2020-01-21: qty 1

## 2020-01-21 MED ORDER — PROCHLORPERAZINE MALEATE 10 MG PO TABS
10.0000 mg | ORAL_TABLET | Freq: Once | ORAL | Status: AC
  Administered 2020-01-21: 10 mg via ORAL

## 2020-01-21 NOTE — Progress Notes (Signed)
[name]   Diagnosis: Pancreas cancer  INTERVAL HISTORY:   Mary [name] returns as scheduled.  She completed cycle 1 gemcitabine/Abraxane 01/07/2020.  She denies significant nausea/vomiting.  No mouth sores.  No diarrhea.  She continues to have issues with constipation.  She is taking MiraLAX and a stool softener daily with magnesium citrate as needed.  No fever or rash following treatment.  No cough or shortness of breath.  She felt "blah" for a few days after treatment.  She is intermittently weak.  Appetite varies.  Objective:  Vital signs in last 24 hours:  Blood pressure 140/75, pulse 100, temperature 98 F (36.7 C), temperature source Temporal, resp. rate 18, weight 125 lb 1.6 oz (56.7 kg), SpO2 98 %.    HEENT: No thrush or ulcers. Resp: Lungs clear bilaterally. Cardio: Regular rhythm, mild tachycardia. GI: Abdomen soft and nontender.  No hepatomegaly.  No mass. Vascular: No leg edema.  Calves soft and nontender.  Skin: No rash. Port-A-Cath without erythema.   Lab Results:  Lab Results  Component Value Date   WBC 6.4 01/21/2020   HGB 11.8 (L) 01/21/2020   HCT 35.7 (L) 01/21/2020   MCV 92.2 01/21/2020   PLT 302 01/21/2020   NEUTROABS 3.2 01/21/2020    Imaging:  No results found.  Medications: I have reviewed the patient's current medications.  Assessment/Plan: 1. 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  -12/02/19 MRI of the abdomen - showed a 18 x17 mm mass in the head of the pancreas with pancreatic duct and CBD dilatation. There are small cysts noted in the right liver lobe, otherwise no evidence of hepatic mets or other metastatic disease in the abdomen. -CA 19-9 1349    -12/11/19 EUS/FNA per Dr. Ardis Hughs - irregular hypoechoic 3.4 cm mass in the pancreatic head with a 1.5 cm segment of invasion into the portal vein. There was no peripancreatic adenopathy.  -Pathology showed malignant cells consistent with adenocarcinoma -12/11/19 ERCP with placement of metallic biliary stent of malignant stricture and obstruction of the distal CBD.  -CT chest 12/24/2019, multiple nonspecific lung nodules, indeterminate low-attenuation lesion in segment 7 of the liver, enlarged porta hepatis node, hypodense nodule at the isthmus of the thyroid gland -CT abdomen/pelvis 12/29/2019, 3.1 cm pancreas body mass extending along 200 degrees of the anterior distal portal vein with narrowing of the portal vein, mass involves the right gastric artery, mildly enlarged porta hepatis node, segment 8 liver lesion-nonspecific             -CA 19-9 on 12/29/19 2298             -Cycle 1 gemcitabine/abraxane on 01/07/20   -Cycle 2 gemcitabine/Abraxane 01/21/2020  2. Abdominal discomfort, nausea, constipation, secondary to #1  -s/p ERCP and stenting of malignant stricture of distal CBD  -We reviewed bowel regimen and pain control. Given tramadol PRN on 12/15/19   3. Transaminitis and hyperbilirubinemia, secondary to #1 12/04/19 Bili 3.3, ALT 538 AST 527 and ALK PHOS 414. -12/11/19 Tbili was elevated to 6.4  -juandice resolved after ERCP and stenting   4. Anorexia and weight loss -presented with 15 lbs weight loss, secondary to #1 -she has been referred to dietician   5. Genetics  -Her family history is not strongly suggestive of an inheritable cancer syndrome.  -Due  to her personal history of pancreas cancer, she qualifies for genetics, r/o BRCA mutation.  -If positive, she would be a candidate for PARP inhibitor in  the future              -collected 12/29/19, negative for BRCA mutation  6. Vaccinations, s/p both doses of COVID19 vaccine (moderna)   Disposition: Mary [name] appears stable.  She has completed one cycle of gemcitabine/Abraxane.  Overall she tolerated well.  Plan to proceed with cycle 2 today as scheduled.  We reviewed the CBC from today.  Counts adequate to proceed with treatment.  She continues to have issues with constipation.  She will continue MiraLAX and add Senokot-S 1 to 2 tablets twice daily, magnesium citrate if needed.  She will return for lab, follow-up, cycle 2 gemcitabine/Abraxane in 2 weeks.  She will contact the office in the interim with any problems.    [name] ANP/GNP-BC   01/21/2020  9:51 AM

## 2020-01-21 NOTE — Patient Instructions (Signed)
[place] Discharge Instructions for Patients Receiving Chemotherapy  Today you received the following chemotherapy agents: Paclitaxel and Gemcitabine  To help prevent nausea and vomiting after your treatment, we encourage you to take your nausea medication as directed by your MD.   If you develop nausea and vomiting that is not controlled by your nausea medication, call the clinic.   BELOW ARE SYMPTOMS THAT SHOULD BE REPORTED IMMEDIATELY:  *FEVER GREATER THAN 100.5 F  *CHILLS WITH OR WITHOUT FEVER  NAUSEA AND VOMITING THAT IS NOT CONTROLLED WITH YOUR NAUSEA MEDICATION  *UNUSUAL SHORTNESS OF BREATH  *UNUSUAL BRUISING OR BLEEDING  TENDERNESS IN MOUTH AND THROAT WITH OR WITHOUT PRESENCE OF ULCERS  *URINARY PROBLEMS  *BOWEL PROBLEMS  UNUSUAL RASH Items with * indicate a potential emergency and should be followed up as soon as possible.  Feel free to call the clinic should you have any questions or concerns. The clinic phone number is [phone].  Please show the [place] at check-in to the Emergency Department and triage nurse.  Coronavirus (COVID-19) Are you at risk?  Are you at risk for the Coronavirus (COVID-19)?  To be considered HIGH RISK for Coronavirus (COVID-19), you have to meet the following criteria:  . Traveled to [place], [place], [place], [place] or [place]; or in the [place] to [place], [place], [place], or [place]; and have fever, cough, and shortness of breath within the last 2 weeks of travel OR . Been in close contact with a person diagnosed with COVID-19 within the last 2 weeks and have fever, cough, and shortness of breath . IF YOU DO NOT MEET THESE CRITERIA, YOU ARE CONSIDERED LOW RISK FOR COVID-19.  What to do if you are HIGH RISK for COVID-19?  Marland Kitchen If you are having a medical emergency, call 911. . Seek medical care right away. Before you go to a doctor's office, urgent care or emergency department, call ahead  and tell them about your recent travel, contact with someone diagnosed with COVID-19, and your symptoms. You should receive instructions from your physician's office regarding next steps of care.  . When you arrive at healthcare provider, tell the healthcare staff immediately you have returned from visiting [place], [place], [place], [place] or [place]; or traveled in the [place] to [place], [place], [place], or [place]; in the last two weeks or you have been in close contact with a person diagnosed with COVID-19 in the last 2 weeks.   . Tell the health care staff about your symptoms: fever, cough and shortness of breath. . After you have been seen by a medical provider, you will be either: o Tested for (COVID-19) and discharged home on quarantine except to seek medical care if symptoms worsen, and asked to  - Stay home and avoid contact with others until you get your results (4-5 days)  - Avoid travel on public transportation if possible (such as bus, train, or airplane) or o Sent to the Emergency Department by EMS for evaluation, COVID-19 testing, and possible admission depending on your condition and test results.  What to do if you are LOW RISK for COVID-19?  Reduce your risk of any infection by using the same precautions used for avoiding the common cold or flu:  Marland Kitchen Wash your hands often with soap and warm water for at least 20 seconds.  If soap and water are not readily available, use an alcohol-based hand sanitizer with at least 60% alcohol.  Marland Kitchen  If coughing or sneezing, cover your mouth and nose by coughing or sneezing into the elbow areas of your shirt or coat, into a tissue or into your sleeve (not your hands). . Avoid shaking hands with others and consider head nods or verbal greetings only. . Avoid touching your eyes, nose, or mouth with unwashed hands.  . Avoid close contact with people who are sick. . Avoid places or events with large numbers of people in one location, like  concerts or sporting events. . Carefully consider travel plans you have or are making. . If you are planning any travel outside or inside the [place], visit the CDC's Travelers' Health webpage for the latest health notices. . If you have some symptoms but not all symptoms, continue to monitor at home and seek medical attention if your symptoms worsen. . If you are having a medical emergency, call 911.   ADDITIONAL HEALTHCARE OPTIONS FOR PATIENTS  [facility] / e-Visit: [email or web address]         [facility]: [phone]  [facility]: [phone]                   [facility]: [phone]  

## 2020-01-22 LAB — CANCER ANTIGEN 19-9: CA 19-9: 2179 U/mL — ABNORMAL HIGH (ref 0–35)

## 2020-01-29 NOTE — Progress Notes (Signed)
Pharmacist Chemotherapy Monitoring - Follow Up Assessment    I verify that I have reviewed each item in the below checklist:  . Regimen for the patient is scheduled for the appropriate day and plan matches scheduled date. Marland Kitchen Appropriate non-routine labs are ordered dependent on drug ordered. . If applicable, additional medications reviewed and ordered per protocol based on lifetime cumulative doses and/or treatment regimen.   Plan for follow-up and/or issues identified: No . I-vent associated with next due treatment: No . MD and/or nursing notified: No  Kassey Laforest D 01/29/2020 12:33 PM

## 2020-01-29 NOTE — Progress Notes (Signed)
Pharmacist Chemotherapy Monitoring - Follow Up Assessment    I verify that I have reviewed each item in the below checklist:  . Regimen for the patient is scheduled for the appropriate day and plan matches scheduled date. Marland Kitchen Appropriate non-routine labs are ordered dependent on drug ordered. . If applicable, additional medications reviewed and ordered per protocol based on lifetime cumulative doses and/or treatment regimen.   Plan for follow-up and/or issues identified: No . I-vent associated with next due treatment: No . MD and/or nursing notified: No  Mary [name] D 01/29/2020 2:28 PM

## 2020-01-31 ENCOUNTER — Other Ambulatory Visit: Payer: Self-pay | Admitting: Oncology

## 2020-02-04 ENCOUNTER — Inpatient Hospital Stay: Payer: Medicare Other

## 2020-02-04 ENCOUNTER — Inpatient Hospital Stay (HOSPITAL_BASED_OUTPATIENT_CLINIC_OR_DEPARTMENT_OTHER): Payer: Medicare Other | Admitting: Nurse Practitioner

## 2020-02-04 ENCOUNTER — Other Ambulatory Visit: Payer: Self-pay

## 2020-02-04 ENCOUNTER — Inpatient Hospital Stay: Payer: Medicare Other | Admitting: Nutrition

## 2020-02-04 ENCOUNTER — Encounter: Payer: Self-pay | Admitting: Nurse Practitioner

## 2020-02-04 VITALS — BP 134/80 | HR 95 | Temp 97.5°F | Resp 17 | Ht 61.0 in | Wt 125.5 lb

## 2020-02-04 DIAGNOSIS — Z5111 Encounter for antineoplastic chemotherapy: Secondary | ICD-10-CM | POA: Diagnosis not present

## 2020-02-04 DIAGNOSIS — C250 Malignant neoplasm of head of pancreas: Secondary | ICD-10-CM

## 2020-02-04 LAB — CBC WITH DIFFERENTIAL (CANCER CENTER ONLY)
Abs Immature Granulocytes: 0.03 10*3/uL (ref 0.00–0.07)
Basophils Absolute: 0.1 10*3/uL (ref 0.0–0.1)
Basophils Relative: 1 %
Eosinophils Absolute: 0.3 10*3/uL (ref 0.0–0.5)
Eosinophils Relative: 5 %
HCT: 35.5 % — ABNORMAL LOW (ref 36.0–46.0)
Hemoglobin: 11.6 g/dL — ABNORMAL LOW (ref 12.0–15.0)
Immature Granulocytes: 0 %
Lymphocytes Relative: 32 %
Lymphs Abs: 2.2 10*3/uL (ref 0.7–4.0)
MCH: 31.1 pg (ref 26.0–34.0)
MCHC: 32.7 g/dL (ref 30.0–36.0)
MCV: 95.2 fL (ref 80.0–100.0)
Monocytes Absolute: 0.7 10*3/uL (ref 0.1–1.0)
Monocytes Relative: 10 %
Neutro Abs: 3.5 10*3/uL (ref 1.7–7.7)
Neutrophils Relative %: 52 %
Platelet Count: 250 10*3/uL (ref 150–400)
RBC: 3.73 MIL/uL — ABNORMAL LOW (ref 3.87–5.11)
RDW: 15.6 % — ABNORMAL HIGH (ref 11.5–15.5)
WBC Count: 6.8 10*3/uL (ref 4.0–10.5)
nRBC: 0 % (ref 0.0–0.2)

## 2020-02-04 LAB — CMP (CANCER CENTER ONLY)
ALT: 28 U/L (ref 0–44)
AST: 32 U/L (ref 15–41)
Albumin: 3.3 g/dL — ABNORMAL LOW (ref 3.5–5.0)
Alkaline Phosphatase: 126 U/L (ref 38–126)
Anion gap: 10 (ref 5–15)
BUN: 13 mg/dL (ref 8–23)
CO2: 26 mmol/L (ref 22–32)
Calcium: 9.3 mg/dL (ref 8.9–10.3)
Chloride: 105 mmol/L (ref 98–111)
Creatinine: 0.94 mg/dL (ref 0.44–1.00)
GFR, Est AFR Am: >60 mL/min (ref >60)
GFR, Estimated: >60 mL/min (ref >60)
Glucose, Bld: 163 mg/dL — ABNORMAL HIGH (ref 70–99)
Potassium: 4 mmol/L (ref 3.5–5.1)
Sodium: 141 mmol/L (ref 135–145)
Total Bilirubin: 0.5 mg/dL (ref 0.3–1.2)
Total Protein: 6.6 g/dL (ref 6.5–8.1)

## 2020-02-04 MED ORDER — HEPARIN SOD (PORK) LOCK FLUSH 100 UNIT/ML IV SOLN
500.0000 [IU] | Freq: Once | INTRAVENOUS | Status: AC | PRN
  Administered 2020-02-04: 500 [IU]
  Filled 2020-02-04: qty 5

## 2020-02-04 MED ORDER — PROCHLORPERAZINE MALEATE 10 MG PO TABS
ORAL_TABLET | ORAL | Status: AC
  Filled 2020-02-04: qty 1

## 2020-02-04 MED ORDER — SODIUM CHLORIDE 0.9 % IV SOLN
1000.0000 mg/m2 | Freq: Once | INTRAVENOUS | Status: AC
  Administered 2020-02-04: 1596 mg via INTRAVENOUS
  Filled 2020-02-04: qty 41.98

## 2020-02-04 MED ORDER — SODIUM CHLORIDE 0.9% FLUSH
10.0000 mL | INTRAVENOUS | Status: DC | PRN
  Administered 2020-02-04: 10 mL
  Filled 2020-02-04: qty 10

## 2020-02-04 MED ORDER — PROCHLORPERAZINE MALEATE 10 MG PO TABS
10.0000 mg | ORAL_TABLET | Freq: Once | ORAL | Status: AC
  Administered 2020-02-04: 10 mg via ORAL

## 2020-02-04 MED ORDER — SODIUM CHLORIDE 0.9 % IV SOLN
Freq: Once | INTRAVENOUS | Status: AC
  Filled 2020-02-04: qty 250

## 2020-02-04 MED ORDER — PACLITAXEL PROTEIN-BOUND CHEMO INJECTION 100 MG
100.0000 mg/m2 | Freq: Once | INTRAVENOUS | Status: AC
  Administered 2020-02-04: 150 mg via INTRAVENOUS
  Filled 2020-02-04: qty 30

## 2020-02-04 MED ORDER — SODIUM CHLORIDE 0.9 % IV SOLN
Freq: Once | INTRAVENOUS | Status: DC
  Filled 2020-02-04: qty 250

## 2020-02-04 NOTE — Progress Notes (Signed)
Nutrition Follow-up: Met with patient during infusion for adenocarcinoma of head of pancreas.   Cylcle 3 - Gemcitabine/Abraxane, followed by Dr. Benay Spice  Patient reports decreased appetite, states that most foods taste bland. She recalls having chicken casserole, deviled eggs, lima beans, and jello salad for dinner last night, states that she enjoyed the meal but just had a few bites of each thing before feeling full. She continues to drink 1 high calorie Boost daily, due to it tasting chalky her husband has been pouring half into a cup mixed with full fat chocolate milk which she likes and will drink the other half later in the day. Patient reports that she likes strawberry and peach flavored yogurt and sweet and sour chicken. She is drinking mostly [place], [place]. Dew, and lemonade, states that water does not taste good to her and usually drinks 2 small bottles daily. Patient's husband is very active in her care, he journals her daily intake with total  calories consumed. Patient brought journal with her today and RD reviewed. Patient is fairly consistently consuming 1500-1700 kcals/day, noted a few days with 500-700 kcals. Patient states that she has very little appetite for 3-4 days after treatment. Patient denies constipation, reports regular BMs with bowel regimen.  Medications: Remeron, Senokot, Miralax  Labs: Hgb 11.6, BG 163  Anthropometrics:   Weight is 125 lb 8 oz on 5/20 which she has maintained from last weight on 5/08, decreased from 126 lb on 4/22 and 127 lb on 4/13.    NUTRITION DIAGNOSIS: Inadequate oral intake improving    INTERVENTION:  Discussed weighs to add calories and protein Suggested adding extra seasonings/sauces to foods for enhancing food flavors  Educated on importance of adequate hydration, provided suggestions on how to add flavoring to water Continue drinking high calorie Boost supplement, recommended 2 daily  Encouraged continuing small frequent meals Wrote all  recommendations/suggestions in patients notebook per her request    MONITORING, EVALUATION, GOAL: Patient will consume adequate calories and protein prevent weight loss   NEXT VISIT: To be determined with treatment  Lajuan Lines, RD, LDN Clinical Nutrition After Hours/Weekend Pager # in Advance

## 2020-02-04 NOTE — Progress Notes (Addendum)
[place] OFFICE PROGRESS NOTE   Diagnosis: Pancreas cancer  INTERVAL HISTORY:   Ms. Mcadams returns as scheduled.  She completed cycle 2 gemcitabine/Abraxane 01/21/2020.  She had a single episode of nausea, no vomiting.  Diminished appetite for 4 to 5 days after treatment.  No mouth sores.  No diarrhea.  On the second or third day after treatment she felt hot, temperature 100.4 degrees.  She took Tylenol with no further issues.  No rash.  No numbness or tingling in the hands or feet.  She continues to have difficulty falling asleep.  Currently taking Remeron 7.5 mg at bedtime.  Objective:  Vital signs in last 24 hours:  Blood pressure 134/80, pulse 95, temperature (!) 97.5 F (36.4 C), temperature source Temporal, resp. rate 17, height 5' 1"  (1.549 m), weight 125 lb 8 oz (56.9 kg), SpO2 98 %.    HEENT: No thrush or ulcers. Resp: Lungs clear bilaterally. Cardio: Regular rate and rhythm. GI: Abdomen soft and nontender.  No hepatomegaly.  No mass. Vascular: No leg edema. Neuro: Alert and oriented. Skin: No rash. Port-A-Cath without erythema.   Lab Results:  Lab Results  Component Value Date   WBC 6.8 02/04/2020   HGB 11.6 (L) 02/04/2020   HCT 35.5 (L) 02/04/2020   MCV 95.2 02/04/2020   PLT 250 02/04/2020   NEUTROABS 3.5 02/04/2020    Imaging:  No results found.  Medications: I have reviewed the patient's current medications.  Assessment/Plan: 1. 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  -12/02/19 MRI of the abdomen - showed a 18 x17 mm mass in the head of the pancreas with pancreatic duct and CBD dilatation. There are small cysts noted in the right liver lobe, otherwise no evidence of hepatic mets or other metastatic disease in  the abdomen. -CA 19-9 1349  -12/11/19 EUS/FNA per Dr. Ardis Hughs - irregular hypoechoic 3.4 cm mass in the pancreatic head with a 1.5 cm segment of invasion into the portal vein. There was no peripancreatic adenopathy.  -Pathology showed malignant cells consistent with adenocarcinoma -12/11/19 ERCP with placement of metallic biliary stent of malignant stricture and obstruction of the distal CBD.  -CT chest 12/24/2019, multiple nonspecific lung nodules, indeterminate low-attenuation lesion in segment 7 of the liver, enlarged porta hepatis node, hypodense nodule at the isthmus of the thyroid gland -CT abdomen/pelvis 12/29/2019, 3.1 cm pancreas body mass extending along 200 degrees of the anterior distal portal vein with narrowing of the portal vein, mass involves the right gastric artery, mildly enlarged porta hepatis node, segment 8 liver lesion-nonspecific -CA 19-9 on 12/29/19 2298 -Cycle 1 gemcitabine/abraxane on 01/07/20              -Cycle 2 gemcitabine/Abraxane 01/21/2020  -Cycle 3 gemcitabine/Abraxane 02/04/2020  2. Abdominal discomfort, nausea, constipation, secondary to #1  -s/p ERCP and stenting of malignant stricture of distal CBD  -We reviewed bowel regimen and pain control. Given tramadol PRN on 12/15/19   3. Transaminitis and hyperbilirubinemia, secondary to #1 12/04/19 Bili 3.3, ALT 538 AST 527 and ALK PHOS 414. -12/11/19 Tbili was elevated to 6.4  -juandice resolved after ERCP and stenting   4. Anorexia and weight loss -presented with 15 lbs weight loss, secondary to #1 -she has been referred to dietician   5. Genetics  -Her family history is not strongly suggestive  of an inheritable cancer syndrome.  -Due to her personal history of pancreas cancer, she qualifies for genetics,  r/o BRCA mutation.  -If positive, she would be a candidate for PARP inhibitor in the future -collected 12/29/19, negative for BRCA mutation  6. Vaccinations, s/p both doses of COVID19 vaccine (moderna)    Disposition: Ms. Tun appears stable.  She has completed 2 cycles of gemcitabine/Abraxane.  Overall she is tolerating well.  Plan to proceed with cycle 3 today as scheduled.  She will be referred for restaging CTs after completing 5 or 6 cycles.  We reviewed the CBC and chemistry panel from today.  Labs adequate to proceed with treatment.  She will increase Remeron from 7.5 mg at bedtime to 15 mg to see if this helps with sleep as well as appetite.  She will return for lab, follow-up, cycle 4 gemcitabine/Abraxane in 2 weeks.  She will contact the office in the interim with any problems.  Patient seen with Dr. Benay Spice.  [name] ANP/GNP-BC   02/04/2020  10:57 AM  This was a shared visit with [name].  Ms. Andersson is tolerating the gemcitabine/Abraxane well and the abdominal pain has improved.  The plan is to continue gemcitabine/Abraxane.  Julieanne Manson, MD

## 2020-02-04 NOTE — Patient Instructions (Signed)
[facility] Discharge Instructions for Patients Receiving Chemotherapy  Today you received the following chemotherapy agents:  Protein bound Paclitaxel & Gemcitabine  To help prevent nausea and vomiting after your treatment, we encourage you to take your nausea medication as prescribed.    If you develop nausea and vomiting that is not controlled by your nausea medication, call the clinic.   BELOW ARE SYMPTOMS THAT SHOULD BE REPORTED IMMEDIATELY:  *FEVER GREATER THAN 100.5 F  *CHILLS WITH OR WITHOUT FEVER  NAUSEA AND VOMITING THAT IS NOT CONTROLLED WITH YOUR NAUSEA MEDICATION  *UNUSUAL SHORTNESS OF BREATH  *UNUSUAL BRUISING OR BLEEDING  TENDERNESS IN MOUTH AND THROAT WITH OR WITHOUT PRESENCE OF ULCERS  *URINARY PROBLEMS  *BOWEL PROBLEMS  UNUSUAL RASH Items with * indicate a potential emergency and should be followed up as soon as possible.  Feel free to call the clinic should you have any questions or concerns. The clinic phone number is [phone].  Please show the [place] at check-in to the Emergency Department and triage nurse.

## 2020-02-05 ENCOUNTER — Telehealth: Payer: Self-pay | Admitting: Oncology

## 2020-02-05 NOTE — Telephone Encounter (Signed)
Scheduled per 5/20 los. Spoke with pt's son. Pt aware of appts and noted to give pt updated calendar for next tx.

## 2020-02-08 ENCOUNTER — Other Ambulatory Visit: Payer: Self-pay | Admitting: Nurse Practitioner

## 2020-02-12 ENCOUNTER — Other Ambulatory Visit: Payer: Self-pay | Admitting: Nurse Practitioner

## 2020-02-12 MED ORDER — MIRTAZAPINE 7.5 MG PO TABS: 7.5000 mg | ORAL_TABLET | Freq: Every day | ORAL | 0 refills | Status: DC

## 2020-02-12 NOTE — Progress Notes (Signed)
Pharmacist Chemotherapy Monitoring - Follow Up Assessment    I verify that I have reviewed each item in the below checklist:   Regimen for the patient is scheduled for the appropriate day and plan matches scheduled date.  Appropriate non-routine labs are ordered dependent on drug ordered.  If applicable, additional medications reviewed and ordered per protocol based on lifetime cumulative doses and/or treatment regimen.   Plan for follow-up and/or issues identified: No  I-vent associated with next due treatment: No  MD and/or nursing notified: No  Mary [name] D 02/12/2020 1:24 PM

## 2020-02-14 ENCOUNTER — Other Ambulatory Visit: Payer: Self-pay | Admitting: Oncology

## 2020-02-18 ENCOUNTER — Other Ambulatory Visit: Payer: Self-pay | Admitting: Nurse Practitioner

## 2020-02-19 ENCOUNTER — Inpatient Hospital Stay: Payer: Medicare Other | Attending: Nurse Practitioner | Admitting: Nurse Practitioner

## 2020-02-19 ENCOUNTER — Inpatient Hospital Stay: Payer: Medicare Other

## 2020-02-19 ENCOUNTER — Other Ambulatory Visit: Payer: Self-pay

## 2020-02-19 ENCOUNTER — Encounter: Payer: Self-pay | Admitting: Nurse Practitioner

## 2020-02-19 VITALS — BP 134/62 | HR 87 | Temp 97.7°F | Resp 18 | Ht 61.0 in | Wt 126.3 lb

## 2020-02-19 DIAGNOSIS — Z5111 Encounter for antineoplastic chemotherapy: Secondary | ICD-10-CM | POA: Insufficient documentation

## 2020-02-19 DIAGNOSIS — C250 Malignant neoplasm of head of pancreas: Secondary | ICD-10-CM

## 2020-02-19 LAB — CBC WITH DIFFERENTIAL (CANCER CENTER ONLY)
Abs Immature Granulocytes: 0.03 10*3/uL (ref 0.00–0.07)
Basophils Absolute: 0.1 10*3/uL (ref 0.0–0.1)
Basophils Relative: 1 %
Eosinophils Absolute: 0.3 10*3/uL (ref 0.0–0.5)
Eosinophils Relative: 4 %
HCT: 34.7 % — ABNORMAL LOW (ref 36.0–46.0)
Hemoglobin: 11.4 g/dL — ABNORMAL LOW (ref 12.0–15.0)
Immature Granulocytes: 0 %
Lymphocytes Relative: 29 %
Lymphs Abs: 2.3 10*3/uL (ref 0.7–4.0)
MCH: 32 pg (ref 26.0–34.0)
MCHC: 32.9 g/dL (ref 30.0–36.0)
MCV: 97.5 fL (ref 80.0–100.0)
Monocytes Absolute: 0.6 10*3/uL (ref 0.1–1.0)
Monocytes Relative: 8 %
Neutro Abs: 4.6 10*3/uL (ref 1.7–7.7)
Neutrophils Relative %: 58 %
Platelet Count: 268 10*3/uL (ref 150–400)
RBC: 3.56 MIL/uL — ABNORMAL LOW (ref 3.87–5.11)
RDW: 16.2 % — ABNORMAL HIGH (ref 11.5–15.5)
WBC Count: 7.9 10*3/uL (ref 4.0–10.5)
nRBC: 0 % (ref 0.0–0.2)

## 2020-02-19 LAB — CMP (CANCER CENTER ONLY)
ALT: 107 U/L — ABNORMAL HIGH (ref 0–44)
AST: 94 U/L — ABNORMAL HIGH (ref 15–41)
Albumin: 3.4 g/dL — ABNORMAL LOW (ref 3.5–5.0)
Alkaline Phosphatase: 171 U/L — ABNORMAL HIGH (ref 38–126)
Anion gap: 10 (ref 5–15)
BUN: 13 mg/dL (ref 8–23)
CO2: 26 mmol/L (ref 22–32)
Calcium: 9.6 mg/dL (ref 8.9–10.3)
Chloride: 103 mmol/L (ref 98–111)
Creatinine: 0.92 mg/dL (ref 0.44–1.00)
GFR, Est AFR Am: >60 mL/min (ref >60)
GFR, Estimated: >60 mL/min (ref >60)
Glucose, Bld: 115 mg/dL — ABNORMAL HIGH (ref 70–99)
Potassium: 4.3 mmol/L (ref 3.5–5.1)
Sodium: 139 mmol/L (ref 135–145)
Total Bilirubin: 0.6 mg/dL (ref 0.3–1.2)
Total Protein: 6.9 g/dL (ref 6.5–8.1)

## 2020-02-19 MED ORDER — SODIUM CHLORIDE 0.9 % IV SOLN
1000.0000 mg/m2 | Freq: Once | INTRAVENOUS | Status: AC
  Administered 2020-02-19: 1596 mg via INTRAVENOUS
  Filled 2020-02-19: qty 41.98

## 2020-02-19 MED ORDER — PROCHLORPERAZINE MALEATE 10 MG PO TABS
ORAL_TABLET | ORAL | Status: AC
  Filled 2020-02-19: qty 1

## 2020-02-19 MED ORDER — PROCHLORPERAZINE MALEATE 10 MG PO TABS
10.0000 mg | ORAL_TABLET | Freq: Once | ORAL | Status: AC
  Administered 2020-02-19: 10 mg via ORAL

## 2020-02-19 MED ORDER — SODIUM CHLORIDE 0.9 % IV SOLN
Freq: Once | INTRAVENOUS | Status: AC
  Filled 2020-02-19: qty 250

## 2020-02-19 MED ORDER — PACLITAXEL PROTEIN-BOUND CHEMO INJECTION 100 MG
100.0000 mg/m2 | Freq: Once | INTRAVENOUS | Status: AC
  Administered 2020-02-19: 150 mg via INTRAVENOUS
  Filled 2020-02-19: qty 30

## 2020-02-19 MED ORDER — SODIUM CHLORIDE 0.9% FLUSH
10.0000 mL | INTRAVENOUS | Status: DC | PRN
  Administered 2020-02-19: 10 mL
  Filled 2020-02-19: qty 10

## 2020-02-19 MED ORDER — HEPARIN SOD (PORK) LOCK FLUSH 100 UNIT/ML IV SOLN
500.0000 [IU] | Freq: Once | INTRAVENOUS | Status: AC | PRN
  Administered 2020-02-19: 500 [IU]
  Filled 2020-02-19: qty 5

## 2020-02-19 NOTE — Progress Notes (Signed)
Spoke w/ Lattie Haw - proceed with same doses of gemcitabine/Abraxane today as previous cycles. No dose reductions at this time. Plan reviewed by Dr. Burr Medico.   Demetrius Charity, PharmD, BCPS, [facility] Pharmacist Pharmacy Phone: [phone] 02/19/2020

## 2020-02-19 NOTE — Progress Notes (Addendum)
Mary [name]   Diagnosis: Pancreas cancer  INTERVAL HISTORY:   Mary [name] returns as scheduled.  She completed cycle 3 gemcitabine/Abraxane 02/04/2020.  She denies nausea/vomiting.  No mouth sores.  She has mild constipation.  No shortness of breath or cough.  No rash following treatment.  She "felt bad" for 3 to 4 days after the last treatment.  She was "achy" and had a low-grade fever on 1 occasion 1 to 2 days after treatment.  She has intermittent "spasms" at the low abdomen, relieved with Bentyl.  Pain continues to be improved.  She has mild intermittent numbness in the feet.  Objective:  Vital signs in last 24 hours:  Blood pressure 134/62, pulse 87, temperature 97.7 F (36.5 C), temperature source Temporal, resp. rate 18, height 5' 1"  (1.549 m), weight 126 lb 4.8 oz (57.3 kg), SpO2 97 %.    HEENT: Mild white coating over tongue.  No buccal thrush. Resp: Lungs clear bilaterally. Cardio: Regular rate and rhythm. GI: Abdomen soft and nontender.  No hepatomegaly. Vascular: No leg edema. Neuro: Vibratory sense very mildly decreased over the fingertips per tuning fork exam. Skin: No rash. Port-A-Cath without erythema.   Lab Results:  Lab Results  Component Value Date   WBC 6.8 02/04/2020   HGB 11.6 (L) 02/04/2020   HCT 35.5 (L) 02/04/2020   MCV 95.2 02/04/2020   PLT 250 02/04/2020   NEUTROABS 3.5 02/04/2020    Imaging:  No results found.  Medications: I have reviewed the patient's current medications.  Assessment/Plan: 1. 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  -12/02/19 MRI of the abdomen - showed a 18 x17 mm mass in the head of the pancreas with pancreatic duct and CBD dilatation. There are small cysts  noted in the right liver lobe, otherwise no evidence of hepatic mets or other metastatic disease in the abdomen. -CA 19-9 1349  -12/11/19 EUS/FNA per Dr. Ardis Hughs - irregular hypoechoic 3.4 cm mass in the pancreatic head with a 1.5 cm segment of invasion into the portal vein. There was no peripancreatic adenopathy.  -Pathology showed malignant cells consistent with adenocarcinoma -12/11/19 ERCP with placement of metallic biliary stent of malignant stricture and obstruction of the distal CBD.  -CT chest 12/24/2019, multiple nonspecific lung nodules, indeterminate low-attenuation lesion in segment 7 of the liver, enlarged porta hepatis node, hypodense nodule at the isthmus of the thyroid gland -CT abdomen/pelvis 12/29/2019, 3.1 cm pancreas body mass extending along 200 degrees of the anterior distal portal vein with narrowing of the portal vein, mass involves the right gastric artery, mildly enlarged porta hepatis node, segment 8 liver lesion-nonspecific -CA 19-9 on 12/29/19 2298 -Cycle 1 gemcitabine/abraxane on 01/07/20 -Cycle 2 gemcitabine/Abraxane 01/21/2020             -Cycle 3 gemcitabine/Abraxane 02/04/2020  -Cycle 4 gemcitabine/Abraxane 02/19/2020  2. Abdominal discomfort, nausea, constipation, secondary to #1  -s/p ERCP and stenting of malignant stricture of distal CBD  -We reviewed bowel regimen and pain control. Given tramadol PRN on 12/15/19   3. Transaminitis and hyperbilirubinemia, secondary to #1 12/04/19 Bili 3.3, ALT 538 AST 527 and ALK PHOS 414. -12/11/19 Tbili was elevated to 6.4  -juandice resolved after ERCP and stenting   4. Anorexia and weight loss -presented with 15 lbs weight loss, secondary to #1 -she  has been referred to dietician   5. Genetics  -Her family history  is not strongly suggestive of an inheritable cancer syndrome.  -Due to her personal history of pancreas cancer, she qualifies for genetics, r/o BRCA mutation.  -If positive, she would be a candidate for PARP inhibitor in the future -collected 12/29/19,negative for BRCA mutation  6. Vaccinations, s/p both doses of COVID19 vaccine (moderna)    Disposition: Mary [name] appears stable.  She has completed 3 cycles of gemcitabine/Abraxane.  Plan to proceed with cycle 4 today as scheduled.  We will follow-up on the CA 19-9 from today.  She will be referred for restaging CT scans after completing 5 or 6 cycles of chemotherapy.  We reviewed the CBC from today.  Counts adequate to proceed with treatment.  The low-grade fever a few days after treatment is most likely related to gemcitabine.  She will return for lab, follow-up, cycle 5 gemcitabine/Abraxane in 2 weeks.  She will contact the office in the interim with any problems.    [name] ANP/GNP-BC   02/19/2020  12:18 PM  Addendum 1:28 PM-chemistry panel results show transaminase elevation.  Total bilirubin is within normal range. I reviewed results with the [place] pharmacist and Dr. Burr Medico.  Plan to proceed with treatment today as scheduled.  I discussed with Ms. Life.  She agrees to proceed.

## 2020-02-19 NOTE — Progress Notes (Signed)
Per [name], ok to treat with elevated LFTs today. Will adjust dose per pharmacy.

## 2020-02-19 NOTE — Patient Instructions (Signed)
[facility] Discharge Instructions for Patients Receiving Chemotherapy  Today you received the following chemotherapy agents: abraxane and gemcitabine.  To help prevent nausea and vomiting after your treatment, we encourage you to take your nausea medication as directed.   If you develop nausea and vomiting that is not controlled by your nausea medication, call the clinic.   BELOW ARE SYMPTOMS THAT SHOULD BE REPORTED IMMEDIATELY:  *FEVER GREATER THAN 100.5 F  *CHILLS WITH OR WITHOUT FEVER  NAUSEA AND VOMITING THAT IS NOT CONTROLLED WITH YOUR NAUSEA MEDICATION  *UNUSUAL SHORTNESS OF BREATH  *UNUSUAL BRUISING OR BLEEDING  TENDERNESS IN MOUTH AND THROAT WITH OR WITHOUT PRESENCE OF ULCERS  *URINARY PROBLEMS  *BOWEL PROBLEMS  UNUSUAL RASH Items with * indicate a potential emergency and should be followed up as soon as possible.  Feel free to call the clinic should you have any questions or concerns. The clinic phone number is [phone].  Please show the [place] at check-in to the Emergency Department and triage nurse.

## 2020-02-20 LAB — CANCER ANTIGEN 19-9: CA 19-9: 372 U/mL — ABNORMAL HIGH (ref 0–35)

## 2020-02-28 ENCOUNTER — Other Ambulatory Visit: Payer: Self-pay | Admitting: Oncology

## 2020-03-03 ENCOUNTER — Inpatient Hospital Stay (HOSPITAL_BASED_OUTPATIENT_CLINIC_OR_DEPARTMENT_OTHER): Payer: Medicare Other | Admitting: Nurse Practitioner

## 2020-03-03 ENCOUNTER — Inpatient Hospital Stay: Payer: Medicare Other | Admitting: Nutrition

## 2020-03-03 ENCOUNTER — Encounter: Payer: Self-pay | Admitting: *Deleted

## 2020-03-03 ENCOUNTER — Inpatient Hospital Stay: Payer: Medicare Other

## 2020-03-03 ENCOUNTER — Telehealth: Payer: Self-pay

## 2020-03-03 ENCOUNTER — Encounter: Payer: Self-pay | Admitting: Nurse Practitioner

## 2020-03-03 ENCOUNTER — Other Ambulatory Visit: Payer: Self-pay

## 2020-03-03 VITALS — BP 140/65 | HR 89 | Temp 97.7°F | Resp 15 | Ht 61.0 in | Wt 126.3 lb

## 2020-03-03 DIAGNOSIS — C250 Malignant neoplasm of head of pancreas: Secondary | ICD-10-CM

## 2020-03-03 DIAGNOSIS — Z95828 Presence of other vascular implants and grafts: Secondary | ICD-10-CM

## 2020-03-03 DIAGNOSIS — Z5111 Encounter for antineoplastic chemotherapy: Secondary | ICD-10-CM | POA: Diagnosis not present

## 2020-03-03 LAB — CMP (CANCER CENTER ONLY)
ALT: 69 U/L — ABNORMAL HIGH (ref 0–44)
AST: 58 U/L — ABNORMAL HIGH (ref 15–41)
Albumin: 3.5 g/dL (ref 3.5–5.0)
Alkaline Phosphatase: 189 U/L — ABNORMAL HIGH (ref 38–126)
Anion gap: 11 (ref 5–15)
BUN: 14 mg/dL (ref 8–23)
CO2: 25 mmol/L (ref 22–32)
Calcium: 9.3 mg/dL (ref 8.9–10.3)
Chloride: 104 mmol/L (ref 98–111)
Creatinine: 1.01 mg/dL — ABNORMAL HIGH (ref 0.44–1.00)
GFR, Est AFR Am: >60 mL/min (ref >60)
GFR, Estimated: 57 mL/min — ABNORMAL LOW (ref >60)
Glucose, Bld: 136 mg/dL — ABNORMAL HIGH (ref 70–99)
Potassium: 4.2 mmol/L (ref 3.5–5.1)
Sodium: 140 mmol/L (ref 135–145)
Total Bilirubin: 0.5 mg/dL (ref 0.3–1.2)
Total Protein: 6.9 g/dL (ref 6.5–8.1)

## 2020-03-03 LAB — CBC WITH DIFFERENTIAL (CANCER CENTER ONLY)
Abs Immature Granulocytes: 0.04 10*3/uL (ref 0.00–0.07)
Basophils Absolute: 0.1 10*3/uL (ref 0.0–0.1)
Basophils Relative: 1 %
Eosinophils Absolute: 0.3 10*3/uL (ref 0.0–0.5)
Eosinophils Relative: 5 %
HCT: 33.7 % — ABNORMAL LOW (ref 36.0–46.0)
Hemoglobin: 11.1 g/dL — ABNORMAL LOW (ref 12.0–15.0)
Immature Granulocytes: 1 %
Lymphocytes Relative: 30 %
Lymphs Abs: 1.9 10*3/uL (ref 0.7–4.0)
MCH: 32.5 pg (ref 26.0–34.0)
MCHC: 32.9 g/dL (ref 30.0–36.0)
MCV: 98.5 fL (ref 80.0–100.0)
Monocytes Absolute: 0.7 10*3/uL (ref 0.1–1.0)
Monocytes Relative: 11 %
Neutro Abs: 3.3 10*3/uL (ref 1.7–7.7)
Neutrophils Relative %: 52 %
Platelet Count: 205 10*3/uL (ref 150–400)
RBC: 3.42 MIL/uL — ABNORMAL LOW (ref 3.87–5.11)
RDW: 16 % — ABNORMAL HIGH (ref 11.5–15.5)
WBC Count: 6.3 10*3/uL (ref 4.0–10.5)
nRBC: 0 % (ref 0.0–0.2)

## 2020-03-03 MED ORDER — SODIUM CHLORIDE 0.9% FLUSH
10.0000 mL | INTRAVENOUS | Status: DC | PRN
  Administered 2020-03-03: 10 mL
  Filled 2020-03-03: qty 10

## 2020-03-03 MED ORDER — PACLITAXEL PROTEIN-BOUND CHEMO INJECTION 100 MG
100.0000 mg/m2 | Freq: Once | INTRAVENOUS | Status: AC
  Administered 2020-03-03: 150 mg via INTRAVENOUS
  Filled 2020-03-03: qty 30

## 2020-03-03 MED ORDER — SODIUM CHLORIDE 0.9 % IV SOLN
Freq: Once | INTRAVENOUS | Status: AC
  Filled 2020-03-03: qty 250

## 2020-03-03 MED ORDER — HEPARIN SOD (PORK) LOCK FLUSH 100 UNIT/ML IV SOLN
500.0000 [IU] | Freq: Once | INTRAVENOUS | Status: AC | PRN
  Administered 2020-03-03: 500 [IU]
  Filled 2020-03-03: qty 5

## 2020-03-03 MED ORDER — SODIUM CHLORIDE 0.9 % IV SOLN
1000.0000 mg/m2 | Freq: Once | INTRAVENOUS | Status: AC
  Administered 2020-03-03: 1596 mg via INTRAVENOUS
  Filled 2020-03-03: qty 41.98

## 2020-03-03 MED ORDER — PROCHLORPERAZINE MALEATE 10 MG PO TABS
10.0000 mg | ORAL_TABLET | Freq: Once | ORAL | Status: AC
  Administered 2020-03-03: 10 mg via ORAL

## 2020-03-03 MED ORDER — PROCHLORPERAZINE MALEATE 10 MG PO TABS
ORAL_TABLET | ORAL | Status: AC
  Filled 2020-03-03: qty 1

## 2020-03-03 NOTE — Progress Notes (Signed)
Nutrition follow-up completed with patient during infusion for pancreas cancer. Patient reports her appetite is improved and her taste has also increased. She continues to drink 1 boost oral nutrition supplement daily. Weight is stable and was documented as 126.3 pounds on June 17.  Patient weighed 125 pounds in March 2021.  Nutrition diagnosis: Inadequate oral intake has resolved.  I educated patient on the importance of continuing boost oral nutrition supplements daily.  I provided coupons. Provided education on improving taste alterations and provided fact sheets for patient to refer to when she gets home. Questions were answered.  Teach back method used.  Contact information has been given.  I will follow patient as needed.  Please refer to RD if nutrition needs identified.  **Disclaimer: This note was dictated with voice recognition software. Similar sounding words can inadvertently be transcribed and this note may contain transcription errors which may not have been corrected upon publication of note.**

## 2020-03-03 NOTE — Progress Notes (Addendum)
[facility] OFFICE PROGRESS NOTE   Diagnosis: Pancreas cancer  INTERVAL HISTORY:   Mary [name] returns as scheduled.  She completed cycle 4 gemcitabine/Abraxane 02/19/2020.  She had a single episode of nausea.  No mouth sores.  No diarrhea.  She had a low-grade fever for several days following the most recent treatment.  Over the past week she has become more active and her appetite has improved.  No rash.  Abdominal pain continues to be improved.  She has mild intermittent numbness in the feet.  Objective:  Vital signs in last 24 hours:  Blood pressure 140/65, pulse 89, temperature 97.7 F (36.5 C), temperature source Temporal, resp. rate 15, height _0  (1.549 m), weight 126 lb 4.8 oz (57.3 kg), SpO2 98 %.    HEENT: No thrush or ulcers. Resp: Lungs clear bilaterally. Cardio: Regular rate and rhythm. GI: Abdomen soft and nontender.  No hepatomegaly. Vascular: No leg edema. Neuro: Vibratory sense mildly decreased over the fingertips per tuning fork exam. Skin: No rash. Port-A-Cath without erythema.   Lab Results:  Lab Results  Component Value Date   WBC 6.3 03/03/2020   HGB 11.1 (L) 03/03/2020   HCT 33.7 (L) 03/03/2020   MCV 98.5 03/03/2020   PLT 205 03/03/2020   NEUTROABS 3.3 03/03/2020    Imaging:  No results found.  Medications: I have reviewed the patient's current medications.  Assessment/Plan: 1. 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  -12/02/19 MRI of the abdomen - showed a 18 x17 mm mass in the head of the pancreas with pancreatic duct and CBD dilatation. There are small cysts noted in the right liver lobe, otherwise no evidence of hepatic mets or other metastatic disease in the abdomen. -CA 19-9 1349    -12/11/19 EUS/FNA per Dr. Ardis Hughs - irregular hypoechoic 3.4 cm mass in the pancreatic head with a 1.5 cm segment of invasion into the portal vein. There was no peripancreatic adenopathy.  -Pathology showed malignant cells consistent with adenocarcinoma -12/11/19 ERCP with placement of metallic biliary stent of malignant stricture and obstruction of the distal CBD.  -CT chest 12/24/2019, multiple nonspecific lung nodules, indeterminate low-attenuation lesion in segment 7 of the liver, enlarged porta hepatis node, hypodense nodule at the isthmus of the thyroid gland -CT abdomen/pelvis 12/29/2019, 3.1 cm pancreas body mass extending along 200 degrees of the anterior distal portal vein with narrowing of the portal vein, mass involves the right gastric artery, mildly enlarged porta hepatis node, segment 8 liver lesion-nonspecific -CA 19-9 on 12/29/19 2298 -Cycle 1 gemcitabine/abraxane on 01/07/20 -Cycle 2 gemcitabine/Abraxane 01/21/2020 -Cycle 3 gemcitabine/Abraxane 02/04/2020             -Cycle 4 gemcitabine/Abraxane 02/19/2020  -Cycle 5 gemcitabine/Abraxane 03/03/2020  2. Abdominal discomfort, nausea, constipation, secondary to #1  -s/p ERCP and stenting of malignant stricture of distal CBD  -We reviewed bowel regimen and pain control. Given tramadol PRN on 12/15/19   3. Transaminitis and hyperbilirubinemia, secondary to #1 12/04/19 Bili 3.3, ALT 538 AST 527 and ALK PHOS 414. -12/11/19 Tbili was elevated to 6.4  -juandice resolved after ERCP and stenting   4. Anorexia and weight loss -presented with 15 lbs weight loss, secondary to #1 -she has been referred to dietician   5. Genetics  -Her family history is not strongly suggestive of an inheritable cancer  syndrome.  -Due to her  personal history of pancreas cancer, she qualifies for genetics, r/o BRCA mutation.  -If positive, she would be a candidate for PARP inhibitor in the future -collected 12/29/19,negative for BRCA mutation  6. Vaccinations, s/p both doses of COVID19 vaccine (moderna)   Disposition: Mary [name] appears stable.  She has completed 4 cycles of gemcitabine/Abraxane.  Most recent CA 19-9 was significantly improved.  Plan to proceed with cycle 5 today as scheduled pending chemistry panel results.  The overall plan is to complete 6 cycles prior to restaging CTs.  We reviewed the CBC from today.  Counts adequate to proceed with treatment.  Transaminases were elevated 2 weeks ago, total bilirubin was within normal range.  We will follow-up on the chemistry panel from today prior to proceeding.  She will return for lab, follow-up, cycle 6 gemcitabine/Abraxane in 2 weeks.  She will contact the office in the interim as outlined above or with any other problems.  Patient seen with Dr. Benay Spice.  Addendum 10:56 AM-chemistry panel available.  Transaminases with stable elevation.  Total bilirubin is normal.  Proceed with chemotherapy today as planned.  [name] ANP/GNP-BC   03/03/2020  10:24 AM  This was a shared visit with [name].  Mary [name] is tolerating the chemotherapy well.  Her performance status has improved and the CA 19-9 is lower.  The plan is to continue gemcitabine/Abraxane.  Julieanne Manson, MD

## 2020-03-03 NOTE — Telephone Encounter (Signed)
Per [name] NP let patient know to stop taking lipitor medication. Patient verbalized understanding.

## 2020-03-03 NOTE — Patient Instructions (Signed)
[facility] Discharge Instructions for Patients Receiving Chemotherapy  Today you received the following chemotherapy agents:  Abraxane, & Gemcitabine  To help prevent nausea and vomiting after your treatment, we encourage you to take your nausea medication as needed.   If you develop nausea and vomiting that is not controlled by your nausea medication, call the clinic.   BELOW ARE SYMPTOMS THAT SHOULD BE REPORTED IMMEDIATELY:  *FEVER GREATER THAN 100.5 F  *CHILLS WITH OR WITHOUT FEVER  NAUSEA AND VOMITING THAT IS NOT CONTROLLED WITH YOUR NAUSEA MEDICATION  *UNUSUAL SHORTNESS OF BREATH  *UNUSUAL BRUISING OR BLEEDING  TENDERNESS IN MOUTH AND THROAT WITH OR WITHOUT PRESENCE OF ULCERS  *URINARY PROBLEMS  *BOWEL PROBLEMS  UNUSUAL RASH Items with * indicate a potential emergency and should be followed up as soon as possible.  Feel free to call the clinic should you have any questions or concerns. The clinic phone number is [phone].  Please show the [place] at check-in to the Emergency Department and triage nurse.

## 2020-03-08 ENCOUNTER — Encounter: Payer: Self-pay | Admitting: Nurse Practitioner

## 2020-03-13 ENCOUNTER — Other Ambulatory Visit: Payer: Self-pay | Admitting: Oncology

## 2020-03-14 ENCOUNTER — Telehealth: Payer: Self-pay | Admitting: *Deleted

## 2020-03-14 NOTE — Telephone Encounter (Signed)
Called to f/u on pharmacy request for refill on mirtazapine. She reports pharmacy filled #30 per insurance and made remaining pills as #2 refills. She is only taking one/night. Will decline refill.

## 2020-03-17 ENCOUNTER — Ambulatory Visit: Payer: Medicare Other

## 2020-03-17 ENCOUNTER — Other Ambulatory Visit: Payer: Medicare Other

## 2020-03-18 ENCOUNTER — Inpatient Hospital Stay: Payer: Medicare Other | Attending: Nurse Practitioner

## 2020-03-18 ENCOUNTER — Inpatient Hospital Stay: Payer: Medicare Other

## 2020-03-18 ENCOUNTER — Encounter: Payer: Self-pay | Admitting: Nurse Practitioner

## 2020-03-18 ENCOUNTER — Other Ambulatory Visit: Payer: Self-pay

## 2020-03-18 ENCOUNTER — Inpatient Hospital Stay (HOSPITAL_BASED_OUTPATIENT_CLINIC_OR_DEPARTMENT_OTHER): Payer: Medicare Other | Admitting: Nurse Practitioner

## 2020-03-18 VITALS — BP 137/65 | HR 94 | Temp 97.5°F | Resp 18 | Ht 61.0 in | Wt 127.2 lb

## 2020-03-18 DIAGNOSIS — C250 Malignant neoplasm of head of pancreas: Secondary | ICD-10-CM

## 2020-03-18 DIAGNOSIS — Z5111 Encounter for antineoplastic chemotherapy: Secondary | ICD-10-CM | POA: Insufficient documentation

## 2020-03-18 DIAGNOSIS — Z95828 Presence of other vascular implants and grafts: Secondary | ICD-10-CM

## 2020-03-18 LAB — CMP (CANCER CENTER ONLY)
ALT: 109 U/L — ABNORMAL HIGH (ref 0–44)
AST: 124 U/L — ABNORMAL HIGH (ref 15–41)
Albumin: 3.6 g/dL (ref 3.5–5.0)
Alkaline Phosphatase: 233 U/L — ABNORMAL HIGH (ref 38–126)
Anion gap: 11 (ref 5–15)
BUN: 14 mg/dL (ref 8–23)
CO2: 23 mmol/L (ref 22–32)
Calcium: 9.5 mg/dL (ref 8.9–10.3)
Chloride: 105 mmol/L (ref 98–111)
Creatinine: 0.97 mg/dL (ref 0.44–1.00)
GFR, Est AFR Am: >60 mL/min (ref >60)
GFR, Estimated: >60 mL/min (ref >60)
Glucose, Bld: 106 mg/dL — ABNORMAL HIGH (ref 70–99)
Potassium: 4.6 mmol/L (ref 3.5–5.1)
Sodium: 139 mmol/L (ref 135–145)
Total Bilirubin: 0.5 mg/dL (ref 0.3–1.2)
Total Protein: 7.4 g/dL (ref 6.5–8.1)

## 2020-03-18 LAB — CBC WITH DIFFERENTIAL (CANCER CENTER ONLY)
Abs Immature Granulocytes: 0.03 10*3/uL (ref 0.00–0.07)
Basophils Absolute: 0.1 10*3/uL (ref 0.0–0.1)
Basophils Relative: 1 %
Eosinophils Absolute: 0.2 10*3/uL (ref 0.0–0.5)
Eosinophils Relative: 3 %
HCT: 35.5 % — ABNORMAL LOW (ref 36.0–46.0)
Hemoglobin: 11.5 g/dL — ABNORMAL LOW (ref 12.0–15.0)
Immature Granulocytes: 1 %
Lymphocytes Relative: 31 %
Lymphs Abs: 1.8 10*3/uL (ref 0.7–4.0)
MCH: 32.4 pg (ref 26.0–34.0)
MCHC: 32.4 g/dL (ref 30.0–36.0)
MCV: 100 fL (ref 80.0–100.0)
Monocytes Absolute: 0.6 10*3/uL (ref 0.1–1.0)
Monocytes Relative: 10 %
Neutro Abs: 3.2 10*3/uL (ref 1.7–7.7)
Neutrophils Relative %: 54 %
Platelet Count: 247 10*3/uL (ref 150–400)
RBC: 3.55 MIL/uL — ABNORMAL LOW (ref 3.87–5.11)
RDW: 15.3 % (ref 11.5–15.5)
WBC Count: 5.9 10*3/uL (ref 4.0–10.5)
nRBC: 0 % (ref 0.0–0.2)

## 2020-03-18 MED ORDER — PACLITAXEL PROTEIN-BOUND CHEMO INJECTION 100 MG
100.0000 mg/m2 | Freq: Once | INTRAVENOUS | Status: AC
  Administered 2020-03-18: 150 mg via INTRAVENOUS
  Filled 2020-03-18: qty 30

## 2020-03-18 MED ORDER — SODIUM CHLORIDE 0.9% FLUSH
10.0000 mL | INTRAVENOUS | Status: DC | PRN
  Administered 2020-03-18: 10 mL
  Filled 2020-03-18: qty 10

## 2020-03-18 MED ORDER — PROCHLORPERAZINE MALEATE 10 MG PO TABS
10.0000 mg | ORAL_TABLET | Freq: Once | ORAL | Status: AC
  Administered 2020-03-18: 10 mg via ORAL

## 2020-03-18 MED ORDER — SODIUM CHLORIDE 0.9 % IV SOLN
1000.0000 mg/m2 | Freq: Once | INTRAVENOUS | Status: AC
  Administered 2020-03-18: 1596 mg via INTRAVENOUS
  Filled 2020-03-18: qty 41.98

## 2020-03-18 MED ORDER — HEPARIN SOD (PORK) LOCK FLUSH 100 UNIT/ML IV SOLN
500.0000 [IU] | Freq: Once | INTRAVENOUS | Status: AC | PRN
  Administered 2020-03-18: 500 [IU]
  Filled 2020-03-18: qty 5

## 2020-03-18 MED ORDER — SODIUM CHLORIDE 0.9 % IV SOLN
Freq: Once | INTRAVENOUS | Status: AC
  Filled 2020-03-18: qty 250

## 2020-03-18 MED ORDER — PROCHLORPERAZINE MALEATE 10 MG PO TABS
ORAL_TABLET | ORAL | Status: AC
  Filled 2020-03-18: qty 1

## 2020-03-18 NOTE — Patient Instructions (Signed)
[facility] Discharge Instructions for Patients Receiving Chemotherapy  Today you received the following chemotherapy agents Paclitaxel-protein (ABRAXANE) & Gemcitabine (GEMZAR).  To help prevent nausea and vomiting after your treatment, we encourage you to take your nausea medication as prescribed.   If you develop nausea and vomiting that is not controlled by your nausea medication, call the clinic.   BELOW ARE SYMPTOMS THAT SHOULD BE REPORTED IMMEDIATELY:  *FEVER GREATER THAN 100.5 F  *CHILLS WITH OR WITHOUT FEVER  NAUSEA AND VOMITING THAT IS NOT CONTROLLED WITH YOUR NAUSEA MEDICATION  *UNUSUAL SHORTNESS OF BREATH  *UNUSUAL BRUISING OR BLEEDING  TENDERNESS IN MOUTH AND THROAT WITH OR WITHOUT PRESENCE OF ULCERS  *URINARY PROBLEMS  *BOWEL PROBLEMS  UNUSUAL RASH Items with * indicate a potential emergency and should be followed up as soon as possible.  Feel free to call the clinic should you have any questions or concerns. The clinic phone number is [phone].  Please show the CHEMO ALERT CARD at check-in to the Emergency Department and triage nurse.   

## 2020-03-18 NOTE — Progress Notes (Signed)
[facility] OFFICE PROGRESS NOTE   Diagnosis: Pancreas cancer  INTERVAL HISTORY:   Mary [name] returns as scheduled.  She completed cycle 5 gemcitabine/Abraxane 03/03/2020.  She reports feeling fairly well over the past 2 weeks.  She denies nausea/vomiting.  No mouth sores.  No diarrhea.  No fever or rash following treatment.  Feet intermittently with an abnormal sensation.  She does not characterize this as numb or tingly.  Objective:  Vital signs in last 24 hours:  Blood pressure 137/65, pulse 94, temperature (!) 97.5 F (36.4 C), temperature source Temporal, resp. rate 18, height 5' 1"  (1.549 m), weight 127 lb 3.2 oz (57.7 kg), SpO2 98 %.    HEENT: No thrush or ulcers. Resp: Lungs clear bilaterally. Cardio: Regular rate and rhythm. GI: Abdomen soft and nontender.  No hepatomegaly.  No mass. Vascular: No leg edema. Neuro: Mild decrease in vibratory sense over the fingertips bilaterally per tuning fork exam. Skin: No rash. Port-A-Cath without erythema.   Lab Results:  Lab Results  Component Value Date   WBC 5.9 03/18/2020   HGB 11.5 (L) 03/18/2020   HCT 35.5 (L) 03/18/2020   MCV 100.0 03/18/2020   PLT 247 03/18/2020   NEUTROABS 3.2 03/18/2020    Imaging:  No results found.  Medications: I have reviewed the patient's current medications.  Assessment/Plan: 1. 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  -12/02/19 MRI of the abdomen - showed a 18 x17 mm mass in the head of the pancreas with pancreatic duct and CBD dilatation. There are small cysts noted in the right liver lobe, otherwise no evidence of hepatic mets or other metastatic disease in the abdomen. -CA 19-9 1349  -12/11/19 EUS/FNA per Dr. Ardis Hughs -  irregular hypoechoic 3.4 cm mass in the pancreatic head with a 1.5 cm segment of invasion into the portal vein. There was no peripancreatic adenopathy.  -Pathology showed malignant cells consistent with adenocarcinoma -12/11/19 ERCP with placement of metallic biliary stent of malignant stricture and obstruction of the distal CBD.  -CT chest 12/24/2019, multiple nonspecific lung nodules, indeterminate low-attenuation lesion in segment 7 of the liver, enlarged porta hepatis node, hypodense nodule at the isthmus of the thyroid gland -CT abdomen/pelvis 12/29/2019, 3.1 cm pancreas body mass extending along 200 degrees of the anterior distal portal vein with narrowing of the portal vein, mass involves the right gastric artery, mildly enlarged porta hepatis node, segment 8 liver lesion-nonspecific -CA 19-9 on 12/29/19 2298 -Cycle 1 gemcitabine/abraxane on 01/07/20 -Cycle 2 gemcitabine/Abraxane 01/21/2020 -Cycle 3 gemcitabine/Abraxane 02/04/2020 -Cycle 4 gemcitabine/Abraxane 02/19/2020             -Cycle 5 gemcitabine/Abraxane 03/03/2020  -Cycle 6 gemcitabine/Abraxane 03/18/2020  2. Abdominal discomfort, nausea, constipation, secondary to #1  -s/p ERCP and stenting of malignant stricture of distal CBD  -We reviewed bowel regimen and pain control. Given tramadol PRN on 12/15/19   3. Transaminitis and hyperbilirubinemia, secondary to #1 12/04/19 Bili 3.3, ALT 538 AST 527 and ALK PHOS 414. -12/11/19 Tbili was elevated to 6.4  -jaundice resolved after ERCP and stenting   4. Anorexia and weight loss -presented with 15 lbs weight loss, secondary to #1 -she has been referred to dietician   5. Genetics  -Her family history is not strongly suggestive of an inheritable cancer syndrome.  -Due to  her personal history  of pancreas cancer, she qualifies for genetics, r/o BRCA mutation.  -If positive, she would be a candidate for PARP inhibitor in the future -collected 12/29/19,negative for BRCA mutation  6. Vaccinations, s/p both doses of COVID19 vaccine (moderna)   Disposition: Mary [name] appears stable.  She has completed 5 cycles of gemcitabine/Abraxane.  She continues to tolerate the chemotherapy well.  Most recent CA 19-9 was improved.  Plan to proceed with cycle 6 today as scheduled.  She will undergo restaging CTs prior to her next visit.  We reviewed the CBC and chemistry panel from today.  Labs adequate to proceed with treatment.  Transaminases slightly more elevated.  Total bilirubin is normal.  Continue to monitor.  She will return for lab, follow-up, possible treatment in 2 weeks with restaging CTs a few days prior.  She will contact the office in the interim with any problems.  Plan reviewed with Dr. Benay Spice.    Mary [name] ANP/GNP-BC   03/18/2020  2:52 PM

## 2020-03-18 NOTE — Patient Instructions (Signed)

## 2020-03-19 LAB — CANCER ANTIGEN 19-9: CA 19-9: 183 U/mL — ABNORMAL HIGH (ref 0–35)

## 2020-03-22 ENCOUNTER — Telehealth: Payer: Self-pay | Admitting: Nurse Practitioner

## 2020-03-22 NOTE — Telephone Encounter (Signed)
Per 7/2 los, no changes made to pt schedule

## 2020-03-27 ENCOUNTER — Other Ambulatory Visit: Payer: Self-pay | Admitting: Oncology

## 2020-03-31 ENCOUNTER — Ambulatory Visit (HOSPITAL_COMMUNITY)
Admission: RE | Admit: 2020-03-31 | Discharge: 2020-03-31 | Disposition: A | Discharge status: Home / Self Care | Payer: Medicare Other | Source: Ambulatory Visit | Attending: Nurse Practitioner | Admitting: Nurse Practitioner

## 2020-03-31 ENCOUNTER — Encounter (HOSPITAL_COMMUNITY): Payer: Self-pay

## 2020-03-31 ENCOUNTER — Other Ambulatory Visit: Payer: Self-pay

## 2020-03-31 DIAGNOSIS — C250 Malignant neoplasm of head of pancreas: Secondary | ICD-10-CM | POA: Diagnosis not present

## 2020-03-31 MED ORDER — SODIUM CHLORIDE (PF) 0.9 % IJ SOLN
INTRAMUSCULAR | Status: AC
  Filled 2020-03-31: qty 50

## 2020-03-31 MED ORDER — IOHEXOL 300 MG/ML  SOLN
100.0000 mL | Freq: Once | INTRAMUSCULAR | Status: AC | PRN
  Administered 2020-03-31: 100 mL via INTRAVENOUS

## 2020-04-01 ENCOUNTER — Inpatient Hospital Stay: Payer: Medicare Other

## 2020-04-01 ENCOUNTER — Other Ambulatory Visit: Payer: Self-pay

## 2020-04-01 ENCOUNTER — Inpatient Hospital Stay (HOSPITAL_BASED_OUTPATIENT_CLINIC_OR_DEPARTMENT_OTHER): Payer: Medicare Other | Admitting: Oncology

## 2020-04-01 VITALS — BP 132/67 | HR 83 | Temp 97.9°F | Resp 18 | Ht 61.0 in | Wt 128.0 lb

## 2020-04-01 DIAGNOSIS — R200 Anesthesia of skin: Secondary | ICD-10-CM | POA: Diagnosis not present

## 2020-04-01 DIAGNOSIS — R630 Anorexia: Secondary | ICD-10-CM | POA: Diagnosis not present

## 2020-04-01 DIAGNOSIS — C250 Malignant neoplasm of head of pancreas: Secondary | ICD-10-CM

## 2020-04-01 DIAGNOSIS — R634 Abnormal weight loss: Secondary | ICD-10-CM

## 2020-04-01 DIAGNOSIS — Z95828 Presence of other vascular implants and grafts: Secondary | ICD-10-CM

## 2020-04-01 DIAGNOSIS — Z5111 Encounter for antineoplastic chemotherapy: Secondary | ICD-10-CM | POA: Diagnosis not present

## 2020-04-01 LAB — CBC WITH DIFFERENTIAL (CANCER CENTER ONLY)
Abs Immature Granulocytes: 0.02 10*3/uL (ref 0.00–0.07)
Basophils Absolute: 0.1 10*3/uL (ref 0.0–0.1)
Basophils Relative: 1 %
Eosinophils Absolute: 0.3 10*3/uL (ref 0.0–0.5)
Eosinophils Relative: 5 %
HCT: 32.8 % — ABNORMAL LOW (ref 36.0–46.0)
Hemoglobin: 10.6 g/dL — ABNORMAL LOW (ref 12.0–15.0)
Immature Granulocytes: 0 %
Lymphocytes Relative: 33 %
Lymphs Abs: 1.8 10*3/uL (ref 0.7–4.0)
MCH: 32.3 pg (ref 26.0–34.0)
MCHC: 32.3 g/dL (ref 30.0–36.0)
MCV: 100 fL (ref 80.0–100.0)
Monocytes Absolute: 0.6 10*3/uL (ref 0.1–1.0)
Monocytes Relative: 11 %
Neutro Abs: 2.7 10*3/uL (ref 1.7–7.7)
Neutrophils Relative %: 50 %
Platelet Count: 194 10*3/uL (ref 150–400)
RBC: 3.28 MIL/uL — ABNORMAL LOW (ref 3.87–5.11)
RDW: 14.7 % (ref 11.5–15.5)
WBC Count: 5.5 10*3/uL (ref 4.0–10.5)
nRBC: 0 % (ref 0.0–0.2)

## 2020-04-01 LAB — CMP (CANCER CENTER ONLY)
ALT: 68 U/L — ABNORMAL HIGH (ref 0–44)
AST: 64 U/L — ABNORMAL HIGH (ref 15–41)
Albumin: 3.3 g/dL — ABNORMAL LOW (ref 3.5–5.0)
Alkaline Phosphatase: 203 U/L — ABNORMAL HIGH (ref 38–126)
Anion gap: 10 (ref 5–15)
BUN: 10 mg/dL (ref 8–23)
CO2: 24 mmol/L (ref 22–32)
Calcium: 9.3 mg/dL (ref 8.9–10.3)
Chloride: 106 mmol/L (ref 98–111)
Creatinine: 1.09 mg/dL — ABNORMAL HIGH (ref 0.44–1.00)
GFR, Est AFR Am: >60 mL/min (ref >60)
GFR, Estimated: 52 mL/min — ABNORMAL LOW (ref >60)
Glucose, Bld: 143 mg/dL — ABNORMAL HIGH (ref 70–99)
Potassium: 4.2 mmol/L (ref 3.5–5.1)
Sodium: 140 mmol/L (ref 135–145)
Total Bilirubin: 0.4 mg/dL (ref 0.3–1.2)
Total Protein: 6.8 g/dL (ref 6.5–8.1)

## 2020-04-01 MED ORDER — SODIUM CHLORIDE 0.9% FLUSH
10.0000 mL | INTRAVENOUS | Status: DC | PRN
  Administered 2020-04-01 (×2): 10 mL
  Filled 2020-04-01: qty 10

## 2020-04-01 MED ORDER — PROCHLORPERAZINE MALEATE 10 MG PO TABS
ORAL_TABLET | ORAL | Status: AC
  Filled 2020-04-01: qty 1

## 2020-04-01 MED ORDER — HEPARIN SOD (PORK) LOCK FLUSH 100 UNIT/ML IV SOLN
500.0000 [IU] | Freq: Once | INTRAVENOUS | Status: AC | PRN
  Administered 2020-04-01: 500 [IU]
  Filled 2020-04-01: qty 5

## 2020-04-01 MED ORDER — PACLITAXEL PROTEIN-BOUND CHEMO INJECTION 100 MG
100.0000 mg/m2 | Freq: Once | INTRAVENOUS | Status: AC
  Administered 2020-04-01: 150 mg via INTRAVENOUS
  Filled 2020-04-01: qty 30

## 2020-04-01 MED ORDER — SODIUM CHLORIDE 0.9% FLUSH
10.0000 mL | INTRAVENOUS | Status: DC | PRN
  Administered 2020-04-01: 10 mL
  Filled 2020-04-01: qty 10

## 2020-04-01 MED ORDER — PROCHLORPERAZINE MALEATE 10 MG PO TABS
10.0000 mg | ORAL_TABLET | Freq: Once | ORAL | Status: AC
  Administered 2020-04-01: 10 mg via ORAL

## 2020-04-01 MED ORDER — SODIUM CHLORIDE 0.9 % IV SOLN
1000.0000 mg/m2 | Freq: Once | INTRAVENOUS | Status: AC
  Administered 2020-04-01: 1596 mg via INTRAVENOUS
  Filled 2020-04-01: qty 41.98

## 2020-04-01 MED ORDER — SODIUM CHLORIDE 0.9 % IV SOLN
Freq: Once | INTRAVENOUS | Status: AC
  Filled 2020-04-01: qty 250

## 2020-04-01 NOTE — Progress Notes (Signed)
[place] OFFICE PROGRESS NOTE   Diagnosis: Pancreas cancer  INTERVAL HISTORY:   Mary [name] completed another treatment with gemcitabine/Abraxane on 03/18/2020.  She reports mild nausea several days following chemotherapy.  She has mild numbness in the feet.  This was present prior to chemotherapy and has not worsened.  No hand numbness.  No abdominal pain.  Objective:  Vital signs in last 24 hours:  Blood pressure 132/67, pulse 83, temperature 97.9 F (36.6 C), temperature source Temporal, resp. rate 18, height 5' 1"  (1.549 m), weight 128 lb (58.1 kg), SpO2 98 %.    HEENT: No thrush Resp: Lungs clear bilaterally Cardio: Regular rate and rhythm GI: No hepatosplenomegaly, nontender, no mass Vascular: No leg edema Neuro: Sensation intact to light touch at the soles    Portacath/PICC-without erythema  Lab Results:  Lab Results  Component Value Date   WBC 5.5 04/01/2020   HGB 10.6 (L) 04/01/2020   HCT 32.8 (L) 04/01/2020   MCV 100.0 04/01/2020   PLT 194 04/01/2020   NEUTROABS 2.7 04/01/2020    CMP  Lab Results  Component Value Date   NA 140 04/01/2020   K 4.2 04/01/2020   CL 106 04/01/2020   CO2 24 04/01/2020   GLUCOSE 143 (H) 04/01/2020   BUN 10 04/01/2020   CREATININE 1.09 (H) 04/01/2020   CALCIUM 9.3 04/01/2020   PROT 6.8 04/01/2020   ALBUMIN 3.3 (L) 04/01/2020   AST 64 (H) 04/01/2020   ALT 68 (H) 04/01/2020   ALKPHOS 203 (H) 04/01/2020   BILITOT 0.4 04/01/2020   GFRNONAA 52 (L) 04/01/2020   GFRAA >60 04/01/2020     Imaging:  CT Abdomen Pelvis W Contrast  Result Date: 04/01/2020 CLINICAL DATA:  Pancreatic cancer. Status post chemotherapy, completing 03/03/2020. EXAM: CT ABDOMEN AND PELVIS WITH CONTRAST TECHNIQUE: Multidetector CT imaging of the abdomen and pelvis was performed using the standard protocol following bolus administration of intravenous contrast. CONTRAST:  136m OMNIPAQUE IOHEXOL 300 MG/ML  SOLN COMPARISON:  12/29/2019  FINDINGS: Lower chest: Mild cylindrical bronchiectasis again identified in both lung bases, likely post infectious or inflammatory. Mild cardiomegaly, without pericardial or pleural effusion. Incompletely imaged central line in the right atrium. Hepatobiliary: Segment 8 hypoattenuating liver lesion of 8 mm on 26/2 is similar to on the prior. Too small to characterize. No new liver lesion.  Pneumobilia.  Common duct stent in place. Normal gallbladder. Pancreas: Pancreatic body/tail atrophy and duct dilatation persist. The pancreatic head/neck lesion is significantly decreased. Somewhat difficult to delineate, but felt to measure on the order of 1.6 x 0.9 cm on 44/2. Compare 3.1 x 1.9 cm on the prior exam. No superimposed acute pancreatitis. Spleen: Normal in size, without focal abnormality. Adrenals/Urinary Tract: Normal adrenal glands. Interpolar right renal 1.0 cm cyst or minimally complex cyst. Normal left kidney. No hydronephrosis. Normal urinary bladder. Stomach/Bowel: Gastric antral underdistention. Tiny periampullary duodenal diverticulum. Otherwise normal small bowel. Normal terminal ileum and appendix. Normal colon. Vascular/Lymphatic: Advanced aortic and branch vessel atherosclerosis. Celiac and SMA are uninvolved with tumor. Persistent contact over less than 180 degree portion of the inferior portal vein and splenoportal confluence on images 41 through 43 of series 2. Portal caval node measures 11 mm on 42/2 and is similar. Gastrohepatic ligament node measures 7 mm on 37/2 versus 6 mm on the prior No pelvic sidewall adenopathy. Reproductive: Hysterectomy. No adnexal mass. Bilateral tubal ligation. Other: No significant free fluid. Moderate pelvic floor laxity. No evidence of omental or peritoneal disease. A  tiny periumbilical fat containing ventral abdominal wall hernia. Musculoskeletal: Mild osteopenia. L3-4 and L4-5 disc bulges. L4-5 degenerative disc disease. S-shaped thoracic spine curvature.  IMPRESSION: 1. Response to therapy of pancreatic head/neck primary. 2. Similar size of indeterminate upper abdominal borderline size lymph nodes. 3. Similar segment 8 liver lesion which is too small to characterize. 4.  Aortic Atherosclerosis (ICD10-I70.0). Electronically Signed   By: Abigail Miyamoto M.D.   On: 04/01/2020 07:26    Medications: I have reviewed the patient's current medications.   Assessment/Plan: 1. Malignant neoplasm in head of pancreas - adenocarcinoma  -Presented with abdominal discomfort, constipation, anorexia, weight loss and juandice  -11/30/19 CT AP wo contrast in [facility] health) - fullness in the pancreatic head.  -12/02/19 MRI of the abdomen - showed a 18 x17 mm mass in the head of the pancreas with pancreatic duct and CBD dilatation. There are small cysts noted in the right liver lobe, otherwise no evidence of hepatic mets or other metastatic disease in the abdomen. -CA 19-9 1349  -12/11/19 EUS/FNA per Dr. Ardis Hughs - irregular hypoechoic 3.4 cm mass in the pancreatic head with a 1.5 cm segment of invasion into the portal vein. There was no peripancreatic adenopathy.  -Pathology showed malignant cells consistent with adenocarcinoma -12/11/19 ERCP with placement of metallic biliary stent of malignant stricture and obstruction of the distal CBD.  -CT chest 12/24/2019, multiple nonspecific lung nodules, indeterminate low-attenuation lesion in segment 7 of the liver, enlarged porta hepatis node, hypodense nodule at the isthmus of the thyroid gland -CT abdomen/pelvis 12/29/2019, 3.1 cm pancreas body mass extending along 200 degrees of the anterior distal portal vein with narrowing of the portal vein, mass involves the right gastric artery, mildly enlarged porta hepatis node, segment 8 liver lesion-nonspecific -CA 19-9 on 12/29/19 2298 -Cycle 1  gemcitabine/abraxane on 01/07/20 -Cycle 2 gemcitabine/Abraxane 01/21/2020 -Cycle 3 gemcitabine/Abraxane 02/04/2020 -Cycle 4 gemcitabine/Abraxane 02/19/2020             -Cycle 5 gemcitabine/Abraxane 03/03/2020  -Cycle 6 gemcitabine/Abraxane 03/18/2020             -CT abdomen/pelvis 03/31/2020-decreased pancreas mass, stable indeterminate upper abdominal lymph nodes, stable indeterminate segment 8 liver lesion, persistent less than 180 degree tumor contact with the inferior portal vein and splenoportal confluence, celiac and SMA uninvolved by tumor             -Cycle 7 gemcitabine/Abraxane 04/01/2020  2. Abdominal discomfort, nausea, constipation, secondary to #1  -s/p ERCP and stenting of malignant stricture of distal CBD  -We reviewed bowel regimen and pain control. Given tramadol PRN on 12/15/19   3. Transaminitis and hyperbilirubinemia, secondary to #1 12/04/19 Bili 3.3, ALT 538 AST 527 and ALK PHOS 414. -12/11/19 Tbili was elevated to 6.4  -jaundice resolved after ERCP and stenting   4. Anorexia and weight loss -presented with 15 lbs weight loss, secondary to #1 -she has been referred to dietician   5. Genetics  -Her family history is not strongly suggestive of an inheritable cancer syndrome.  -Due to her personal history of pancreas cancer, she qualifies for genetics, r/o BRCA mutation.  -If positive, she would be a candidate for PARP inhibitor in the future -collected 12/29/19,negative for BRCA mutation  6. Vaccinations, s/p both doses of COVID19 vaccine (moderna)  7.  Foot numbness-Abraxane neuropathy?     Disposition: Mary [name] has completed 6 treatments with gemcitabine/Abraxane.  The CA 19-9 is lower, her pain is better, and the restaging CT shows a decrease in  the pancreas mass.  She will complete  another cycle of gemcitabine/Abraxane today.  Mary [name] will return for an office visit and chemotherapy in 2 weeks.  She is scheduled to see Dr. Barry Dienes next week for surgical planning.  Her case will be presented at the GI tumor conference on 04/06/2020.  I reviewed the CT images and discussed the treatment plan with Mary [name].  Betsy Coder, MD  04/01/2020  10:08 AM

## 2020-04-01 NOTE — Patient Instructions (Signed)
[facility] Discharge Instructions for Patients Receiving Chemotherapy  Today you received the following chemotherapy agents Paclitaxel-protein (ABRAXANE) & Gemcitabine (GEMZAR).  To help prevent nausea and vomiting after your treatment, we encourage you to take your nausea medication as prescribed.   If you develop nausea and vomiting that is not controlled by your nausea medication, call the clinic.   BELOW ARE SYMPTOMS THAT SHOULD BE REPORTED IMMEDIATELY:  *FEVER GREATER THAN 100.5 F  *CHILLS WITH OR WITHOUT FEVER  NAUSEA AND VOMITING THAT IS NOT CONTROLLED WITH YOUR NAUSEA MEDICATION  *UNUSUAL SHORTNESS OF BREATH  *UNUSUAL BRUISING OR BLEEDING  TENDERNESS IN MOUTH AND THROAT WITH OR WITHOUT PRESENCE OF ULCERS  *URINARY PROBLEMS  *BOWEL PROBLEMS  UNUSUAL RASH Items with * indicate a potential emergency and should be followed up as soon as possible.  Feel free to call the clinic should you have any questions or concerns. The clinic phone number is [phone].  Please show the CHEMO ALERT CARD at check-in to the Emergency Department and triage nurse.   

## 2020-04-02 LAB — CANCER ANTIGEN 19-9: CA 19-9: 141 U/mL — ABNORMAL HIGH (ref 0–35)

## 2020-04-04 ENCOUNTER — Telehealth: Payer: Self-pay | Admitting: Oncology

## 2020-04-04 ENCOUNTER — Other Ambulatory Visit: Payer: Self-pay | Admitting: General Surgery

## 2020-04-04 NOTE — Telephone Encounter (Signed)
Scheduled per 7/16 los. Pt is aware of appt time and date on 7/30.

## 2020-04-06 ENCOUNTER — Other Ambulatory Visit: Payer: Self-pay

## 2020-04-06 ENCOUNTER — Other Ambulatory Visit: Payer: Self-pay | Admitting: Oncology

## 2020-04-06 NOTE — Progress Notes (Signed)
remeron refilled.

## 2020-04-08 ENCOUNTER — Other Ambulatory Visit (HOSPITAL_COMMUNITY): Payer: Self-pay | Admitting: General Surgery

## 2020-04-08 ENCOUNTER — Other Ambulatory Visit: Payer: Self-pay | Admitting: General Surgery

## 2020-04-08 DIAGNOSIS — C250 Malignant neoplasm of head of pancreas: Secondary | ICD-10-CM

## 2020-04-10 ENCOUNTER — Other Ambulatory Visit: Payer: Self-pay | Admitting: Oncology

## 2020-04-15 ENCOUNTER — Encounter: Payer: Self-pay | Admitting: Nurse Practitioner

## 2020-04-15 ENCOUNTER — Inpatient Hospital Stay: Payer: Medicare Other

## 2020-04-15 ENCOUNTER — Inpatient Hospital Stay (HOSPITAL_BASED_OUTPATIENT_CLINIC_OR_DEPARTMENT_OTHER): Payer: Medicare Other | Admitting: Nurse Practitioner

## 2020-04-15 ENCOUNTER — Other Ambulatory Visit: Payer: Self-pay

## 2020-04-15 VITALS — BP 156/62 | HR 82 | Temp 98.1°F | Resp 20 | Ht 61.0 in | Wt 129.0 lb

## 2020-04-15 DIAGNOSIS — C250 Malignant neoplasm of head of pancreas: Secondary | ICD-10-CM

## 2020-04-15 DIAGNOSIS — Z95828 Presence of other vascular implants and grafts: Secondary | ICD-10-CM

## 2020-04-15 DIAGNOSIS — Z5111 Encounter for antineoplastic chemotherapy: Secondary | ICD-10-CM | POA: Diagnosis not present

## 2020-04-15 LAB — CBC WITH DIFFERENTIAL (CANCER CENTER ONLY)
Abs Immature Granulocytes: 0.03 10*3/uL (ref 0.00–0.07)
Basophils Absolute: 0 10*3/uL (ref 0.0–0.1)
Basophils Relative: 1 %
Eosinophils Absolute: 0.3 10*3/uL (ref 0.0–0.5)
Eosinophils Relative: 5 %
HCT: 32.7 % — ABNORMAL LOW (ref 36.0–46.0)
Hemoglobin: 10.8 g/dL — ABNORMAL LOW (ref 12.0–15.0)
Immature Granulocytes: 1 %
Lymphocytes Relative: 29 %
Lymphs Abs: 1.6 10*3/uL (ref 0.7–4.0)
MCH: 33.1 pg (ref 26.0–34.0)
MCHC: 33 g/dL (ref 30.0–36.0)
MCV: 100.3 fL — ABNORMAL HIGH (ref 80.0–100.0)
Monocytes Absolute: 0.7 10*3/uL (ref 0.1–1.0)
Monocytes Relative: 13 %
Neutro Abs: 2.9 10*3/uL (ref 1.7–7.7)
Neutrophils Relative %: 51 %
Platelet Count: 212 10*3/uL (ref 150–400)
RBC: 3.26 MIL/uL — ABNORMAL LOW (ref 3.87–5.11)
RDW: 14 % (ref 11.5–15.5)
WBC Count: 5.6 10*3/uL (ref 4.0–10.5)
nRBC: 0 % (ref 0.0–0.2)

## 2020-04-15 LAB — CMP (CANCER CENTER ONLY)
ALT: 39 U/L (ref 0–44)
AST: 54 U/L — ABNORMAL HIGH (ref 15–41)
Albumin: 3.4 g/dL — ABNORMAL LOW (ref 3.5–5.0)
Alkaline Phosphatase: 203 U/L — ABNORMAL HIGH (ref 38–126)
Anion gap: 11 (ref 5–15)
BUN: 13 mg/dL (ref 8–23)
CO2: 23 mmol/L (ref 22–32)
Calcium: 9.9 mg/dL (ref 8.9–10.3)
Chloride: 106 mmol/L (ref 98–111)
Creatinine: 1.14 mg/dL — ABNORMAL HIGH (ref 0.44–1.00)
GFR, Est AFR Am: 57 mL/min — ABNORMAL LOW (ref >60)
GFR, Estimated: 49 mL/min — ABNORMAL LOW (ref >60)
Glucose, Bld: 110 mg/dL — ABNORMAL HIGH (ref 70–99)
Potassium: 4.3 mmol/L (ref 3.5–5.1)
Sodium: 140 mmol/L (ref 135–145)
Total Bilirubin: 0.3 mg/dL (ref 0.3–1.2)
Total Protein: 6.7 g/dL (ref 6.5–8.1)

## 2020-04-15 MED ORDER — PACLITAXEL PROTEIN-BOUND CHEMO INJECTION 100 MG
100.0000 mg/m2 | Freq: Once | INTRAVENOUS | Status: AC
  Administered 2020-04-15: 150 mg via INTRAVENOUS
  Filled 2020-04-15: qty 30

## 2020-04-15 MED ORDER — HEPARIN SOD (PORK) LOCK FLUSH 100 UNIT/ML IV SOLN
500.0000 [IU] | Freq: Once | INTRAVENOUS | Status: AC | PRN
  Administered 2020-04-15: 500 [IU]
  Filled 2020-04-15: qty 5

## 2020-04-15 MED ORDER — SODIUM CHLORIDE 0.9% FLUSH
10.0000 mL | INTRAVENOUS | Status: DC | PRN
  Administered 2020-04-15: 10 mL
  Filled 2020-04-15: qty 10

## 2020-04-15 MED ORDER — SODIUM CHLORIDE 0.9 % IV SOLN
1000.0000 mg/m2 | Freq: Once | INTRAVENOUS | Status: AC
  Administered 2020-04-15: 1596 mg via INTRAVENOUS
  Filled 2020-04-15: qty 41.98

## 2020-04-15 MED ORDER — PROCHLORPERAZINE MALEATE 10 MG PO TABS
10.0000 mg | ORAL_TABLET | Freq: Once | ORAL | Status: AC
  Administered 2020-04-15: 10 mg via ORAL

## 2020-04-15 MED ORDER — LORAZEPAM 0.5 MG PO TABS: 0.5000 mg | ORAL_TABLET | Freq: Two times a day (BID) | ORAL | 0 refills | Status: DC | PRN

## 2020-04-15 MED ORDER — PROCHLORPERAZINE MALEATE 10 MG PO TABS
ORAL_TABLET | ORAL | Status: AC
  Filled 2020-04-15: qty 1

## 2020-04-15 MED ORDER — SODIUM CHLORIDE 0.9 % IV SOLN
Freq: Once | INTRAVENOUS | Status: AC
  Filled 2020-04-15: qty 250

## 2020-04-15 NOTE — Patient Instructions (Signed)
[place] Discharge Instructions for Patients Receiving Chemotherapy  Today you received the following chemotherapy agents: Paclitaxel -protein bound (Abraxane), Gemcitabine (Gemzar).  To help prevent nausea and vomiting after your treatment, we encourage you to take your nausea medication as directed by your MD.   If you develop nausea and vomiting that is not controlled by your nausea medication, call the clinic.   BELOW ARE SYMPTOMS THAT SHOULD BE REPORTED IMMEDIATELY:  *FEVER GREATER THAN 100.5 F  *CHILLS WITH OR WITHOUT FEVER  NAUSEA AND VOMITING THAT IS NOT CONTROLLED WITH YOUR NAUSEA MEDICATION  *UNUSUAL SHORTNESS OF BREATH  *UNUSUAL BRUISING OR BLEEDING  TENDERNESS IN MOUTH AND THROAT WITH OR WITHOUT PRESENCE OF ULCERS  *URINARY PROBLEMS  *BOWEL PROBLEMS  UNUSUAL RASH Items with * indicate a potential emergency and should be followed up as soon as possible.  Feel free to call the clinic should you have any questions or concerns. The clinic phone number is [phone].  Please show the [facility] at check-in to the Emergency Department and triage nurse.

## 2020-04-15 NOTE — Progress Notes (Signed)
[place] OFFICE PROGRESS NOTE   Diagnosis: Pancreas cancer  INTERVAL HISTORY:   Mary [name] returns as scheduled.  She completed cycle 7 gemcitabine/Abraxane 04/01/2020.  She denies nausea/vomiting.  No mouth sores.  No diarrhea.  Mild numbness in the feet.  She tends to have a low-grade fever for about 3 days after each treatment.  No rash.  Appetite is better.  She is having periodic anxiety and would like something to take when this occurs.  Objective:  Vital signs in last 24 hours:  Blood pressure (!) 156/62, pulse 82, temperature 98.1 F (36.7 C), resp. rate 20, height _0  (1.549 m), weight 129 lb (58.5 kg), SpO2 99 %.    HEENT: No thrush or ulcers. Resp: Lungs clear bilaterally. Cardio: Regular rate and rhythm. GI: Abdomen soft and nontender.  No hepatomegaly. Vascular: No leg edema. Neuro: Vibratory sense mildly decreased over the fingertips per tuning fork exam. Skin: No rash. Port-A-Cath without erythema.   Lab Results:  Lab Results  Component Value Date   WBC 5.6 04/15/2020   HGB 10.8 (L) 04/15/2020   HCT 32.7 (L) 04/15/2020   MCV 100.3 (H) 04/15/2020   PLT 212 04/15/2020   NEUTROABS 2.9 04/15/2020    Imaging:  No results found.  Medications: I have reviewed the patient's current medications.  Assessment/Plan: 1. Malignant neoplasm in head of pancreas - adenocarcinoma  -Presented with abdominal discomfort, constipation, anorexia, weight loss and juandice  -11/30/19 CT AP wo contrast in [facility] health) - fullness in the pancreatic head.  -12/02/19 MRI of the abdomen - showed a 18 x17 mm mass in the head of the pancreas with pancreatic duct and CBD dilatation. There are small cysts noted in the right liver lobe, otherwise no evidence of hepatic mets or other metastatic disease in the abdomen. -CA 19-9 1349  -12/11/19 EUS/FNA per Dr. Ardis Hughs - irregular hypoechoic 3.4 cm mass  in the pancreatic head with a 1.5 cm segment of invasion into the portal vein. There was no peripancreatic adenopathy.  -Pathology showed malignant cells consistent with adenocarcinoma -12/11/19 ERCP with placement of metallic biliary stent of malignant stricture and obstruction of the distal CBD.  -CT chest 12/24/2019, multiple nonspecific lung nodules, indeterminate low-attenuation lesion in segment 7 of the liver, enlarged porta hepatis node, hypodense nodule at the isthmus of the thyroid gland -CT abdomen/pelvis 12/29/2019, 3.1 cm pancreas body mass extending along 200 degrees of the anterior distal portal vein with narrowing of the portal vein, mass involves the right gastric artery, mildly enlarged porta hepatis node, segment 8 liver lesion-nonspecific -CA 19-9 on 12/29/19 2298 -Cycle 1 gemcitabine/abraxane on 01/07/20 -Cycle 2 gemcitabine/Abraxane 01/21/2020 -Cycle 3 gemcitabine/Abraxane 02/04/2020 -Cycle 4 gemcitabine/Abraxane 02/19/2020 -Cycle 5 gemcitabine/Abraxane 03/03/2020             -Cycle 6 gemcitabine/Abraxane 03/18/2020             -CT abdomen/pelvis 03/31/2020-decreased pancreas mass, stable indeterminate upper abdominal lymph nodes, stable indeterminate segment 8 liver lesion, persistent less than 180 degree tumor contact with the inferior portal vein and splenoportal confluence, celiac and SMA uninvolved by tumor             -Cycle 7 gemcitabine/Abraxane 04/01/2020   -Cycle 8 gemcitabine/Abraxane 04/15/2020  2. Abdominal discomfort, nausea, constipation, secondary to #1  -s/p ERCP and stenting of malignant stricture of distal CBD  -We reviewed bowel regimen and pain control. Given tramadol PRN on 12/15/19   3. Transaminitis and hyperbilirubinemia, secondary to #1  12/04/19 Bili 3.3, ALT 538 AST 527 and ALK PHOS  414. -12/11/19 Tbili was elevated to 6.4  -jaundice resolved after ERCP and stenting   4. Anorexia and weight loss -presented with 15 lbs weight loss, secondary to #1 -she has been referred to dietician   5. Genetics  -Her family history is not strongly suggestive of an inheritable cancer syndrome.  -Due to her personal history of pancreas cancer, she qualifies for genetics, r/o BRCA mutation.  -If positive, she would be a candidate for PARP inhibitor in the future -collected 12/29/19,negative for BRCA mutation  6. Vaccinations, s/p both doses of COVID19 vaccine (moderna)  7.  Foot numbness-Abraxane neuropathy?   Disposition: Mary [name] appears stable.  She has completed 7 cycles of gemcitabine/Abraxane.  Plan to proceed with cycle 8 today as scheduled.  Her case was presented at the GI tumor conference.  The plan is for treatment today, again in 2 weeks and then surgery.  We reviewed the CBC from today.  Counts adequate to proceed as above.  She is having some anxiety.  I am sending a prescription to her pharmacy for Ativan 0.5 mg twice daily as needed. She understands she should not drive while taking this and should not take pain medication at the same time.  She will return for lab, follow-up, gemcitabine/Abraxane in 2 weeks.    [name] ANP/GNP-BC   04/15/2020  9:13 AM

## 2020-04-16 LAB — CANCER ANTIGEN 19-9: CA 19-9: 143 U/mL — ABNORMAL HIGH (ref 0–35)

## 2020-04-19 ENCOUNTER — Telehealth: Payer: Self-pay | Admitting: Oncology

## 2020-04-19 ENCOUNTER — Other Ambulatory Visit: Payer: Self-pay

## 2020-04-19 ENCOUNTER — Encounter (HOSPITAL_COMMUNITY): Payer: Self-pay

## 2020-04-19 ENCOUNTER — Ambulatory Visit (HOSPITAL_COMMUNITY)
Admission: RE | Admit: 2020-04-19 | Discharge: 2020-04-19 | Disposition: A | Discharge status: Home / Self Care | Payer: Medicare Other | Source: Ambulatory Visit | Attending: General Surgery | Admitting: General Surgery

## 2020-04-19 DIAGNOSIS — C250 Malignant neoplasm of head of pancreas: Secondary | ICD-10-CM | POA: Insufficient documentation

## 2020-04-19 MED ORDER — HEPARIN SOD (PORK) LOCK FLUSH 100 UNIT/ML IV SOLN
500.0000 [IU] | Freq: Once | INTRAVENOUS | Status: AC
  Administered 2020-04-19: 500 [IU] via INTRAVENOUS

## 2020-04-19 MED ORDER — SODIUM CHLORIDE (PF) 0.9 % IJ SOLN
INTRAMUSCULAR | Status: AC
  Filled 2020-04-19: qty 50

## 2020-04-19 MED ORDER — IOHEXOL 300 MG/ML  SOLN
75.0000 mL | Freq: Once | INTRAMUSCULAR | Status: AC | PRN
  Administered 2020-04-19: 75 mL via INTRAVENOUS

## 2020-04-19 MED ORDER — HEPARIN SOD (PORK) LOCK FLUSH 100 UNIT/ML IV SOLN
INTRAVENOUS | Status: AC
  Filled 2020-04-19: qty 5

## 2020-04-19 NOTE — Telephone Encounter (Signed)
Scheduled per 7/30 los. Called and spoke with pt, pt will confirm appts on mychart

## 2020-04-22 ENCOUNTER — Other Ambulatory Visit: Payer: Self-pay | Admitting: Oncology

## 2020-04-29 ENCOUNTER — Inpatient Hospital Stay: Payer: Medicare Other

## 2020-04-29 ENCOUNTER — Other Ambulatory Visit: Payer: Self-pay

## 2020-04-29 ENCOUNTER — Inpatient Hospital Stay: Payer: Medicare Other | Attending: Nurse Practitioner

## 2020-04-29 ENCOUNTER — Telehealth: Payer: Self-pay | Admitting: Oncology

## 2020-04-29 ENCOUNTER — Inpatient Hospital Stay (HOSPITAL_BASED_OUTPATIENT_CLINIC_OR_DEPARTMENT_OTHER): Payer: Medicare Other | Admitting: Oncology

## 2020-04-29 VITALS — BP 150/75 | HR 82 | Temp 98.0°F | Resp 20 | Ht 61.0 in | Wt 128.9 lb

## 2020-04-29 DIAGNOSIS — C250 Malignant neoplasm of head of pancreas: Secondary | ICD-10-CM | POA: Diagnosis present

## 2020-04-29 DIAGNOSIS — Z5111 Encounter for antineoplastic chemotherapy: Secondary | ICD-10-CM | POA: Diagnosis present

## 2020-04-29 DIAGNOSIS — Z95828 Presence of other vascular implants and grafts: Secondary | ICD-10-CM

## 2020-04-29 LAB — CBC WITH DIFFERENTIAL (CANCER CENTER ONLY)
Abs Immature Granulocytes: 0.03 10*3/uL (ref 0.00–0.07)
Basophils Absolute: 0 10*3/uL (ref 0.0–0.1)
Basophils Relative: 1 %
Eosinophils Absolute: 0.4 10*3/uL (ref 0.0–0.5)
Eosinophils Relative: 8 %
HCT: 30.7 % — ABNORMAL LOW (ref 36.0–46.0)
Hemoglobin: 10.1 g/dL — ABNORMAL LOW (ref 12.0–15.0)
Immature Granulocytes: 1 %
Lymphocytes Relative: 28 %
Lymphs Abs: 1.4 10*3/uL (ref 0.7–4.0)
MCH: 33.1 pg (ref 26.0–34.0)
MCHC: 32.9 g/dL (ref 30.0–36.0)
MCV: 100.7 fL — ABNORMAL HIGH (ref 80.0–100.0)
Monocytes Absolute: 0.6 10*3/uL (ref 0.1–1.0)
Monocytes Relative: 12 %
Neutro Abs: 2.5 10*3/uL (ref 1.7–7.7)
Neutrophils Relative %: 50 %
Platelet Count: 184 10*3/uL (ref 150–400)
RBC: 3.05 MIL/uL — ABNORMAL LOW (ref 3.87–5.11)
RDW: 14.6 % (ref 11.5–15.5)
WBC Count: 4.9 10*3/uL (ref 4.0–10.5)
nRBC: 0 % (ref 0.0–0.2)

## 2020-04-29 LAB — CMP (CANCER CENTER ONLY)
ALT: 71 U/L — ABNORMAL HIGH (ref 0–44)
AST: 132 U/L — ABNORMAL HIGH (ref 15–41)
Albumin: 3.2 g/dL — ABNORMAL LOW (ref 3.5–5.0)
Alkaline Phosphatase: 315 U/L — ABNORMAL HIGH (ref 38–126)
Anion gap: 9 (ref 5–15)
BUN: 17 mg/dL (ref 8–23)
CO2: 23 mmol/L (ref 22–32)
Calcium: 9.6 mg/dL (ref 8.9–10.3)
Chloride: 105 mmol/L (ref 98–111)
Creatinine: 1.12 mg/dL — ABNORMAL HIGH (ref 0.44–1.00)
GFR, Est AFR Am: 58 mL/min — ABNORMAL LOW (ref >60)
GFR, Estimated: 50 mL/min — ABNORMAL LOW (ref >60)
Glucose, Bld: 155 mg/dL — ABNORMAL HIGH (ref 70–99)
Potassium: 4.3 mmol/L (ref 3.5–5.1)
Sodium: 137 mmol/L (ref 135–145)
Total Bilirubin: 0.5 mg/dL (ref 0.3–1.2)
Total Protein: 6.5 g/dL (ref 6.5–8.1)

## 2020-04-29 MED ORDER — SODIUM CHLORIDE 0.9 % IV SOLN
Freq: Once | INTRAVENOUS | Status: AC
  Filled 2020-04-29: qty 250

## 2020-04-29 MED ORDER — SODIUM CHLORIDE 0.9% FLUSH
10.0000 mL | INTRAVENOUS | Status: DC | PRN
  Administered 2020-04-29 (×2): 10 mL
  Filled 2020-04-29: qty 10

## 2020-04-29 MED ORDER — HEPARIN SOD (PORK) LOCK FLUSH 100 UNIT/ML IV SOLN
500.0000 [IU] | Freq: Once | INTRAVENOUS | Status: AC | PRN
  Administered 2020-04-29: 500 [IU]
  Filled 2020-04-29: qty 5

## 2020-04-29 MED ORDER — SODIUM CHLORIDE 0.9 % IV SOLN
1000.0000 mg/m2 | Freq: Once | INTRAVENOUS | Status: AC
  Administered 2020-04-29: 1596 mg via INTRAVENOUS
  Filled 2020-04-29: qty 41.98

## 2020-04-29 MED ORDER — PACLITAXEL PROTEIN-BOUND CHEMO INJECTION 100 MG
100.0000 mg/m2 | Freq: Once | INTRAVENOUS | Status: AC
  Administered 2020-04-29: 150 mg via INTRAVENOUS
  Filled 2020-04-29: qty 30

## 2020-04-29 MED ORDER — PROCHLORPERAZINE MALEATE 10 MG PO TABS
ORAL_TABLET | ORAL | Status: AC
  Filled 2020-04-29: qty 1

## 2020-04-29 MED ORDER — SODIUM CHLORIDE 0.9% FLUSH
10.0000 mL | INTRAVENOUS | Status: DC | PRN
  Administered 2020-04-29: 10 mL
  Filled 2020-04-29: qty 10

## 2020-04-29 MED ORDER — PROCHLORPERAZINE MALEATE 10 MG PO TABS
10.0000 mg | ORAL_TABLET | Freq: Once | ORAL | Status: AC
  Administered 2020-04-29: 10 mg via ORAL

## 2020-04-29 NOTE — Progress Notes (Signed)
Per Dr. Benay Spice, [place] to treat with AST 132.

## 2020-04-29 NOTE — Progress Notes (Signed)
[place] OFFICE PROGRESS NOTE   Diagnosis: Pancreas cancer  INTERVAL HISTORY:   Mary [name] completed another treatment with gemcitabine/Abraxane on 04/15/2020.  She reports malaise for several days following chemotherapy.  She had transient numbness in the fingers.  She also has intermittent numbness in the toes.  Her pain remains improved.  She is scheduled for pancreas surgery with Dr. Barry Dienes on 06/05/2020.  Objective:  Vital signs in last 24 hours:  Blood pressure (!) 150/75, pulse 82, temperature 98 F (36.7 C), temperature source Temporal, resp. rate 20, height 5' 1"  (1.549 m), weight 128 lb 14.4 oz (58.5 kg), SpO2 97 %.     Lymphatics: No cervical, supraclavicular, axillary, or inguinal nodes Resp: Lungs clear bilaterally Cardio: Regular rate and rhythm GI: Nontender, no mass, no hepatosplenomegaly Vascular: No leg edema   Portacath/PICC-without erythema  Lab Results:  Lab Results  Component Value Date   WBC 4.9 04/29/2020   HGB 10.1 (L) 04/29/2020   HCT 30.7 (L) 04/29/2020   MCV 100.7 (H) 04/29/2020   PLT 184 04/29/2020   NEUTROABS 2.5 04/29/2020    CMP  Lab Results  Component Value Date   NA 140 04/15/2020   K 4.3 04/15/2020   CL 106 04/15/2020   CO2 23 04/15/2020   GLUCOSE 110 (H) 04/15/2020   BUN 13 04/15/2020   CREATININE 1.14 (H) 04/15/2020   CALCIUM 9.9 04/15/2020   PROT 6.7 04/15/2020   ALBUMIN 3.4 (L) 04/15/2020   AST 54 (H) 04/15/2020   ALT 39 04/15/2020   ALKPHOS 203 (H) 04/15/2020   BILITOT 0.3 04/15/2020   GFRNONAA 49 (L) 04/15/2020   GFRAA 57 (L) 04/15/2020     Medications: I have reviewed the patient's current medications.   Assessment/Plan: 1.  Malignant neoplasm in head of pancreas - adenocarcinoma  -Presented with abdominal discomfort, constipation, anorexia, weight loss and juandice  -11/30/19 CT AP wo contrast in [facility] health) - fullness in the pancreatic head.    -12/02/19 MRI of the abdomen - showed a 18 x17 mm mass in the head of the pancreas with pancreatic duct and CBD dilatation. There are small cysts noted in the right liver lobe, otherwise no evidence of hepatic mets or other metastatic disease in the abdomen. -CA 19-9 1349  -12/11/19 EUS/FNA per Dr. Ardis Hughs - irregular hypoechoic 3.4 cm mass in the pancreatic head with a 1.5 cm segment of invasion into the portal vein. There was no peripancreatic adenopathy.  -Pathology showed malignant cells consistent with adenocarcinoma -12/11/19 ERCP with placement of metallic biliary stent of malignant stricture and obstruction of the distal CBD.  -CT chest 12/24/2019, multiple nonspecific lung nodules, indeterminate low-attenuation lesion in segment 7 of the liver, enlarged porta hepatis node, hypodense nodule at the isthmus of the thyroid gland -CT abdomen/pelvis 12/29/2019, 3.1 cm pancreas body mass extending along 200 degrees of the anterior distal portal vein with narrowing of the portal vein, mass involves the right gastric artery, mildly enlarged porta hepatis node, segment 8 liver lesion-nonspecific -CA 19-9 on 12/29/19 2298 -Cycle 1 gemcitabine/abraxane on 01/07/20 -Cycle 2 gemcitabine/Abraxane 01/21/2020 -Cycle 3 gemcitabine/Abraxane 02/04/2020 -Cycle 4 gemcitabine/Abraxane 02/19/2020 -Cycle 5 gemcitabine/Abraxane 03/03/2020             -Cycle 6 gemcitabine/Abraxane 03/18/2020             -CT abdomen/pelvis 03/31/2020-decreased pancreas mass, stable indeterminate upper abdominal lymph nodes, stable indeterminate segment 8 liver lesion, persistent less than 180 degree tumor contact with the inferior portal  vein and splenoportal confluence, celiac and SMA uninvolved by tumor             -Cycle 7 gemcitabine/Abraxane 04/01/2020   -Cycle 8 gemcitabine/Abraxane  04/15/2020             -CT chest 04/19/2020-unchanged prominent mediastinal lymph nodes, largest 9 mm, stable lung nodules, unchanged vague indeterminate segment 7 liver lesion             -Cycle 9 gemcitabine/Abraxane 04/29/2020  2. Abdominal discomfort, nausea, constipation, secondary to #1  -s/p ERCP and stenting of malignant stricture of distal CBD  -We reviewed bowel regimen and pain control. Given tramadol PRN on 12/15/19   3. Transaminitis and hyperbilirubinemia, secondary to #1 12/04/19 Bili 3.3, ALT 538 AST 527 and ALK PHOS 414. -12/11/19 Tbili was elevated to 6.4  -jaundice resolved after ERCP and stenting   4. Anorexia and weight loss -presented with 15 lbs weight loss, secondary to #1 -she has been referred to dietician   5. Genetics  -Her family history is not strongly suggestive of an inheritable cancer syndrome.  -Due to her personal history of pancreas cancer, she qualifies for genetics, r/o BRCA mutation.  -If positive, she would be a candidate for PARP inhibitor in the future -collected 12/29/19,negative for BRCA mutation  6. Vaccinations, s/p both doses of COVID19 vaccine (moderna)  7.  Foot numbness-Abraxane neuropathy?   Disposition: Mary [name] appears stable.  She will complete a final cycle of neoadjuvant gemcitabine/Abraxane today.  Her clinical status has improved with the neoadjuvant therapy.  She is scheduled for surgery 05/18/2020.  Mary [name] will return for an office visit during the week of 06/13/2020.  Betsy Coder, MD  04/29/2020  8:18 AM

## 2020-04-29 NOTE — Patient Instructions (Signed)

## 2020-04-29 NOTE — Telephone Encounter (Signed)
Scheduled appointment per 8/13 los. Spoke with patient's husband who is aware of appointment date and time.

## 2020-04-29 NOTE — Patient Instructions (Signed)
[facility] Discharge Instructions for Patients Receiving Chemotherapy  Today you received the following chemotherapy agents: Abraxane, Gemzar   To help prevent nausea and vomiting after your treatment, we encourage you to take your nausea medication as directed.    If you develop nausea and vomiting that is not controlled by your nausea medication, call the clinic.   BELOW ARE SYMPTOMS THAT SHOULD BE REPORTED IMMEDIATELY:  *FEVER GREATER THAN 100.5 F  *CHILLS WITH OR WITHOUT FEVER  NAUSEA AND VOMITING THAT IS NOT CONTROLLED WITH YOUR NAUSEA MEDICATION  *UNUSUAL SHORTNESS OF BREATH  *UNUSUAL BRUISING OR BLEEDING  TENDERNESS IN MOUTH AND THROAT WITH OR WITHOUT PRESENCE OF ULCERS  *URINARY PROBLEMS  *BOWEL PROBLEMS  UNUSUAL RASH Items with * indicate a potential emergency and should be followed up as soon as possible.  Feel free to call the clinic should you have any questions or concerns. The clinic phone number is [phone].  Please show the CHEMO ALERT CARD at check-in to the Emergency Department and triage nurse.   

## 2020-04-30 LAB — CANCER ANTIGEN 19-9: CA 19-9: 144 U/mL — ABNORMAL HIGH (ref 0–35)

## 2020-05-07 ENCOUNTER — Other Ambulatory Visit: Payer: Self-pay | Admitting: Nurse Practitioner

## 2020-05-07 DIAGNOSIS — C250 Malignant neoplasm of head of pancreas: Secondary | ICD-10-CM

## 2020-05-09 MED ORDER — LORAZEPAM 0.5 MG PO TABS: 0.5000 mg | ORAL_TABLET | Freq: Two times a day (BID) | ORAL | 0 refills | Status: AC | PRN

## 2020-05-11 ENCOUNTER — Other Ambulatory Visit: Payer: Self-pay

## 2020-05-13 NOTE — Progress Notes (Signed)
[id number] [address], [address] [place] [address] Phone: [phone] Fax: [phone]      Your procedure is scheduled on 05/23/2020  Report to [facility] Main Entrance "A" at 5:30A.M., and check in at the Admitting office.  Call this number if you have problems the morning of surgery:  [phone]  Call [phone] if you have any questions prior to your surgery date Monday-Friday 8am-4pm    Remember:  Do not eat after midnight the night before your surgery  You may drink clear liquids until 4:30 the morning of your surgery.   Clear liquids allowed are: Water, Non-Citrus Juices (without pulp), Carbonated Beverages, Clear Tea, Black Coffee Only, and Gatorade   Please complete your PRE-SURGERY ENSURE that was provided to you by 4:30 the morning of surgery.  Please, if able, drink it in one setting. DO NOT SIP.    Take these medicines the morning of surgery with A SIP OF WATER:  AS NEEDED:  acetaminophen (TYLENOL) albuterol (VENTOLIN HFA) dicyclomine (BENTYL)  HYDROcodone-acetaminophen (NORCO/VICODIN)  LORazepam (ATIVAN) ondansetron (ZOFRAN-ODT) oxymetazoline (VICKS SINEX) prochlorperazine (COMPAZINE)  Follow your surgeon's instructions on when to stop Aspirin.  If no instructions were given by your surgeon then you will need to call the office to get those instructions.    As of today, STOP taking any Aspirin (unless otherwise instructed by your surgeon) Aleve, Naproxen, Ibuprofen, Motrin, Advil, Goody's, BC's, all herbal medications, fish oil, and all vitamins.                      Do not wear jewelry, make up, or nail polish            Do not wear lotions, powders, perfumes or deodorant.            Do not shave 48 hours prior to surgery.              Do not bring valuables to the hospital.            [facility] is not responsible for any belongings or valuables.  Do NOT Smoke (Tobacco/Vaping) or  drink Alcohol 24 hours prior to your procedure If you use a CPAP at night, you may bring all equipment for your overnight stay.   Contacts, glasses, dentures or bridgework may not be worn into surgery.      For patients admitted to the hospital, discharge time will be determined by your treatment team.   Patients discharged the day of surgery will not be allowed to drive home, and someone needs to stay with them for 24 hours.    Special instructions:   [place]- Preparing For Surgery  Before surgery, you can play an important role. Because skin is not sterile, your skin needs to be as free of germs as possible. You can reduce the number of germs on your skin by washing with CHG (chlorahexidine gluconate) Soap before surgery.  CHG is an antiseptic cleaner which kills germs and bonds with the skin to continue killing germs even after washing.    Oral Hygiene is also important to reduce your risk of infection.  Remember - BRUSH YOUR TEETH THE MORNING OF SURGERY WITH YOUR REGULAR TOOTHPASTE  Please do not use if you have an allergy to CHG or antibacterial soaps. If your skin becomes reddened/irritated stop using the CHG.  Do not shave (including legs and underarms) for at least  48 hours prior to first CHG shower. It is OK to shave your face.  Please follow these instructions carefully.   1. Shower the NIGHT BEFORE SURGERY and the MORNING OF SURGERY with CHG Soap.   2. If you chose to wash your hair, wash your hair first as usual with your normal shampoo.  3. After you shampoo, rinse your hair and body thoroughly to remove the shampoo.  4. Use CHG as you would any other liquid soap. You can apply CHG directly to the skin and wash gently with a scrungie or a clean washcloth.   5. Apply the CHG Soap to your body ONLY FROM THE NECK DOWN.  Do not use on open wounds or open sores. Avoid contact with your eyes, ears, mouth and genitals (private parts). Wash Face and genitals (private parts)   with your normal soap.   6. Wash thoroughly, paying special attention to the area where your surgery will be performed.  7. Thoroughly rinse your body with warm water from the neck down.  8. DO NOT shower/wash with your normal soap after using and rinsing off the CHG Soap.  9. Pat yourself dry with a CLEAN TOWEL.  10. Wear CLEAN PAJAMAS to bed the night before surgery  11. Place CLEAN SHEETS on your bed the night of your first shower and DO NOT SLEEP WITH PETS.   Day of Surgery: Wear Clean/Comfortable clothing the morning of surgery Do not apply any deodorants/lotions.   Remember to brush your teeth WITH YOUR REGULAR TOOTHPASTE.   Please read over the following fact sheets that you were given.

## 2020-05-16 ENCOUNTER — Encounter (HOSPITAL_COMMUNITY): Payer: Self-pay

## 2020-05-16 ENCOUNTER — Other Ambulatory Visit (HOSPITAL_COMMUNITY)
Admission: RE | Admit: 2020-05-16 | Discharge: 2020-05-16 | Disposition: A | Discharge status: Home / Self Care | Payer: Medicare Other | Source: Ambulatory Visit | Attending: General Surgery | Admitting: General Surgery

## 2020-05-16 ENCOUNTER — Other Ambulatory Visit: Payer: Self-pay

## 2020-05-16 ENCOUNTER — Encounter (HOSPITAL_COMMUNITY)
Admission: RE | Admit: 2020-05-16 | Discharge: 2020-05-16 | Disposition: A | Discharge status: Home / Self Care | Payer: Medicare Other | Source: Ambulatory Visit | Attending: General Surgery | Admitting: General Surgery

## 2020-05-16 DIAGNOSIS — Z20822 Contact with and (suspected) exposure to covid-19: Secondary | ICD-10-CM | POA: Insufficient documentation

## 2020-05-16 DIAGNOSIS — Z01812 Encounter for preprocedural laboratory examination: Secondary | ICD-10-CM | POA: Insufficient documentation

## 2020-05-16 HISTORY — DX: Anxiety disorder, unspecified: F41.9

## 2020-05-16 LAB — CBC WITH DIFFERENTIAL/PLATELET
Abs Immature Granulocytes: 0.02 10*3/uL (ref 0.00–0.07)
Basophils Absolute: 0.1 10*3/uL (ref 0.0–0.1)
Basophils Relative: 1 %
Eosinophils Absolute: 0.2 10*3/uL (ref 0.0–0.5)
Eosinophils Relative: 3 %
HCT: 34.4 % — ABNORMAL LOW (ref 36.0–46.0)
Hemoglobin: 11 g/dL — ABNORMAL LOW (ref 12.0–15.0)
Immature Granulocytes: 0 %
Lymphocytes Relative: 30 %
Lymphs Abs: 1.6 10*3/uL (ref 0.7–4.0)
MCH: 32.8 pg (ref 26.0–34.0)
MCHC: 32 g/dL (ref 30.0–36.0)
MCV: 102.7 fL — ABNORMAL HIGH (ref 80.0–100.0)
Monocytes Absolute: 0.4 10*3/uL (ref 0.1–1.0)
Monocytes Relative: 7 %
Neutro Abs: 3.1 10*3/uL (ref 1.7–7.7)
Neutrophils Relative %: 59 %
Platelets: 356 10*3/uL (ref 150–400)
RBC: 3.35 MIL/uL — ABNORMAL LOW (ref 3.87–5.11)
RDW: 14.6 % (ref 11.5–15.5)
WBC: 5.4 10*3/uL (ref 4.0–10.5)
nRBC: 0 % (ref 0.0–0.2)

## 2020-05-16 LAB — COMPREHENSIVE METABOLIC PANEL
ALT: 28 U/L (ref 0–44)
AST: 36 U/L (ref 15–41)
Albumin: 3.4 g/dL — ABNORMAL LOW (ref 3.5–5.0)
Alkaline Phosphatase: 222 U/L — ABNORMAL HIGH (ref 38–126)
Anion gap: 11 (ref 5–15)
BUN: 16 mg/dL (ref 8–23)
CO2: 25 mmol/L (ref 22–32)
Calcium: 9.4 mg/dL (ref 8.9–10.3)
Chloride: 105 mmol/L (ref 98–111)
Creatinine, Ser: 1.23 mg/dL — ABNORMAL HIGH (ref 0.44–1.00)
GFR calc Af Amer: 52 mL/min — ABNORMAL LOW (ref >60)
GFR calc non Af Amer: 45 mL/min — ABNORMAL LOW (ref >60)
Glucose, Bld: 109 mg/dL — ABNORMAL HIGH (ref 70–99)
Potassium: 4.7 mmol/L (ref 3.5–5.1)
Sodium: 141 mmol/L (ref 135–145)
Total Bilirubin: 0.4 mg/dL (ref 0.3–1.2)
Total Protein: 6.8 g/dL (ref 6.5–8.1)

## 2020-05-16 LAB — SARS CORONAVIRUS 2 (TAT 6-24 HRS): SARS Coronavirus 2: NEGATIVE

## 2020-05-16 LAB — PREPARE RBC (CROSSMATCH)

## 2020-05-16 LAB — HEMOGLOBIN A1C
Hgb A1c MFr Bld: 5.8 % — ABNORMAL HIGH (ref 4.8–5.6)
Mean Plasma Glucose: 119.76 mg/dL

## 2020-05-16 LAB — LIPASE, BLOOD: Lipase: 31 U/L (ref 11–51)

## 2020-05-16 LAB — PROTIME-INR
INR: 1 (ref 0.8–1.2)
Prothrombin Time: 12.7 seconds (ref 11.4–15.2)

## 2020-05-16 NOTE — Progress Notes (Signed)
PCP - Dr. August Summer [facility] Cardiologist - Dr. Raechel Chute [place], [place])  PPM/ICD - denies  Chest x-ray - N/A EKG - 12/11/2019 Stress Test - denies ECHO - per patient within the last year (records requested) Cardiac Cath - denies  Sleep Study - denies CPAP - N/A  DM: denies  Blood Thinner Instructions: N/A Aspirin Instructions: per patient last dose 05/12/2020  ERAS Protcol - Yes PRE-SURGERY Ensure or G2- 3 Ensure given  COVID TEST- 05/16/2020, Test results pending  Anesthesia review: YES, cardiac records requested  Patient denies shortness of breath, fever, cough and chest pain at PAT appointment  All instructions explained to the patient, with a verbal understanding of the material. Patient agrees to go over the instructions while at home for a better understanding. Patient also instructed to self quarantine after being tested for COVID-19. The opportunity to ask questions was provided.

## 2020-05-16 NOTE — Progress Notes (Addendum)
Per surgeon's instructions:  -- Drink two (2) Pre-Surgery Ensure the day before surgery         One (1) at dinner time          One (1) at bedtime  -- Drink one (1) Pre-Surgery Ensure the morning of surgery by 4:30 A.M. the morning of surgery   ---Pre-Surgery Ensure was provided to you at your PAT appointment---

## 2020-05-16 NOTE — H&P (Signed)
Cheryll Dessert Location: [facility] Patient #: [id number] DOB: 1951/02/15 Married / Language: English / Race: White Female   History of Present Illness The patient is a 69 year old female who presents for a follow-up for Pancreatic cancer. Pt is a lovely 69 yo F referred for dx of pancreatic cancer 11/2019. She started having issues in February of weight loss, mild to moderate upper abdominal pain and bloating. She also developed jaundice over the next few weeks. Images were concerning for pancreatic head mass (3/15 [facility]). Subsequent MR confirmed this and showed double duct sign. She was referred to Dr. Ardis Hughs and had EUS/ERCP. She was seen to have 15 mm of portal vein involvement. She continues to have constipation. She has gotten her port and first dose of chemo last week. Her urine and BMs have returned to normal color. She is taking miralax and stool softeners and is still only having 1 BM every 3 days, but feels improved. She denies diarrhea. She still has some abdominal pain, but it is controlled now with tylenol.   Her paternal aunt had breast cancer. Genetic testing was negative.   She had prior open heart surgery at age 37 for ASD via right thoracotomy. Recent evaluation by cardiology last fall was "good" with echo and EKG. This was in [place].  She has been receiving gem/abraxane. CA 19-9 has come down from 2200 to 141. She has no abdominal pain. She has gained a bit of weight back. She denies recurrent jaundice. Metal stent is in good position.   CT 03/31/2020 FINDINGS: Lower chest: Mild cylindrical bronchiectasis again identified in both lung bases, likely post infectious or inflammatory.  Mild cardiomegaly, without pericardial or pleural effusion. Incompletely imaged central line in the right atrium.  Hepatobiliary: Segment 8 hypoattenuating liver lesion of 8 mm on 26/2 is similar to on the prior. Too small to characterize.  No new  liver lesion. Pneumobilia. Common duct stent in place.  Normal gallbladder.  Pancreas: Pancreatic body/tail atrophy and duct dilatation persist. The pancreatic head/neck lesion is significantly decreased. Somewhat difficult to delineate, but felt to measure on the order of 1.6 x 0.9 cm on 44/2. Compare 3.1 x 1.9 cm on the prior exam.  No superimposed acute pancreatitis.  Spleen: Normal in size, without focal abnormality.  Adrenals/Urinary Tract: Normal adrenal glands. Interpolar right renal 1.0 cm cyst or minimally complex cyst. Normal left kidney. No hydronephrosis. Normal urinary bladder.  Stomach/Bowel: Gastric antral underdistention. Tiny periampullary duodenal diverticulum. Otherwise normal small bowel. Normal terminal ileum and appendix. Normal colon.  Vascular/Lymphatic: Advanced aortic and branch vessel atherosclerosis. Celiac and SMA are uninvolved with tumor.  Persistent contact over less than 180 degree portion of the inferior portal vein and splenoportal confluence on images 41 through 43 of series 2.  Portal caval node measures 11 mm on 42/2 and is similar.  Gastrohepatic ligament node measures 7 mm on 37/2 versus 6 mm on the prior  No pelvic sidewall adenopathy.  Reproductive: Hysterectomy. No adnexal mass. Bilateral tubal ligation.  Other: No significant free fluid. Moderate pelvic floor laxity. No evidence of omental or peritoneal disease.  A tiny periumbilical fat containing ventral abdominal wall hernia.  Musculoskeletal: Mild osteopenia. L3-4 and L4-5 disc bulges. L4-5 degenerative disc disease. S-shaped thoracic spine curvature.  IMPRESSION: 1. Response to therapy of pancreatic head/neck primary. 2. Similar size of indeterminate upper abdominal borderline size lymph nodes. 3. Similar segment 8 liver lesion which is too small to characterize. 4. Aortic Atherosclerosis (  ICD10-I70.0).     Allergies  Penicillins  Sulfa Antibiotics   Ultram *ANALGESICS - OPIOID*  Allergies Reconciled   Medication History  HYDROcodone-Acetaminophen (5-325MG  Tablet, Oral) Active. Albuterol Sulfate HFA (108 (90 Base)MCG/ACT Aerosol Soln, Inhalation) Active. Atorvastatin Calcium (40MG  Tablet, Oral) Active. Dicyclomine HCl (10MG  Capsule, Oral) Active. Losartan Potassium (50MG  Tablet, Oral) Active. Mirtazapine (7.5MG  Tablet, Oral) Active. Bentyl (10MG  Capsule, Oral) Active. Compazine (10MG  Tablet, Oral) Active. Aspirin (81MG  Tablet, Oral) Active. Calcium (Oral) Specific strength unknown - Active. Fish Oil (Oral) Specific strength unknown - Active. Magnesium Hydroxide (Oral) Specific strength unknown - Active. Medications Reconciled    Review of Systems All other systems negative  Vitals Weight: 127 lb Height: 61in Body Surface Area: 1.56 m Body Mass Index: 24 kg/m  Temp.: 97.42F  Pulse: 98 (Regular)  P.OX: 98% (Room air) BP: 122/68(Sitting, Left Arm, Standard)       Physical Exam  General Mental Status-Alert. General Appearance-Consistent with stated age. Hydration-Well hydrated. Voice-Normal.  Head and Neck Head-normocephalic, atraumatic with no lesions or palpable masses.  Eye Sclera/Conjunctiva - Bilateral-No scleral icterus.  Chest and Lung Exam Chest and lung exam reveals -quiet, even and easy respiratory effort with no use of accessory muscles. Inspection Chest Wall - Normal. Back - normal.  Breast - Did not examine.  Cardiovascular Cardiovascular examination reveals -normal pedal pulses bilaterally. Note: regular rate and rhythm  Abdomen Inspection-Inspection Normal. Palpation/Percussion Palpation and Percussion of the abdomen reveal - Soft, Non Tender, No Rebound tenderness, No Rigidity (guarding) and No hepatosplenomegaly.  Peripheral Vascular Upper Extremity Inspection - Bilateral - Normal - No Clubbing, No Cyanosis, No Edema, Pulses  Intact. Lower Extremity Palpation - Edema - Bilateral - No edema - Bilateral.  Neurologic Neurologic evaluation reveals -alert and oriented x 3 with no impairment of recent or remote memory. Mental Status-Normal.  Musculoskeletal Global Assessment -Note: no gross deformities.  Normal Exam - Left-Upper Extremity Strength Normal and Lower Extremity Strength Normal. Normal Exam - Right-Upper Extremity Strength Normal and Lower Extremity Strength Normal.  Lymphatic Head & Neck  General Head & Neck Lymphatics: Bilateral - Description - Normal. Axillary  General Axillary Region: Bilateral - Description - Normal. Tenderness - Non Tender.    Assessment & Plan  PRIMARY ADENOCARCINOMA OF HEAD OF PANCREAS (C25.0) Impression: Pt has dx of a cT3N1 adenocarcinoma of the pancreatic head. She is now s/p chemotherapy wtih gem abraxane and has had good response. She has tolerated chemo well.  Will get chest CT. Will plan whipple as long as no surprises on chest CT. I reviewed surgery and possible complications with patient and her husband.   I discussed the surgery with the patient including diagrams of anatomy. I discussed the potential for diagnostic laparoscopy. In the case of pancreatic cancer, if spread of the disease is found, we will abort the procedure and not proceed with resection. The rationale for this was discussed with the patient. There has not been data to support resection of Stage IV disease in terms of survival benefit.  We discussed possible complications including: Potential of aborting procedure if tumor is invading the superior mesenteric or hepatic arteries Bleeding Infection and possible wound complications such as hernia Damage to adjacent structures Leak of anastamoses, primarily pancreatic Possible need for other procedures Possible prolonged nausea with possible need for external feeding. Possible prolonged hospital stay. Possible development of  diabetes or worsening of current diabetes. Possible pancreatic exocrine insufficiency Prolonged fatigue/weakness/appetite Possible early recurrence of cancer Current Plans You are being scheduled for  surgery- Our schedulers will call you.  You should hear from our office's scheduling department within 5 working days about the location, date, and time of surgery. We try to make accommodations for patient's preferences in scheduling surgery, but sometimes the OR schedule or the surgeon's schedule prevents [place] from making those accommodations.  If you have not heard from our office [phone]) in 5 working days, call the office and ask for your surgeon's nurse.  If you have other questions about your diagnosis, plan, or surgery, call the office and ask for your surgeon's nurse.  Pt Education - flb whipple pt info The use of home mechanical compression devices is a proven alternative which safely decreases the rate of post-surgical DVT (Bartlett MA, Mauck Salvadore Dom PR. Prevention of venous thromboembolism in patients undergoing bariatric surgery. [place]. 2015; 41:583-094. Published 2015 Aug 17. doi:10.2147/VHRM.S73799) and should be approved without delay for this patient.  Order: Location manager Device for home use.

## 2020-05-17 ENCOUNTER — Encounter (HOSPITAL_COMMUNITY): Payer: Self-pay

## 2020-05-17 NOTE — Progress Notes (Addendum)
Anesthesia Chart Review:  Case: [id number] Date/Time: 06/07/2020 0715   Procedures:      LAPAROSCOPY DIAGNOSTIC (N/A )     WHIPPLE PROCEDURE (N/A )     JEJUNOSTOMY TUBE PLACEMENT (N/A )   Anesthesia type: General   Pre-op diagnosis: pancreatic cancer   Location: [facility] OR ROOM 02 / [place] [facility]   Surgeons: Stark Klein, MD      DISCUSSION: Patient is a 69 year old [name] scheduled for the above procedure. She had a CT abd/pelvis on 11/30/19 at [facility] for evaluation of abdominal distention with some weight loss. This showed a dilated pancreatic duct, with 12/02/19 MRI findings concerning for pancreatic mass. She underwent FNA of pancreatic head on 12/11/19 by Dr. Ardis Hughs on 12/11/19 that was positive for adenocarcinoma.  History includes never smoker, ASD repair (age 12 via right thoracotomy approach, ~ 69 at [facility]), HTN, hypercholesterolemia, palpitations, asthma, hypothyroidism, anxiety, pancreatic cancer (diagnosed 12/11/19; s/p biliary stent 12/11/19; started chemotherapy 01/07/20). Port-a-cath inserted 01/04/20.   Received Abraxane and Gemzar last on 04/29/20. She saw Dr. Benay Spice on that day, and he is aware of surgery plans.   She reports an echo with Dr. Darral Dash within the past two years. Records requested.   By notes, she has received COVID-19 Moderna vaccine x2. 05/16/20 presurgical COVID-19 test was negative. Anesthesia team to evaluate on the day of surgery. Case is posted for general with orders for epidural block. Needs 2nd T&S on the day of surgery.    ADDENDUM 05/18/20 9:42 AM: Records received from [facility]. Last visit 09/22/19 with Dr. 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 Echo was done 08/2019 and showed normal size of all cardiac chambers, specifically the right-sided chambers with no evidence of volume overload and no evidence of remaining left to  right shunt.  Reassurance provided, although discussed that her history places her at increased risk for future atrial arrhythmias. In regards to palpitations, her strips showed NSR and "appears that her palpitations at this time are mostly related to simple extrasystoles." His note also mentions that a thyroid biopsy was planned following chest CT.       VS: BP (!) 156/73   Pulse 83   Temp 36.8 C (Oral)   Resp 18   Wt 57.7 kg   SpO2 97%   BMI 24.03 kg/m     PROVIDERS: [place], August Summer, FNP is PCP [facility] Internal Medicine - Angelina Sheriff) Owens Loffler, MD is GI  Betsy Coder, MD is HEM-ONC Raechel Chute, MD is cardiologist ([place]) Alphonzo Lemmings, MD is pulmonologist ([place])   LABS: Labs reviewed: Acceptable for surgery. (all labs ordered are listed, but only abnormal results are displayed)  Labs Reviewed  CBC WITH DIFFERENTIAL/PLATELET - Abnormal; Notable for the following components:      Result Value   RBC 3.35 (*)    Hemoglobin 11.0 (*)    HCT 34.4 (*)    MCV 102.7 (*)    All other components within normal limits  COMPREHENSIVE METABOLIC PANEL - Abnormal; Notable for the following components:   Glucose, Bld 109 (*)    Creatinine, Ser 1.23 (*)    Albumin 3.4 (*)    Alkaline Phosphatase 222 (*)    GFR calc non Af Amer 45 (*)    GFR calc Af Amer 52 (*)  All other components within normal limits  HEMOGLOBIN A1C - Abnormal; Notable for the following components:   Hgb A1c MFr Bld 5.8 (*)    All other components within normal limits  LIPASE, BLOOD  PROTIME-INR  PREPARE RBC (CROSSMATCH)  TYPE AND SCREEN     IMAGES: CT chest 04/19/20: IMPRESSION: 1. Stable appearance of bilateral pulmonary nodules. No new nodules identified. 2. Pancreatic head mass is not entirely included within the field of view. Diffuse pancreatic ductal dilatation and parenchymal atrophy involving the body and tail. Stent in the  common bile duct with pneumobilia. 3. Vague indeterminate low-attenuation lesion in segment 7 of the liver is unchanged. 4. Diffuse nodular enlargement of the thyroid gland. Consider further evaluation with thyroid ultrasound. If patient is clinically hyperthyroid, consider nuclear medicine thyroid uptake and scan. 5. Aortic atherosclerosis.  CT abd/pelvis 03/31/20: IMPRESSION: 1. Response to therapy of pancreatic head/neck primary. 2. Similar size of indeterminate upper abdominal borderline size lymph nodes. 3. Similar segment 8 liver lesion which is too small to characterize. 4.  Aortic Atherosclerosis (ICD10-I70.0).   EKG: 12/11/19: NSR   CV: She denied prior stress test and cath.  Echo 08/18/19 ([facility]): Impression:  1.  Normal LV size with normal systolic function.  The ejection fraction is 55%.  There is borderline left ventricular wall thickness.  Preserved LV diastolic relaxation with normal estimated LV filling pressure. 2.  Normal right ventricular size with normal function.  RA pressure estimated as normal with small IVC caliber and near complete collapse on inspiration. 3.  Normal pulmonary artery systolic pressure.  PASP 26 mmHg. 4.  Structurally and functionally normal cardiac valves. 5.  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  No evidence of RV overload or enlargement.   Past Medical History:  Diagnosis Date  . Abdominal distention 12/07/2019  . 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 Anxiety    per patient  . Asthma    "adult" asthma  . Atrial septal defect, sinus venosus 12/07/2019  . Common bile duct dilation 12/07/2019  . Complication of anesthesia    had trouble waking up on her first colonoscopy  . Dilated pancreatic duct 12/07/2019  . Dysrhythmia    palpitatations  . Elevated lipase 12/07/2019  . Essential hypertension 12/07/2019  . Family history of breast cancer   . History of kidney stones    on xray only  . Hypothyroidism 12/07/2019    "being watched" not on medications at this time  . Loss of weight 12/07/2019  . Nausea 12/07/2019  . pancreatic ca 11/2019  . Pancreatic mass 12/07/2019  . Pneumonia   . Pure hypercholesterolemia 12/07/2019    Past Surgical History:  Procedure Laterality Date  . ASD REPAIR, SINUS VENOSUS     at age 59   . BILIARY STENT PLACEMENT  12/11/2019   Procedure: BILIARY STENT PLACEMENT;  Surgeon: [name], MD;  Location: [facility] ENDOSCOPY;  Service: Endoscopy;;  . COLONOSCOPY    . ENDOSCOPIC RETROGRADE CHOLANGIOPANCREATOGRAPHY (ERCP) WITH PROPOFOL N/A 12/11/2019   Procedure: ENDOSCOPIC RETROGRADE CHOLANGIOPANCREATOGRAPHY (ERCP) WITH PROPOFOL;  Surgeon: [name], MD;  Location: [facility] ENDOSCOPY;  Service: Endoscopy;  Laterality: N/A;  . ESOPHAGOGASTRODUODENOSCOPY (EGD) WITH PROPOFOL N/A 12/11/2019   Procedure: ESOPHAGOGASTRODUODENOSCOPY (EGD) WITH PROPOFOL;  Surgeon: [name], MD;  Location: [facility] ENDOSCOPY;  Service: Endoscopy;  Laterality: N/A;  . FINE NEEDLE ASPIRATION  12/11/2019   Procedure: FINE NEEDLE ASPIRATION (FNA) LINEAR;  Surgeon: [name], MD;  Location: [place];  Service: Endoscopy;;  . IR IMAGING GUIDED PORT INSERTION  01/04/2020  . TONSILLECTOMY    . TUBAL LIGATION    . UPPER ESOPHAGEAL ENDOSCOPIC ULTRASOUND (EUS) N/A 12/11/2019   Procedure: UPPER ESOPHAGEAL ENDOSCOPIC ULTRASOUND (EUS);  Surgeon: [name], MD;  Location: [facility] ENDOSCOPY;  Service: Endoscopy;  Laterality: N/A;    MEDICATIONS: . acetaminophen (TYLENOL) 500 MG tablet  . albuterol (VENTOLIN HFA) 108 (90 Base) MCG/ACT inhaler  . aspirin EC 81 MG tablet  . dicyclomine (BENTYL) 10 MG capsule  . Homeopathic Products (ARNICA EX)  . HYDROcodone-acetaminophen (NORCO/VICODIN) 5-325 MG tablet  . lidocaine-prilocaine (EMLA) cream  . LORazepam (ATIVAN) 0.5 MG tablet  . losartan (COZAAR) 50 MG tablet  . mirtazapine (REMERON) 7.5 MG tablet  . ondansetron (ZOFRAN-ODT) 8 MG disintegrating tablet  .  oxymetazoline (VICKS SINEX) 0.05 % nasal spray  . polyethylene glycol (MIRALAX / GLYCOLAX) 17 g packet  . prochlorperazine (COMPAZINE) 10 MG tablet  . senna-docusate (SENOKOT-S) 8.6-50 MG tablet   No current facility-administered medications for this encounter.  Last ASA 05/12/20.    Myra Gianotti, PA-C Surgical Short Stay/Anesthesiology [facility] Phone [phone] [facility] Phone [phone] 05/17/2020 12:49 PM

## 2020-05-17 NOTE — Anesthesia Preprocedure Evaluation (Addendum)
Anesthesia Evaluation  Patient identified by MRN, date of birth, ID band  Reviewed: Allergy & Precautions, NPO status , Patient's Chart, lab work & pertinent test results  Airway Mallampati: II  TM Distance: >3 FB Neck ROM: Full    Dental no notable dental hx.    Pulmonary asthma ,    Pulmonary exam normal breath sounds clear to auscultation       Cardiovascular Exercise Tolerance: Good hypertension,  Rhythm:Regular Rate:Normal  Echo 08/18/19: Impression:  1.  Normal LV size with normal systolic function.  The ejection fraction is 55%.  There is borderline left ventricular wall thickness.  Preserved LV diastolic relaxation with normal estimated LV filling pressure. 2.  Normal right ventricular size with normal function.  RA pressure estimated as normal with small IVC caliber and near complete collapse on inspiration. 3.  Normal pulmonary artery systolic pressure.  PASP 26 mmHg. 4.  Structurally and functionally normal cardiac valves. 5.  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  No evidence of RV overload or enlargement.     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

## 2020-05-18 ENCOUNTER — Encounter (HOSPITAL_COMMUNITY): Payer: Self-pay

## 2020-05-18 ENCOUNTER — Encounter (HOSPITAL_COMMUNITY): Payer: Self-pay | Admitting: General Surgery

## 2020-05-19 ENCOUNTER — Encounter (HOSPITAL_COMMUNITY): Admission: RE | Disposition: E | Payer: Self-pay | Source: Home / Self Care | Attending: General Surgery

## 2020-05-19 ENCOUNTER — Inpatient Hospital Stay (HOSPITAL_COMMUNITY)
Admission: RE | Admit: 2020-05-19 | Discharge: 2020-06-17 | DRG: 405 | Disposition: E | Payer: Medicare Other | Attending: General Surgery | Admitting: General Surgery

## 2020-05-19 ENCOUNTER — Inpatient Hospital Stay (HOSPITAL_COMMUNITY): Payer: Medicare Other | Admitting: Vascular Surgery

## 2020-05-19 ENCOUNTER — Inpatient Hospital Stay (HOSPITAL_COMMUNITY): Payer: Medicare Other | Admitting: Anesthesiology

## 2020-05-19 ENCOUNTER — Encounter (HOSPITAL_COMMUNITY): Payer: Self-pay | Admitting: General Surgery

## 2020-05-19 ENCOUNTER — Other Ambulatory Visit: Payer: Self-pay

## 2020-05-19 ENCOUNTER — Inpatient Hospital Stay (HOSPITAL_COMMUNITY): Payer: Medicare Other

## 2020-05-19 DIAGNOSIS — Z9689 Presence of other specified functional implants: Secondary | ICD-10-CM | POA: Diagnosis present

## 2020-05-19 DIAGNOSIS — D684 Acquired coagulation factor deficiency: Secondary | ICD-10-CM | POA: Diagnosis not present

## 2020-05-19 DIAGNOSIS — Z515 Encounter for palliative care: Secondary | ICD-10-CM | POA: Diagnosis not present

## 2020-05-19 DIAGNOSIS — C259 Malignant neoplasm of pancreas, unspecified: Secondary | ICD-10-CM | POA: Diagnosis present

## 2020-05-19 DIAGNOSIS — E874 Mixed disorder of acid-base balance: Secondary | ICD-10-CM | POA: Diagnosis not present

## 2020-05-19 DIAGNOSIS — Z882 Allergy status to sulfonamides status: Secondary | ICD-10-CM

## 2020-05-19 DIAGNOSIS — I10 Essential (primary) hypertension: Secondary | ICD-10-CM | POA: Diagnosis present

## 2020-05-19 DIAGNOSIS — J45909 Unspecified asthma, uncomplicated: Secondary | ICD-10-CM | POA: Diagnosis present

## 2020-05-19 DIAGNOSIS — R7989 Other specified abnormal findings of blood chemistry: Secondary | ICD-10-CM

## 2020-05-19 DIAGNOSIS — R000 Tachycardia, unspecified: Secondary | ICD-10-CM | POA: Diagnosis not present

## 2020-05-19 DIAGNOSIS — Z803 Family history of malignant neoplasm of breast: Secondary | ICD-10-CM

## 2020-05-19 DIAGNOSIS — Z820 Family history of epilepsy and other diseases of the nervous system: Secondary | ICD-10-CM

## 2020-05-19 DIAGNOSIS — I81 Portal vein thrombosis: Secondary | ICD-10-CM | POA: Diagnosis not present

## 2020-05-19 DIAGNOSIS — Z23 Encounter for immunization: Secondary | ICD-10-CM

## 2020-05-19 DIAGNOSIS — G9349 Other encephalopathy: Secondary | ICD-10-CM | POA: Diagnosis not present

## 2020-05-19 DIAGNOSIS — R571 Hypovolemic shock: Secondary | ICD-10-CM | POA: Diagnosis not present

## 2020-05-19 DIAGNOSIS — Q211 Atrial septal defect: Secondary | ICD-10-CM

## 2020-05-19 DIAGNOSIS — C250 Malignant neoplasm of head of pancreas: Principal | ICD-10-CM | POA: Diagnosis present

## 2020-05-19 DIAGNOSIS — N179 Acute kidney failure, unspecified: Secondary | ICD-10-CM

## 2020-05-19 DIAGNOSIS — R630 Anorexia: Secondary | ICD-10-CM | POA: Diagnosis present

## 2020-05-19 DIAGNOSIS — E440 Moderate protein-calorie malnutrition: Secondary | ICD-10-CM | POA: Diagnosis present

## 2020-05-19 DIAGNOSIS — E7800 Pure hypercholesterolemia, unspecified: Secondary | ICD-10-CM | POA: Diagnosis present

## 2020-05-19 DIAGNOSIS — M8580 Other specified disorders of bone density and structure, unspecified site: Secondary | ICD-10-CM | POA: Diagnosis present

## 2020-05-19 DIAGNOSIS — Z66 Do not resuscitate: Secondary | ICD-10-CM | POA: Diagnosis not present

## 2020-05-19 DIAGNOSIS — Z0189 Encounter for other specified special examinations: Secondary | ICD-10-CM

## 2020-05-19 DIAGNOSIS — Z800 Family history of malignant neoplasm of digestive organs: Secondary | ICD-10-CM

## 2020-05-19 DIAGNOSIS — R739 Hyperglycemia, unspecified: Secondary | ICD-10-CM | POA: Diagnosis not present

## 2020-05-19 DIAGNOSIS — F419 Anxiety disorder, unspecified: Secondary | ICD-10-CM | POA: Diagnosis present

## 2020-05-19 DIAGNOSIS — Z8249 Family history of ischemic heart disease and other diseases of the circulatory system: Secondary | ICD-10-CM | POA: Diagnosis not present

## 2020-05-19 DIAGNOSIS — I9581 Postprocedural hypotension: Secondary | ICD-10-CM | POA: Diagnosis not present

## 2020-05-19 DIAGNOSIS — Z20822 Contact with and (suspected) exposure to covid-19: Secondary | ICD-10-CM | POA: Diagnosis present

## 2020-05-19 DIAGNOSIS — Z880 Allergy status to penicillin: Secondary | ICD-10-CM

## 2020-05-19 DIAGNOSIS — R579 Shock, unspecified: Secondary | ICD-10-CM

## 2020-05-19 DIAGNOSIS — R34 Anuria and oliguria: Secondary | ICD-10-CM | POA: Diagnosis not present

## 2020-05-19 DIAGNOSIS — R4189 Other symptoms and signs involving cognitive functions and awareness: Secondary | ICD-10-CM | POA: Diagnosis not present

## 2020-05-19 DIAGNOSIS — Z818 Family history of other mental and behavioral disorders: Secondary | ICD-10-CM

## 2020-05-19 DIAGNOSIS — K7200 Acute and subacute hepatic failure without coma: Secondary | ICD-10-CM | POA: Diagnosis present

## 2020-05-19 DIAGNOSIS — R54 Age-related physical debility: Secondary | ICD-10-CM | POA: Diagnosis present

## 2020-05-19 DIAGNOSIS — E861 Hypovolemia: Secondary | ICD-10-CM | POA: Diagnosis not present

## 2020-05-19 DIAGNOSIS — Z825 Family history of asthma and other chronic lower respiratory diseases: Secondary | ICD-10-CM

## 2020-05-19 DIAGNOSIS — J9601 Acute respiratory failure with hypoxia: Secondary | ICD-10-CM | POA: Diagnosis not present

## 2020-05-19 DIAGNOSIS — Z885 Allergy status to narcotic agent status: Secondary | ICD-10-CM

## 2020-05-19 DIAGNOSIS — I82890 Acute embolism and thrombosis of other specified veins: Secondary | ICD-10-CM | POA: Diagnosis present

## 2020-05-19 DIAGNOSIS — Z9221 Personal history of antineoplastic chemotherapy: Secondary | ICD-10-CM

## 2020-05-19 DIAGNOSIS — Z452 Encounter for adjustment and management of vascular access device: Secondary | ICD-10-CM

## 2020-05-19 DIAGNOSIS — K5900 Constipation, unspecified: Secondary | ICD-10-CM | POA: Diagnosis present

## 2020-05-19 DIAGNOSIS — R140 Abdominal distension (gaseous): Secondary | ICD-10-CM | POA: Diagnosis present

## 2020-05-19 DIAGNOSIS — E878 Other disorders of electrolyte and fluid balance, not elsewhere classified: Secondary | ICD-10-CM | POA: Diagnosis not present

## 2020-05-19 DIAGNOSIS — Z833 Family history of diabetes mellitus: Secondary | ICD-10-CM

## 2020-05-19 DIAGNOSIS — Z6824 Body mass index (BMI) 24.0-24.9, adult: Secondary | ICD-10-CM

## 2020-05-19 DIAGNOSIS — E039 Hypothyroidism, unspecified: Secondary | ICD-10-CM | POA: Diagnosis present

## 2020-05-19 DIAGNOSIS — E162 Hypoglycemia, unspecified: Secondary | ICD-10-CM | POA: Diagnosis not present

## 2020-05-19 DIAGNOSIS — Z7982 Long term (current) use of aspirin: Secondary | ICD-10-CM

## 2020-05-19 DIAGNOSIS — K838 Other specified diseases of biliary tract: Secondary | ICD-10-CM | POA: Diagnosis present

## 2020-05-19 DIAGNOSIS — Z79899 Other long term (current) drug therapy: Secondary | ICD-10-CM

## 2020-05-19 HISTORY — PX: LAPAROSCOPY: SHX197

## 2020-05-19 HISTORY — PX: WHIPPLE PROCEDURE: SHX2667

## 2020-05-19 LAB — POCT I-STAT 7, (LYTES, BLD GAS, ICA,H+H)
Acid-base deficit: 1 mmol/L (ref 0.0–2.0)
Acid-base deficit: 1 mmol/L (ref 0.0–2.0)
Acid-base deficit: 2 mmol/L (ref 0.0–2.0)
Acid-base deficit: 11 mmol/L — ABNORMAL HIGH (ref 0.0–2.0)
Acid-base deficit: 11 mmol/L — ABNORMAL HIGH (ref 0.0–2.0)
Acid-base deficit: 14 mmol/L — ABNORMAL HIGH (ref 0.0–2.0)
Bicarbonate: 24.3 mmol/L (ref 20.0–28.0)
Bicarbonate: 24.6 mmol/L (ref 20.0–28.0)
Bicarbonate: 22.3 mmol/L (ref 20.0–28.0)
Bicarbonate: 15.9 mmol/L — ABNORMAL LOW (ref 20.0–28.0)
Bicarbonate: 16.1 mmol/L — ABNORMAL LOW (ref 20.0–28.0)
Bicarbonate: 13.1 mmol/L — ABNORMAL LOW (ref 20.0–28.0)
Calcium, Ion: 1.18 mmol/L (ref 1.15–1.40)
Calcium, Ion: 1.19 mmol/L (ref 1.15–1.40)
Calcium, Ion: 1.12 mmol/L — ABNORMAL LOW (ref 1.15–1.40)
Calcium, Ion: 0.81 mmol/L — CL (ref 1.15–1.40)
Calcium, Ion: 0.87 mmol/L — CL (ref 1.15–1.40)
Calcium, Ion: 0.98 mmol/L — ABNORMAL LOW (ref 1.15–1.40)
HCT: 25 % — ABNORMAL LOW (ref 36.0–46.0)
HCT: 24 % — ABNORMAL LOW (ref 36.0–46.0)
HCT: 22 % — ABNORMAL LOW (ref 36.0–46.0)
HCT: 35 % — ABNORMAL LOW (ref 36.0–46.0)
HCT: 37 % (ref 36.0–46.0)
HCT: 41 % (ref 36.0–46.0)
Hemoglobin: 8.5 g/dL — ABNORMAL LOW (ref 12.0–15.0)
Hemoglobin: 8.2 g/dL — ABNORMAL LOW (ref 12.0–15.0)
Hemoglobin: 7.5 g/dL — ABNORMAL LOW (ref 12.0–15.0)
Hemoglobin: 11.9 g/dL — ABNORMAL LOW (ref 12.0–15.0)
Hemoglobin: 12.6 g/dL (ref 12.0–15.0)
Hemoglobin: 13.9 g/dL (ref 12.0–15.0)
O2 Saturation: 100 %
O2 Saturation: 100 %
O2 Saturation: 100 %
O2 Saturation: 100 %
O2 Saturation: 100 %
O2 Saturation: 94 %
Patient temperature: 35.4
Patient temperature: 35.8
Patient temperature: 36.4
Patient temperature: 97.8
Potassium: 3.3 mmol/L — ABNORMAL LOW (ref 3.5–5.1)
Potassium: 3.8 mmol/L (ref 3.5–5.1)
Potassium: 4 mmol/L (ref 3.5–5.1)
Potassium: 4.7 mmol/L (ref 3.5–5.1)
Potassium: 4.6 mmol/L (ref 3.5–5.1)
Potassium: 4.3 mmol/L (ref 3.5–5.1)
Sodium: 139 mmol/L (ref 135–145)
Sodium: 138 mmol/L (ref 135–145)
Sodium: 139 mmol/L (ref 135–145)
Sodium: 139 mmol/L (ref 135–145)
Sodium: 138 mmol/L (ref 135–145)
Sodium: 139 mmol/L (ref 135–145)
TCO2: 26 mmol/L (ref 22–32)
TCO2: 26 mmol/L (ref 22–32)
TCO2: 23 mmol/L (ref 22–32)
TCO2: 17 mmol/L — ABNORMAL LOW (ref 22–32)
TCO2: 17 mmol/L — ABNORMAL LOW (ref 22–32)
TCO2: 14 mmol/L — ABNORMAL LOW (ref 22–32)
pCO2 arterial: 37.3 mmHg (ref 32.0–48.0)
pCO2 arterial: 40.1 mmHg (ref 32.0–48.0)
pCO2 arterial: 36.9 mmHg (ref 32.0–48.0)
pCO2 arterial: 36.3 mmHg (ref 32.0–48.0)
pCO2 arterial: 39.9 mmHg (ref 32.0–48.0)
pCO2 arterial: 32.8 mmHg (ref 32.0–48.0)
pH, Arterial: 7.415 (ref 7.350–7.450)
pH, Arterial: 7.391 (ref 7.350–7.450)
pH, Arterial: 7.39 (ref 7.350–7.450)
pH, Arterial: 7.247 — ABNORMAL LOW (ref 7.350–7.450)
pH, Arterial: 7.215 — ABNORMAL LOW (ref 7.350–7.450)
pH, Arterial: 7.208 — ABNORMAL LOW (ref 7.350–7.450)
pO2, Arterial: 198 mmHg — ABNORMAL HIGH (ref 83.0–108.0)
pO2, Arterial: 217 mmHg — ABNORMAL HIGH (ref 83.0–108.0)
pO2, Arterial: 223 mmHg — ABNORMAL HIGH (ref 83.0–108.0)
pO2, Arterial: 243 mmHg — ABNORMAL HIGH (ref 83.0–108.0)
pO2, Arterial: 508 mmHg — ABNORMAL HIGH (ref 83.0–108.0)
pO2, Arterial: 82 mmHg — ABNORMAL LOW (ref 83.0–108.0)

## 2020-05-19 LAB — COMPREHENSIVE METABOLIC PANEL
ALT: 70 U/L — ABNORMAL HIGH (ref 0–44)
AST: 133 U/L — ABNORMAL HIGH (ref 15–41)
Albumin: 3.3 g/dL — ABNORMAL LOW (ref 3.5–5.0)
Alkaline Phosphatase: 97 U/L (ref 38–126)
Anion gap: 18 — ABNORMAL HIGH (ref 5–15)
BUN: 10 mg/dL (ref 8–23)
CO2: 14 mmol/L — ABNORMAL LOW (ref 22–32)
Calcium: 7.9 mg/dL — ABNORMAL LOW (ref 8.9–10.3)
Chloride: 103 mmol/L (ref 98–111)
Creatinine, Ser: 1.34 mg/dL — ABNORMAL HIGH (ref 0.44–1.00)
GFR calc Af Amer: 47 mL/min — ABNORMAL LOW (ref >60)
GFR calc non Af Amer: 41 mL/min — ABNORMAL LOW (ref >60)
Glucose, Bld: 327 mg/dL — ABNORMAL HIGH (ref 70–99)
Potassium: 4.5 mmol/L (ref 3.5–5.1)
Sodium: 135 mmol/L (ref 135–145)
Total Bilirubin: 3 mg/dL — ABNORMAL HIGH (ref 0.3–1.2)
Total Protein: 5.3 g/dL — ABNORMAL LOW (ref 6.5–8.1)

## 2020-05-19 LAB — PREPARE RBC (CROSSMATCH)

## 2020-05-19 LAB — ABO/RH: ABO/RH(D): O NEG

## 2020-05-19 LAB — MRSA PCR SCREENING: MRSA by PCR: NEGATIVE

## 2020-05-19 SURGERY — LAPAROSCOPY, DIAGNOSTIC
Anesthesia: Epidural | Site: Abdomen

## 2020-05-19 MED ORDER — ONDANSETRON HCL 4 MG/2ML IJ SOLN
INTRAMUSCULAR | Status: AC
  Filled 2020-05-19: qty 2

## 2020-05-19 MED ORDER — ALBUMIN HUMAN 5 % IV SOLN: INTRAVENOUS | Status: DC | PRN

## 2020-05-19 MED ORDER — SODIUM BICARBONATE 8.4 % IV SOLN
INTRAVENOUS | Status: AC
  Filled 2020-05-19: qty 50

## 2020-05-19 MED ORDER — MIDAZOLAM HCL 2 MG/2ML IJ SOLN
INTRAMUSCULAR | Status: AC
  Filled 2020-05-19: qty 2

## 2020-05-19 MED ORDER — PHENYLEPHRINE 40 MCG/ML (10ML) SYRINGE FOR IV PUSH (FOR BLOOD PRESSURE SUPPORT)
PREFILLED_SYRINGE | INTRAVENOUS | Status: DC | PRN
  Administered 2020-05-19 (×2): 80 ug via INTRAVENOUS
  Administered 2020-05-19: 40 ug via INTRAVENOUS
  Administered 2020-05-19: 80 ug via INTRAVENOUS
  Administered 2020-05-19: 40 ug via INTRAVENOUS

## 2020-05-19 MED ORDER — SCOPOLAMINE 1 MG/3DAYS TD PT72
1.0000 | MEDICATED_PATCH | TRANSDERMAL | Status: DC
  Administered 2020-05-19: 1.5 mg via TRANSDERMAL
  Filled 2020-05-19: qty 1

## 2020-05-19 MED ORDER — CHLORHEXIDINE GLUCONATE 0.12 % MT SOLN
15.0000 mL | Freq: Two times a day (BID) | OROMUCOSAL | Status: DC
  Administered 2020-05-20 – 2020-05-22 (×4): 15 mL via OROMUCOSAL
  Filled 2020-05-19: qty 15

## 2020-05-19 MED ORDER — ROCURONIUM BROMIDE 10 MG/ML (PF) SYRINGE
PREFILLED_SYRINGE | INTRAVENOUS | Status: AC
  Filled 2020-05-19: qty 10

## 2020-05-19 MED ORDER — LACTATED RINGERS IV SOLN: INTRAVENOUS | Status: DC | PRN

## 2020-05-19 MED ORDER — VISTASEAL 10 ML SINGLE DOSE KIT
10.0000 mL | PACK | Freq: Once | CUTANEOUS | Status: DC
  Filled 2020-05-19: qty 10

## 2020-05-19 MED ORDER — CHLORHEXIDINE GLUCONATE CLOTH 2 % EX PADS: 6.0000 | MEDICATED_PAD | Freq: Once | CUTANEOUS | Status: DC

## 2020-05-19 MED ORDER — ACETAMINOPHEN 10 MG/ML IV SOLN
1000.0000 mg | Freq: Four times a day (QID) | INTRAVENOUS | Status: DC
  Administered 2020-05-20: 1000 mg via INTRAVENOUS
  Filled 2020-05-19: qty 100

## 2020-05-19 MED ORDER — FENTANYL CITRATE (PF) 250 MCG/5ML IJ SOLN
INTRAMUSCULAR | Status: DC | PRN
  Administered 2020-05-19 (×3): 50 ug via INTRAVENOUS
  Administered 2020-05-19: 25 ug via INTRAVENOUS
  Administered 2020-05-19 (×3): 50 ug via INTRAVENOUS

## 2020-05-19 MED ORDER — CHLORHEXIDINE GLUCONATE 0.12 % MT SOLN
OROMUCOSAL | Status: AC
  Filled 2020-05-19: qty 15

## 2020-05-19 MED ORDER — SODIUM CHLORIDE 0.9% FLUSH: 9.0000 mL | INTRAVENOUS | Status: DC | PRN

## 2020-05-19 MED ORDER — LIDOCAINE-EPINEPHRINE 1 %-1:100000 IJ SOLN
INTRAMUSCULAR | Status: AC
  Filled 2020-05-19: qty 1

## 2020-05-19 MED ORDER — ENSURE PRE-SURGERY PO LIQD: 296.0000 mL | Freq: Once | ORAL | Status: DC

## 2020-05-19 MED ORDER — ONDANSETRON HCL 4 MG/2ML IJ SOLN
INTRAMUSCULAR | Status: DC | PRN
  Administered 2020-05-19: 4 mg via INTRAVENOUS

## 2020-05-19 MED ORDER — DEXAMETHASONE SODIUM PHOSPHATE 10 MG/ML IJ SOLN
INTRAMUSCULAR | Status: DC | PRN
  Administered 2020-05-19: 4 mg via INTRAVENOUS

## 2020-05-19 MED ORDER — MIDAZOLAM HCL 2 MG/2ML IJ SOLN
INTRAMUSCULAR | Status: DC | PRN
  Administered 2020-05-19 (×2): 1 mg via INTRAVENOUS

## 2020-05-19 MED ORDER — PNEUMOCOCCAL VAC POLYVALENT 25 MCG/0.5ML IJ INJ: 0.5000 mL | INJECTION | INTRAMUSCULAR | Status: DC

## 2020-05-19 MED ORDER — VISTASEAL 10 ML SINGLE DOSE KIT
PACK | CUTANEOUS | Status: DC | PRN
  Administered 2020-05-19: 10 mL via TOPICAL

## 2020-05-19 MED ORDER — SODIUM BICARBONATE 8.4 % IV SOLN
50.0000 meq | Freq: Once | INTRAVENOUS | Status: AC
  Administered 2020-05-19: 50 meq via INTRAVENOUS

## 2020-05-19 MED ORDER — DIPHENHYDRAMINE HCL 12.5 MG/5ML PO ELIX
12.5000 mg | ORAL_SOLUTION | Freq: Four times a day (QID) | ORAL | Status: DC | PRN
  Filled 2020-05-19: qty 5

## 2020-05-19 MED ORDER — FENTANYL 50 MCG/ML IV PCA SOLN
INTRAVENOUS | Status: AC
Start: 2020-05-19 — End: 2020-05-20
  Filled 2020-05-19: qty 20

## 2020-05-19 MED ORDER — FENTANYL 50 MCG/ML IV PCA SOLN
INTRAVENOUS | Status: DC
  Administered 2020-05-19: 30 ug via INTRAVENOUS
  Administered 2020-05-20: 0 ug via INTRAVENOUS
  Administered 2020-05-20: 40 ug via INTRAVENOUS

## 2020-05-19 MED ORDER — LIDOCAINE 2% (20 MG/ML) 5 ML SYRINGE
INTRAMUSCULAR | Status: DC | PRN
  Administered 2020-05-19: 80 mg via INTRAVENOUS

## 2020-05-19 MED ORDER — DEXAMETHASONE SODIUM PHOSPHATE 10 MG/ML IJ SOLN
INTRAMUSCULAR | Status: AC
  Filled 2020-05-19: qty 1

## 2020-05-19 MED ORDER — ORAL CARE MOUTH RINSE
15.0000 mL | Freq: Two times a day (BID) | OROMUCOSAL | Status: DC
  Administered 2020-05-20: 15 mL via OROMUCOSAL

## 2020-05-19 MED ORDER — 0.9 % SODIUM CHLORIDE (POUR BTL) OPTIME
TOPICAL | Status: DC | PRN
  Administered 2020-05-19 (×4): 1000 mL

## 2020-05-19 MED ORDER — METHOCARBAMOL 1000 MG/10ML IJ SOLN
500.0000 mg | Freq: Three times a day (TID) | INTRAVENOUS | Status: DC | PRN
  Filled 2020-05-19: qty 5

## 2020-05-19 MED ORDER — ENSURE PRE-SURGERY PO LIQD: 592.0000 mL | Freq: Once | ORAL | Status: DC

## 2020-05-19 MED ORDER — CIPROFLOXACIN IN D5W 400 MG/200ML IV SOLN
400.0000 mg | INTRAVENOUS | Status: AC
  Administered 2020-05-19: 400 mg via INTRAVENOUS
  Filled 2020-05-19: qty 200

## 2020-05-19 MED ORDER — DEXTROSE-NACL 5-0.45 % IV SOLN: INTRAVENOUS | Status: DC

## 2020-05-19 MED ORDER — ONDANSETRON HCL 4 MG/2ML IJ SOLN: 4.0000 mg | Freq: Four times a day (QID) | INTRAMUSCULAR | Status: DC | PRN

## 2020-05-19 MED ORDER — ROPIVACAINE HCL 2 MG/ML IJ SOLN
6.0000 mL/h | INTRAMUSCULAR | Status: DC
  Filled 2020-05-19 (×2): qty 200

## 2020-05-19 MED ORDER — FENTANYL CITRATE (PF) 250 MCG/5ML IJ SOLN
INTRAMUSCULAR | Status: AC
  Filled 2020-05-19: qty 5

## 2020-05-19 MED ORDER — NALOXONE HCL 0.4 MG/ML IJ SOLN
0.4000 mg | INTRAMUSCULAR | Status: DC | PRN
  Administered 2020-05-20: 0.4 mg via INTRAVENOUS
  Filled 2020-05-19: qty 1

## 2020-05-19 MED ORDER — PROMETHAZINE HCL 25 MG/ML IJ SOLN: 6.2500 mg | INTRAMUSCULAR | Status: DC | PRN

## 2020-05-19 MED ORDER — SODIUM BICARBONATE 8.4 % IV SOLN
INTRAVENOUS | Status: DC | PRN
  Administered 2020-05-19: 50 meq via INTRAVENOUS

## 2020-05-19 MED ORDER — WATER FOR IRRIGATION, STERILE IR SOLN
Status: DC | PRN
  Administered 2020-05-19: 1000 mL

## 2020-05-19 MED ORDER — FENTANYL CITRATE (PF) 100 MCG/2ML IJ SOLN: 25.0000 ug | INTRAMUSCULAR | Status: DC | PRN

## 2020-05-19 MED ORDER — PROPOFOL 10 MG/ML IV BOLUS
INTRAVENOUS | Status: AC
  Filled 2020-05-19: qty 20

## 2020-05-19 MED ORDER — PHENYLEPHRINE HCL-NACL 10-0.9 MG/250ML-% IV SOLN
INTRAVENOUS | Status: DC | PRN
  Administered 2020-05-19: 20 ug/min via INTRAVENOUS
  Administered 2020-05-19: 50 ug/min via INTRAVENOUS

## 2020-05-19 MED ORDER — CHLORHEXIDINE GLUCONATE CLOTH 2 % EX PADS
6.0000 | MEDICATED_PAD | Freq: Every day | CUTANEOUS | Status: DC
  Administered 2020-05-19 – 2020-05-22 (×3): 6 via TOPICAL

## 2020-05-19 MED ORDER — STERILE WATER FOR IRRIGATION IR SOLN
Status: DC | PRN
  Administered 2020-05-19: 1000 mL

## 2020-05-19 MED ORDER — DIPHENHYDRAMINE HCL 50 MG/ML IJ SOLN: 12.5000 mg | Freq: Four times a day (QID) | INTRAMUSCULAR | Status: DC | PRN

## 2020-05-19 MED ORDER — BUPIVACAINE HCL (PF) 0.25 % IJ SOLN
INTRAMUSCULAR | Status: DC | PRN
  Administered 2020-05-19: 20 mL

## 2020-05-19 MED ORDER — ROPIVACAINE HCL 2 MG/ML IJ SOLN
INTRAMUSCULAR | Status: DC | PRN
  Administered 2020-05-19: 6 mL/h via EPIDURAL

## 2020-05-19 MED ORDER — LIDOCAINE-EPINEPHRINE 1 %-1:100000 IJ SOLN
INTRAMUSCULAR | Status: DC | PRN
  Administered 2020-05-19: 20 mL

## 2020-05-19 MED ORDER — LIDOCAINE 2% (20 MG/ML) 5 ML SYRINGE
INTRAMUSCULAR | Status: AC
  Filled 2020-05-19: qty 5

## 2020-05-19 MED ORDER — ACETAMINOPHEN 500 MG PO TABS
1000.0000 mg | ORAL_TABLET | ORAL | Status: AC
  Administered 2020-05-19: 1000 mg via ORAL
  Filled 2020-05-19: qty 2

## 2020-05-19 MED ORDER — EPHEDRINE SULFATE-NACL 50-0.9 MG/10ML-% IV SOSY
PREFILLED_SYRINGE | INTRAVENOUS | Status: DC | PRN
  Administered 2020-05-19: 200 mg via INTRAVENOUS

## 2020-05-19 MED ORDER — FENTANYL CITRATE (PF) 250 MCG/5ML IJ SOLN
INTRAMUSCULAR | Status: AC
Start: 2020-05-19 — End: ?
  Filled 2020-05-19: qty 5

## 2020-05-19 MED ORDER — BUPIVACAINE HCL (PF) 0.25 % IJ SOLN
INTRAMUSCULAR | Status: AC
  Filled 2020-05-19: qty 30

## 2020-05-19 MED ORDER — PROPOFOL 10 MG/ML IV BOLUS
INTRAVENOUS | Status: DC | PRN
  Administered 2020-05-19: 30 mg via INTRAVENOUS
  Administered 2020-05-19: 120 mg via INTRAVENOUS

## 2020-05-19 MED ORDER — ROCURONIUM BROMIDE 10 MG/ML (PF) SYRINGE
PREFILLED_SYRINGE | INTRAVENOUS | Status: DC | PRN
  Administered 2020-05-19: 30 mg via INTRAVENOUS
  Administered 2020-05-19: 20 mg via INTRAVENOUS
  Administered 2020-05-19 (×2): 10 mg via INTRAVENOUS
  Administered 2020-05-19: 90 mg via INTRAVENOUS

## 2020-05-19 SURGICAL SUPPLY — 90 items
BAG BILE T-TUBES STRL (MISCELLANEOUS) ×8 IMPLANT
BIOPATCH RED 1 DISK 7.0 (GAUZE/BANDAGES/DRESSINGS) ×6 IMPLANT
BIOPATCH RED 1IN DISK 7.0MM (GAUZE/BANDAGES/DRESSINGS) ×2
BLADE SURG 10 STRL SS (BLADE) ×4 IMPLANT
BOOT SUTURE AID YELLOW STND (SUTURE) ×8 IMPLANT
CLIP VESOCCLUDE LG 6/CT (CLIP) ×4 IMPLANT
CLIP VESOCCLUDE MED 24/CT (CLIP) ×4 IMPLANT
CLIP VESOLOCK LG 6/CT PURPLE (CLIP) ×28 IMPLANT
CLIP VESOLOCK MED 6/CT (CLIP) ×4 IMPLANT
CLIP VESOLOCK MED LG 6/CT (CLIP) ×4 IMPLANT
CNTNR URN SCR LID CUP LEK RST (MISCELLANEOUS) ×4 IMPLANT
CONT SPEC 4OZ STRL OR WHT (MISCELLANEOUS) ×4
COVER MAYO STAND STRL (DRAPES) ×4 IMPLANT
COVER SURGICAL LIGHT HANDLE (MISCELLANEOUS) ×4 IMPLANT
DRAIN CHANNEL 19F RND (DRAIN) ×8 IMPLANT
DRAIN PENROSE 0.5X18 (DRAIN) ×4 IMPLANT
DRAIN PENROSE 1/2X12 LTX STRL (WOUND CARE) ×4 IMPLANT
DRAPE WARM FLUID 44X44 (DRAPES) ×4 IMPLANT
DRSG COVADERM 4X14 (GAUZE/BANDAGES/DRESSINGS) ×4 IMPLANT
DRSG COVADERM PLUS 2X2 (GAUZE/BANDAGES/DRESSINGS) ×4 IMPLANT
DRSG TEGADERM 4X4.75 (GAUZE/BANDAGES/DRESSINGS) ×4 IMPLANT
DURAPREP 26ML APPLICATOR (WOUND CARE) ×4 IMPLANT
ELECT BLADE 4.0 EZ CLEAN MEGAD (MISCELLANEOUS) ×4
ELECT BLADE 6.5 EXT (BLADE) ×4 IMPLANT
ELECT CAUTERY BLADE 6.4 (BLADE) ×4 IMPLANT
ELECT REM PT RETURN 9FT ADLT (ELECTROSURGICAL) ×4
ELECTRODE BLDE 4.0 EZ CLN MEGD (MISCELLANEOUS) ×2 IMPLANT
ELECTRODE REM PT RTRN 9FT ADLT (ELECTROSURGICAL) ×2 IMPLANT
GLOVE BIO SURGEON STRL SZ 6 (GLOVE) ×36 IMPLANT
GLOVE BIO SURGEON STRL SZ 6.5 (GLOVE) ×9 IMPLANT
GLOVE BIO SURGEONS STRL SZ 6.5 (GLOVE) ×3
GLOVE BIOGEL PI IND STRL 6.5 (GLOVE) ×14 IMPLANT
GLOVE BIOGEL PI IND STRL 7.5 (GLOVE) ×2 IMPLANT
GLOVE BIOGEL PI INDICATOR 6.5 (GLOVE) ×14
GLOVE BIOGEL PI INDICATOR 7.5 (GLOVE) ×2
GLOVE ECLIPSE 6.5 STRL STRAW (GLOVE) ×4 IMPLANT
GLOVE ECLIPSE 7.5 STRL STRAW (GLOVE) ×4 IMPLANT
GLOVE INDICATOR 6.5 STRL GRN (GLOVE) ×8 IMPLANT
GOWN STRL REUS W/ TWL LRG LVL3 (GOWN DISPOSABLE) ×12 IMPLANT
GOWN STRL REUS W/TWL 2XL LVL3 (GOWN DISPOSABLE) ×8 IMPLANT
GOWN STRL REUS W/TWL LRG LVL3 (GOWN DISPOSABLE) ×16 IMPLANT
HANDLE SUCTION POOLE (INSTRUMENTS) ×2 IMPLANT
KIT BASIN OR (CUSTOM PROCEDURE TRAY) ×4 IMPLANT
KIT MARKER MARGIN INK (KITS) ×4 IMPLANT
KIT TUBE JEJUNAL 16FR (CATHETERS) IMPLANT
KIT TURNOVER KIT B (KITS) ×4 IMPLANT
L-HOOK LAP DISP 36CM (ELECTROSURGICAL) ×4
LHOOK LAP DISP 36CM (ELECTROSURGICAL) ×2 IMPLANT
LOOP VESSEL MAXI BLUE (MISCELLANEOUS) ×4 IMPLANT
LOOP VESSEL MINI RED (MISCELLANEOUS) ×4 IMPLANT
NEEDLE 22X1 1/2 (OR ONLY) (NEEDLE) ×4 IMPLANT
NS IRRIG 1000ML POUR BTL (IV SOLUTION) ×16 IMPLANT
PAD ARMBOARD 7.5X6 YLW CONV (MISCELLANEOUS) ×8 IMPLANT
PENCIL SMOKE EVACUATOR (MISCELLANEOUS) ×4 IMPLANT
RELOAD PROXIMATE 75MM BLUE (ENDOMECHANICALS) ×12 IMPLANT
SET TUBE SMOKE EVAC HIGH FLOW (TUBING) ×4 IMPLANT
SHEARS FOC LG CVD HARMONIC 17C (MISCELLANEOUS) ×4 IMPLANT
SLEEVE ENDOPATH XCEL 5M (ENDOMECHANICALS) ×4 IMPLANT
SLEEVE SUCTION 125 (MISCELLANEOUS) ×4 IMPLANT
SLEEVE SUCTION CATH 165 (SLEEVE) ×4 IMPLANT
SPONGE LAP 18X18 RF (DISPOSABLE) ×28 IMPLANT
STAPLER PROXIMATE 75MM BLUE (STAPLE) ×4 IMPLANT
STAPLER VISISTAT 35W (STAPLE) ×4 IMPLANT
SUCTION POOLE HANDLE (INSTRUMENTS) ×4
SUT 5.0 PDS RB-1 (SUTURE) ×10
SUT ETHILON 2 0 FS 18 (SUTURE) ×16 IMPLANT
SUT PDS AB 1 TP1 96 (SUTURE) ×8 IMPLANT
SUT PDS AB 3-0 SH 27 (SUTURE) ×20 IMPLANT
SUT PDS AB 4-0 RB1 27 (SUTURE) ×48 IMPLANT
SUT PDS PLUS AB 5-0 RB-1 (SUTURE) ×10 IMPLANT
SUT PROLENE 3 0 SH 48 (SUTURE) ×16 IMPLANT
SUT PROLENE 4 0 RB 1 (SUTURE) ×16
SUT PROLENE 4-0 RB1 .5 CRCL 36 (SUTURE) ×16 IMPLANT
SUT PROLENE 5 0 RB 1 DA (SUTURE) ×8 IMPLANT
SUT SILK 2 0 SH CR/8 (SUTURE) ×12 IMPLANT
SUT SILK 2 0 TIES 10X30 (SUTURE) ×4 IMPLANT
SUT SILK 3 0 SH CR/8 (SUTURE) ×4 IMPLANT
SUT SILK 3 0 TIES 10X30 (SUTURE) ×4 IMPLANT
SUT VIC AB 2-0 SH 18 (SUTURE) ×4 IMPLANT
SUT VIC AB 3-0 SH 27 (SUTURE) ×2
SUT VIC AB 3-0 SH 27X BRD (SUTURE) ×2 IMPLANT
TOWEL GREEN STERILE (TOWEL DISPOSABLE) ×4 IMPLANT
TRAY FOLEY MTR SLVR 14FR STAT (SET/KITS/TRAYS/PACK) ×4 IMPLANT
TRAY LAPAROSCOPIC MC (CUSTOM PROCEDURE TRAY) ×4 IMPLANT
TROCAR XCEL NON-BLD 5MMX100MML (ENDOMECHANICALS) ×4 IMPLANT
TUBE CONNECTING 12'X1/4 (SUCTIONS) ×3
TUBE CONNECTING 12X1/4 (SUCTIONS) ×9 IMPLANT
TUBE FEEDING ENTERAL 5FR 16IN (TUBING) ×4 IMPLANT
WATER STERILE IRR 1000ML POUR (IV SOLUTION) ×4 IMPLANT
YANKAUER SUCT BULB TIP NO VENT (SUCTIONS) ×8 IMPLANT

## 2020-05-19 NOTE — Interval H&P Note (Signed)
History and Physical Interval Note:  06/01/2020 7:38 AM  Mary [name]  has presented today for surgery, with the diagnosis of pancreatic cancer.  The various methods of treatment have been discussed with the patient and family. After consideration of risks, benefits and other options for treatment, the patient has consented to  Procedure(s): LAPAROSCOPY DIAGNOSTIC (N/A) WHIPPLE PROCEDURE (N/A) JEJUNOSTOMY TUBE PLACEMENT (N/A) as a surgical intervention.  The patient's history has been reviewed, patient examined, no change in status, stable for surgery.  I have reviewed the patient's chart and labs.  Questions were answered to the patient's satisfaction.     Stark Klein

## 2020-05-19 NOTE — Anesthesia Procedure Notes (Signed)
Procedure Name: Intubation Date/Time: 06/12/2020 7:53 AM Performed by: Darletta Moll, CRNA Pre-anesthesia Checklist: Patient identified, Emergency Drugs available, Suction available, Patient being monitored and Timeout performed Oxygen Delivery Method: Circle system utilized Preoxygenation: Pre-oxygenation with 100% oxygen Induction Type: IV induction Ventilation: Mask ventilation without difficulty Laryngoscope Size: Miller and 2 Grade View: Grade I Tube type: Oral Tube size: 7.0 mm Number of attempts: 1 Airway Equipment and Method: Stylet Placement Confirmation: ETT inserted through vocal cords under direct vision,  breath sounds checked- equal and bilateral and CO2 detector Secured at: 21 cm Tube secured with: Tape Dental Injury: Teeth and Oropharynx as per pre-operative assessment  Comments: Placed by Loleta Books SRNA

## 2020-05-19 NOTE — Transfer of Care (Signed)
Immediate Anesthesia Transfer of Care Note  [name]  Procedure(s) Performed: LAPAROSCOPY DIAGNOSTIC (N/A Abdomen) WHIPPLE PROCEDURE (N/A Abdomen)  Patient Location: PACU  Anesthesia Type:General  Level of Consciousness: drowsy and patient cooperative  Airway & Oxygen Therapy: Patient Spontanous Breathing and Patient connected to face mask oxygen  Post-op Assessment: Report given to RN, Post -op Vital signs reviewed and stable and Patient moving all extremities X 4  Post vital signs: Reviewed and stable  Last Vitals:  Vitals Value Taken Time  BP 159/77 05/28/2020 1605  Temp 36.6 C 06/03/2020 1605  Pulse 78 06/15/2020 1610  Resp 19 05/18/2020 1610  SpO2 100 % 05/29/2020 1610  Vitals shown include unvalidated device data.  Last Pain:  Vitals:   06/13/2020 0618  TempSrc: Oral         Complications: No complications documented.

## 2020-05-19 NOTE — Anesthesia Procedure Notes (Signed)
Arterial Line Insertion Start/End09/13/2021 6:50 AM, 05/19/2020 6:55 AM  Patient location: Pre-op. Preanesthetic checklist: patient identified, IV checked, site marked, risks and benefits discussed, surgical consent, monitors and equipment checked, pre-op evaluation and timeout performed Lidocaine 1% used for infiltration Left, radial was placed Catheter size: 20 G Hand hygiene performed , maximum sterile barriers used  and Seldinger technique used Allen's test indicative of satisfactory collateral circulation Attempts: 1 Procedure performed without using ultrasound guided technique. Ultrasound Notes:anatomy identified Following insertion, dressing applied and Biopatch. Post procedure assessment: normal  Patient tolerated the procedure well with no immediate complications. Additional procedure comments: Placed by Hilda Blades.

## 2020-05-19 NOTE — Anesthesia Procedure Notes (Signed)
Epidural Patient location during procedure: pre-op Start time: 06/09/2020 6:55 AM End time: 06/02/2020 7:02 AM  Staffing Anesthesiologist: Merlinda Frederick, MD Performed: anesthesiologist   Preanesthetic Checklist Completed: patient identified, IV checked, site marked, risks and benefits discussed, surgical consent, monitors and equipment checked, pre-op evaluation and timeout performed  Epidural Patient position: sitting Prep: ChloraPrep Patient monitoring: heart rate, continuous pulse ox and blood pressure Approach: midline Location: thoracic (1-12) (T7) Injection technique: LOR saline  Needle:  Needle type: Tuohy  Needle gauge: 17 G Needle length: 9 cm Needle insertion depth: 5 cm Catheter type: closed end flexible Catheter size: 20 Guage Catheter at skin depth: 10 cm Test dose: negative

## 2020-05-19 NOTE — Op Note (Signed)
PREOPERATIVE DIAGNOSIS: adenocarcinoma of the pancreatic head, cT2N0M0  POSTOPERATIVE DIAGNOSIS: Same.   PROCEDURES PERFORMED:  Diagnostic laparoscopy  Classic pancreaticoduodenectomy   Placement of pancreatic duct stent   SURGEON: Stark Klein, MD   ASSISTANT: Reece Leader, MD; PGY 7   ANESTHESIA: General and epidural   FINDINGS: 2 cm pancreatic head mass. Firm pancreatic tissue. 15 mm common bile duct. 2 mm pancreatic duct  SPECIMENS:  1. Pancreaticoduodenectomy with gallbladder:  2. Portal nodes  3.  Omentum 4.  Additional pancreatic margin.    ESTIMATED BLOOD LOSS: 800 mL.   COMPLICATIONS: None known.   PROCEDURE:   Pt was identified in the holding area and taken to  the operating room, and placed supine on the operating room  table. General anesthesia was induced. The patient's abdomen was  prepped and draped in a sterile fashion, after a Foley catheter was  placed. A time-out was performed according to the surgical safety check  list. When all was correct we continued.   The patient was placed in reverse trendelenburg position and rotated to the right.  The left subcostal margin was anesthetized with local anesthesia.  A 5 mm optiview trocar was placed under direct visualization.  The abdomen was insufflated with carbon dioxide.  The abdomen was examined.  A second port was placed in the upper midline to be able to better visualize the right liver.  No evidence of carcinomatosis was seen.    A midline incision was made from the xiphoid to just above the umbilicus. The subcutaneous tissues were divided with the Bovie cautery. The peritoneum was entered in the center of the abdomen. Digital retraction was then used to elevate the preperitoneal fat, and  this was taken with the cautery as well. Care was taken to protect the underlying viscera.   Bookwalter self-retaining retractor was placed for visualization. The right colon was taken down off of the white line of Toldt  and from the retroperitoneum at the hepatic flexure. The porta was identified. The  duodenum was kocherized extensively with blunt dissection and with cautery. The gallbladder was taken off the liver with a combination of blunt dissection and cautery. The cystic duct was clipped with the Hemalock clips. The cystic duct was divided and the gallbladder was passed off.   The common bile duct was skeletonized near the duodenum. A vessel loop was passed around it. The gastroduodenal artery, as well as the common hepatic artery were skeletonized. The proper hepatic artery was traced out to make sure that flow was going to both sides of the liver when the GDA was clamped. The GDA was test clamped with the bulldog, but when this occurred, there was no good flow into the liver.   This was dissected more caudally.  There was still not good flow into the liver.  A Kelly clamp was passed underneath the pancreas at the superior mesenteric vein, and this passed  Almost to the top of the pancreas.    Attention was then directed to the stomach, and the omentum was taken  off of the stomach at the border of the antrum and the body. The  gastrohepatic ligament was taken down with the harmonic, and care was  taken to make sure there was not a replaced left hepatic artery in this  location. The stomach was divided with the GIA-75 stapler. The border  of the stomach was oversewn with a 3-0 running PDS suture.   Attention was then directed to the small bowel. Around  10 cm past the  ligament of Treitz was located, and this was divided with the 75-GIA.  The distal portion of the jejunum was also oversewn with a 3-0 PDS  suture. The fourth portion of the duodenum was skeletonized with the  harmonic scalpel, taking down all of the mesenteric vessels. The  ligament of Treitz was taken down. The IMV was preserved.  The duodenum was then passed underneath the portal vein.   At this point the Claiborne Billings was replaced and the  pancreas was divided with the cautery. 2-0 silk sutures were tied down and the inferior and superior border of the pancreas. The Bovie was used to coagulate the small bleeders at the border of the pancreas.  The Overholt in combination with the harmonic and locking Weck clips  were then used to take the uncinate process off of the portal vein and  the superior mesenteric artery. There was significant bleeding at the anterior portal vein and from a lateral branch of the portal vein.  A cooley clamp was used to control the bleeding, and this was repaired with a 4-0 PDS. Care was taken not to incorporate the superior mesenteric artery in the dissection. The specimen was then marked and passed off the table for frozen section margin.   The appropriate location for the choledochojejunostomy was identified, and  the small bowel was opened approximately 15 mm. The anastamosis was created with approximately twelve 4-0 interrupted PDS sutures.   The 2 corner sutures were placed first  and then the posterior layer was done in an interrupted fashion tying on  the inside. The superior layer was then closed with interrupted sutures as  well.   At this point the frozens returned and the bile duct was clear, but the pancreatic duct was focally positive. Additional pancreatic duct was taken. The pancreatic anastomosis was then created. The pancreas was firm, and the duct was 2 mm. A pediatric feeding tube was used as a pancreatic stent. The posterior layer was formed first with 2-0 silk sutures in interrupted fashion. Three 5-0 pds were used to create an anastamosis.   The anterior layer was then oversewn with 2-0 silks to dunk the pancreatic parenchyma.   The more distal portion of the jejunum was pulled up over  the colon, and two 3-0 silks were placed through the posterior border  of the stomach for the gastrojejunostomy. The stomach and the small  bowel were opened, and a GIA-75 was used to create an end-to-end   anastomosis. The open areas of the staple line were examined to ensure  that there was hemostasis. The defect was then closed with a single  layer of running Connell suture of 3-0 PDS. Prior to a complete  closure, the NG tube was passed toward the afferent limb.  The areas were then irrigated and then those anastomoses were covered  with Vistaseal. This was allowed to dry.   The abdomen was then irrigated  again and all the laparotomy sponges were removed. A lap count was  performed, which was correct. Two 19-Blake drains were placed, with the  lateral-most drain placed behind the choledochojejunostomy. The medial  Blake drain was placed just anterior and slightly superior to the  pancreaticojejunostomy.  The fascia was then closed with #1 looped running PDS sutures. The skin was irrigated and then closed with  staples. The wounds were cleaned, dried and dressed with a sterile  dressing.   The patient tolerated the procedure well and was extubated and  taken to  PACU in stable condition. Needle and sponge counts were correct x2.

## 2020-05-20 ENCOUNTER — Inpatient Hospital Stay (HOSPITAL_COMMUNITY): Payer: Medicare Other

## 2020-05-20 ENCOUNTER — Encounter (HOSPITAL_COMMUNITY): Payer: Self-pay | Admitting: Anesthesiology

## 2020-05-20 ENCOUNTER — Encounter (HOSPITAL_COMMUNITY): Payer: Self-pay | Admitting: General Surgery

## 2020-05-20 DIAGNOSIS — C250 Malignant neoplasm of head of pancreas: Principal | ICD-10-CM

## 2020-05-20 DIAGNOSIS — J9601 Acute respiratory failure with hypoxia: Secondary | ICD-10-CM

## 2020-05-20 DIAGNOSIS — N179 Acute kidney failure, unspecified: Secondary | ICD-10-CM

## 2020-05-20 DIAGNOSIS — R4189 Other symptoms and signs involving cognitive functions and awareness: Secondary | ICD-10-CM

## 2020-05-20 DIAGNOSIS — R579 Shock, unspecified: Secondary | ICD-10-CM

## 2020-05-20 LAB — POCT I-STAT 7, (LYTES, BLD GAS, ICA,H+H)
Acid-base deficit: 24 mmol/L — ABNORMAL HIGH (ref 0.0–2.0)
Acid-base deficit: 17 mmol/L — ABNORMAL HIGH (ref 0.0–2.0)
Acid-base deficit: 4 mmol/L — ABNORMAL HIGH (ref 0.0–2.0)
Bicarbonate: 5.6 mmol/L — ABNORMAL LOW (ref 20.0–28.0)
Bicarbonate: 10.7 mmol/L — ABNORMAL LOW (ref 20.0–28.0)
Bicarbonate: 18.5 mmol/L — ABNORMAL LOW (ref 20.0–28.0)
Calcium, Ion: 0.91 mmol/L — ABNORMAL LOW (ref 1.15–1.40)
Calcium, Ion: 0.91 mmol/L — ABNORMAL LOW (ref 1.15–1.40)
Calcium, Ion: 0.86 mmol/L — CL (ref 1.15–1.40)
HCT: 32 % — ABNORMAL LOW (ref 36.0–46.0)
HCT: 32 % — ABNORMAL LOW (ref 36.0–46.0)
HCT: 32 % — ABNORMAL LOW (ref 36.0–46.0)
Hemoglobin: 10.9 g/dL — ABNORMAL LOW (ref 12.0–15.0)
Hemoglobin: 10.9 g/dL — ABNORMAL LOW (ref 12.0–15.0)
Hemoglobin: 10.9 g/dL — ABNORMAL LOW (ref 12.0–15.0)
O2 Saturation: 94 %
O2 Saturation: 100 %
O2 Saturation: 97 %
Patient temperature: 93
Patient temperature: 96.9
Patient temperature: 98
Potassium: 4.5 mmol/L (ref 3.5–5.1)
Potassium: 5 mmol/L (ref 3.5–5.1)
Potassium: 4.6 mmol/L (ref 3.5–5.1)
Sodium: 140 mmol/L (ref 135–145)
Sodium: 141 mmol/L (ref 135–145)
Sodium: 139 mmol/L (ref 135–145)
TCO2: 6 mmol/L — ABNORMAL LOW (ref 22–32)
TCO2: 12 mmol/L — ABNORMAL LOW (ref 22–32)
TCO2: 19 mmol/L — ABNORMAL LOW (ref 22–32)
pCO2 arterial: 19.1 mmHg — CL (ref 32.0–48.0)
pCO2 arterial: 28.2 mmHg — ABNORMAL LOW (ref 32.0–48.0)
pCO2 arterial: 26.1 mmHg — ABNORMAL LOW (ref 32.0–48.0)
pH, Arterial: 7.056 — CL (ref 7.350–7.450)
pH, Arterial: 7.18 — CL (ref 7.350–7.450)
pH, Arterial: 7.458 — ABNORMAL HIGH (ref 7.350–7.450)
pO2, Arterial: 82 mmHg — ABNORMAL LOW (ref 83.0–108.0)
pO2, Arterial: 331 mmHg — ABNORMAL HIGH (ref 83.0–108.0)
pO2, Arterial: 85 mmHg (ref 83.0–108.0)

## 2020-05-20 LAB — GLUCOSE, CAPILLARY
Glucose-Capillary: 290 mg/dL — ABNORMAL HIGH (ref 70–99)
Glucose-Capillary: 271 mg/dL — ABNORMAL HIGH (ref 70–99)
Glucose-Capillary: 208 mg/dL — ABNORMAL HIGH (ref 70–99)
Glucose-Capillary: 179 mg/dL — ABNORMAL HIGH (ref 70–99)
Glucose-Capillary: 171 mg/dL — ABNORMAL HIGH (ref 70–99)
Glucose-Capillary: 163 mg/dL — ABNORMAL HIGH (ref 70–99)
Glucose-Capillary: 167 mg/dL — ABNORMAL HIGH (ref 70–99)
Glucose-Capillary: 135 mg/dL — ABNORMAL HIGH (ref 70–99)
Glucose-Capillary: 143 mg/dL — ABNORMAL HIGH (ref 70–99)
Glucose-Capillary: 157 mg/dL — ABNORMAL HIGH (ref 70–99)
Glucose-Capillary: 155 mg/dL — ABNORMAL HIGH (ref 70–99)
Glucose-Capillary: 150 mg/dL — ABNORMAL HIGH (ref 70–99)
Glucose-Capillary: 150 mg/dL — ABNORMAL HIGH (ref 70–99)

## 2020-05-20 LAB — AMMONIA: Ammonia: 88 umol/L — ABNORMAL HIGH (ref 9–35)

## 2020-05-20 LAB — DIC (DISSEMINATED INTRAVASCULAR COAGULATION)PANEL
D-Dimer, Quant: >20 ug/mL-FEU — ABNORMAL HIGH (ref 0.00–0.50)
Fibrinogen: 204 mg/dL — ABNORMAL LOW (ref 210–475)
INR: 3.6 — ABNORMAL HIGH (ref 0.8–1.2)
Platelets: 330 10*3/uL (ref 150–400)
Prothrombin Time: 34.4 seconds — ABNORMAL HIGH (ref 11.4–15.2)
Smear Review: NONE SEEN
aPTT: 76 seconds — ABNORMAL HIGH (ref 24–36)

## 2020-05-20 LAB — COMPREHENSIVE METABOLIC PANEL
ALT: 440 U/L — ABNORMAL HIGH (ref 0–44)
ALT: 570 U/L — ABNORMAL HIGH (ref 0–44)
ALT: 940 U/L — ABNORMAL HIGH (ref 0–44)
AST: 684 U/L — ABNORMAL HIGH (ref 15–41)
AST: 1060 U/L — ABNORMAL HIGH (ref 15–41)
AST: 1969 U/L — ABNORMAL HIGH (ref 15–41)
Albumin: 2.6 g/dL — ABNORMAL LOW (ref 3.5–5.0)
Albumin: 2.3 g/dL — ABNORMAL LOW (ref 3.5–5.0)
Albumin: 3.6 g/dL (ref 3.5–5.0)
Alkaline Phosphatase: 132 U/L — ABNORMAL HIGH (ref 38–126)
Alkaline Phosphatase: 179 U/L — ABNORMAL HIGH (ref 38–126)
Alkaline Phosphatase: 246 U/L — ABNORMAL HIGH (ref 38–126)
Anion gap: 31 — ABNORMAL HIGH (ref 5–15)
Anion gap: 25 — ABNORMAL HIGH (ref 5–15)
BUN: 8 mg/dL (ref 8–23)
BUN: 8 mg/dL (ref 8–23)
BUN: 7 mg/dL — ABNORMAL LOW (ref 8–23)
CO2: <7 mmol/L — ABNORMAL LOW (ref 22–32)
CO2: 9 mmol/L — ABNORMAL LOW (ref 22–32)
CO2: 15 mmol/L — ABNORMAL LOW (ref 22–32)
Calcium: 7.8 mg/dL — ABNORMAL LOW (ref 8.9–10.3)
Calcium: 7.9 mg/dL — ABNORMAL LOW (ref 8.9–10.3)
Calcium: 8.2 mg/dL — ABNORMAL LOW (ref 8.9–10.3)
Chloride: 106 mmol/L (ref 98–111)
Chloride: 104 mmol/L (ref 98–111)
Chloride: 100 mmol/L (ref 98–111)
Creatinine, Ser: 2.3 mg/dL — ABNORMAL HIGH (ref 0.44–1.00)
Creatinine, Ser: 2.5 mg/dL — ABNORMAL HIGH (ref 0.44–1.00)
Creatinine, Ser: 1.91 mg/dL — ABNORMAL HIGH (ref 0.44–1.00)
GFR calc Af Amer: 24 mL/min — ABNORMAL LOW (ref >60)
GFR calc Af Amer: 22 mL/min — ABNORMAL LOW (ref >60)
GFR calc Af Amer: 31 mL/min — ABNORMAL LOW (ref >60)
GFR calc non Af Amer: 21 mL/min — ABNORMAL LOW (ref >60)
GFR calc non Af Amer: 19 mL/min — ABNORMAL LOW (ref >60)
GFR calc non Af Amer: 26 mL/min — ABNORMAL LOW (ref >60)
Glucose, Bld: 336 mg/dL — ABNORMAL HIGH (ref 70–99)
Glucose, Bld: 280 mg/dL — ABNORMAL HIGH (ref 70–99)
Glucose, Bld: 180 mg/dL — ABNORMAL HIGH (ref 70–99)
Potassium: 4.9 mmol/L (ref 3.5–5.1)
Potassium: 5 mmol/L (ref 3.5–5.1)
Potassium: 5 mmol/L (ref 3.5–5.1)
Sodium: 141 mmol/L (ref 135–145)
Sodium: 144 mmol/L (ref 135–145)
Sodium: 140 mmol/L (ref 135–145)
Total Bilirubin: 5.6 mg/dL — ABNORMAL HIGH (ref 0.3–1.2)
Total Bilirubin: 5.7 mg/dL — ABNORMAL HIGH (ref 0.3–1.2)
Total Bilirubin: 8.2 mg/dL — ABNORMAL HIGH (ref 0.3–1.2)
Total Protein: 4.1 g/dL — ABNORMAL LOW (ref 6.5–8.1)
Total Protein: 4 g/dL — ABNORMAL LOW (ref 6.5–8.1)
Total Protein: 5.2 g/dL — ABNORMAL LOW (ref 6.5–8.1)

## 2020-05-20 LAB — BPAM FFP
Blood Product Expiration Date: 202109062359
ISSUE DATE / TIME: 202109021355
Unit Type and Rh: 6200

## 2020-05-20 LAB — URINALYSIS, COMPLETE (UACMP) WITH MICROSCOPIC
Bilirubin Urine: NEGATIVE
Glucose, UA: >=500 mg/dL — AB
Ketones, ur: NEGATIVE mg/dL
Nitrite: NEGATIVE
Protein, ur: 100 mg/dL — AB
Specific Gravity, Urine: 1.012 (ref 1.005–1.030)
pH: 5 (ref 5.0–8.0)

## 2020-05-20 LAB — PREPARE FRESH FROZEN PLASMA: Unit division: 0

## 2020-05-20 LAB — RENAL FUNCTION PANEL
Albumin: 3.5 g/dL (ref 3.5–5.0)
Anion gap: 24 — ABNORMAL HIGH (ref 5–15)
BUN: 8 mg/dL (ref 8–23)
CO2: 15 mmol/L — ABNORMAL LOW (ref 22–32)
Calcium: 8.2 mg/dL — ABNORMAL LOW (ref 8.9–10.3)
Chloride: 99 mmol/L (ref 98–111)
Creatinine, Ser: 1.97 mg/dL — ABNORMAL HIGH (ref 0.44–1.00)
GFR calc Af Amer: 30 mL/min — ABNORMAL LOW (ref >60)
GFR calc non Af Amer: 25 mL/min — ABNORMAL LOW (ref >60)
Glucose, Bld: 178 mg/dL — ABNORMAL HIGH (ref 70–99)
Phosphorus: 4.1 mg/dL (ref 2.5–4.6)
Potassium: 5 mmol/L (ref 3.5–5.1)
Sodium: 138 mmol/L (ref 135–145)

## 2020-05-20 LAB — CBC
HCT: 39.1 % (ref 36.0–46.0)
HCT: 40.1 % (ref 36.0–46.0)
Hemoglobin: 11.9 g/dL — ABNORMAL LOW (ref 12.0–15.0)
Hemoglobin: 12.6 g/dL (ref 12.0–15.0)
MCH: 30.2 pg (ref 26.0–34.0)
MCH: 31.2 pg (ref 26.0–34.0)
MCHC: 30.4 g/dL (ref 30.0–36.0)
MCHC: 31.4 g/dL (ref 30.0–36.0)
MCV: 99.2 fL (ref 80.0–100.0)
MCV: 99.3 fL (ref 80.0–100.0)
Platelets: 325 10*3/uL (ref 150–400)
Platelets: 315 10*3/uL (ref 150–400)
RBC: 3.94 MIL/uL (ref 3.87–5.11)
RBC: 4.04 MIL/uL (ref 3.87–5.11)
RDW: 19 % — ABNORMAL HIGH (ref 11.5–15.5)
RDW: 18.2 % — ABNORMAL HIGH (ref 11.5–15.5)
WBC: 25.9 10*3/uL — ABNORMAL HIGH (ref 4.0–10.5)
WBC: 25.3 10*3/uL — ABNORMAL HIGH (ref 4.0–10.5)
nRBC: 0.1 % (ref 0.0–0.2)
nRBC: 0 % (ref 0.0–0.2)

## 2020-05-20 LAB — PROTIME-INR
INR: 3.5 — ABNORMAL HIGH (ref 0.8–1.2)
Prothrombin Time: 33.7 seconds — ABNORMAL HIGH (ref 11.4–15.2)

## 2020-05-20 LAB — LACTIC ACID, PLASMA
Lactic Acid, Venous: >11 mmol/L (ref 0.5–1.9)
Lactic Acid, Venous: >11 mmol/L (ref 0.5–1.9)

## 2020-05-20 LAB — TRIGLYCERIDES: Triglycerides: 289 mg/dL — ABNORMAL HIGH (ref <150)

## 2020-05-20 LAB — BETA-HYDROXYBUTYRIC ACID: Beta-Hydroxybutyric Acid: 0.15 mmol/L (ref 0.05–0.27)

## 2020-05-20 LAB — PHOSPHORUS: Phosphorus: 8.3 mg/dL — ABNORMAL HIGH (ref 2.5–4.6)

## 2020-05-20 LAB — MAGNESIUM: Magnesium: 1.8 mg/dL (ref 1.7–2.4)

## 2020-05-20 LAB — SODIUM, URINE, RANDOM: Sodium, Ur: 50 mmol/L

## 2020-05-20 LAB — PREPARE RBC (CROSSMATCH)

## 2020-05-20 LAB — CREATININE, URINE, RANDOM: Creatinine, Urine: 68.29 mg/dL

## 2020-05-20 MED ORDER — CALCIUM GLUCONATE-NACL 2-0.675 GM/100ML-% IV SOLN
2.0000 g | Freq: Once | INTRAVENOUS | Status: AC
  Administered 2020-05-20: 2000 mg via INTRAVENOUS
  Filled 2020-05-20: qty 100

## 2020-05-20 MED ORDER — DEXTROSE 50 % IV SOLN: 0.0000 mL | INTRAVENOUS | Status: DC | PRN

## 2020-05-20 MED ORDER — LACTATED RINGERS IV BOLUS
20.0000 mL/kg | Freq: Once | INTRAVENOUS | Status: AC
  Administered 2020-05-20: 1154 mL via INTRAVENOUS

## 2020-05-20 MED ORDER — INSULIN ASPART 100 UNIT/ML ~~LOC~~ SOLN: 0.0000 [IU] | SUBCUTANEOUS | Status: DC

## 2020-05-20 MED ORDER — SODIUM BICARBONATE 8.4 % IV SOLN
INTRAVENOUS | Status: AC
  Administered 2020-05-20: 50 meq
  Filled 2020-05-20: qty 100

## 2020-05-20 MED ORDER — SODIUM CHLORIDE 0.9 % IV SOLN: INTRAVENOUS | Status: DC | PRN

## 2020-05-20 MED ORDER — SODIUM CHLORIDE 0.9 % IV SOLN: INTRAVENOUS | Status: DC

## 2020-05-20 MED ORDER — FENTANYL CITRATE (PF) 100 MCG/2ML IJ SOLN: 25.0000 ug | INTRAMUSCULAR | Status: DC | PRN

## 2020-05-20 MED ORDER — HEPARIN SODIUM (PORCINE) 1000 UNIT/ML DIALYSIS
1000.0000 [IU] | INTRAMUSCULAR | Status: DC | PRN
  Filled 2020-05-20: qty 6

## 2020-05-20 MED ORDER — NOREPINEPHRINE 4 MG/250ML-% IV SOLN: 0.0000 ug/min | INTRAVENOUS | Status: DC

## 2020-05-20 MED ORDER — SODIUM CHLORIDE 0.9% IV SOLUTION: Freq: Once | INTRAVENOUS | Status: AC

## 2020-05-20 MED ORDER — INSULIN REGULAR(HUMAN) IN NACL 100-0.9 UT/100ML-% IV SOLN
INTRAVENOUS | Status: DC
  Administered 2020-05-20: 1.6 [IU]/h via INTRAVENOUS
  Filled 2020-05-20: qty 100

## 2020-05-20 MED ORDER — STERILE WATER FOR INJECTION IV SOLN: INTRAVENOUS | Status: DC

## 2020-05-20 MED ORDER — CALCIUM CHLORIDE 10 % IV SOLN
INTRAVENOUS | Status: AC
  Filled 2020-05-20: qty 20

## 2020-05-20 MED ORDER — NOREPINEPHRINE 4 MG/250ML-% IV SOLN
0.0000 ug/min | INTRAVENOUS | Status: DC
  Administered 2020-05-20: 10 ug/min via INTRAVENOUS
  Administered 2020-05-21: 17 ug/min via INTRAVENOUS
  Administered 2020-05-21: 6 ug/min via INTRAVENOUS
  Administered 2020-05-22: 30 ug/min via INTRAVENOUS
  Filled 2020-05-20 (×3): qty 250

## 2020-05-20 MED ORDER — ORAL CARE MOUTH RINSE
15.0000 mL | OROMUCOSAL | Status: DC
  Administered 2020-05-20 – 2020-05-22 (×20): 15 mL via OROMUCOSAL

## 2020-05-20 MED ORDER — LACTATED RINGERS IV BOLUS
2000.0000 mL | Freq: Once | INTRAVENOUS | Status: AC
  Administered 2020-05-20: 2000 mL via INTRAVENOUS

## 2020-05-20 MED ORDER — PROPOFOL 10 MG/ML IV BOLUS: 50.0000 mg | Freq: Once | INTRAVENOUS | Status: AC

## 2020-05-20 MED ORDER — STERILE WATER FOR INJECTION IV SOLN
INTRAVENOUS | Status: DC
  Filled 2020-05-20 (×7): qty 150

## 2020-05-20 MED ORDER — SODIUM CHLORIDE 0.9 % FOR CRRT: INTRAVENOUS_CENTRAL | Status: DC | PRN

## 2020-05-20 MED ORDER — PHENYLEPHRINE HCL-NACL 10-0.9 MG/250ML-% IV SOLN
0.0000 ug/min | INTRAVENOUS | Status: DC
  Administered 2020-05-20: 50 ug/min via INTRAVENOUS

## 2020-05-20 MED ORDER — STERILE WATER FOR INJECTION IV SOLN
INTRAVENOUS | Status: DC
  Filled 2020-05-20: qty 850

## 2020-05-20 MED ORDER — PANTOPRAZOLE SODIUM 40 MG IV SOLR
40.0000 mg | Freq: Every day | INTRAVENOUS | Status: DC
  Administered 2020-05-20 – 2020-05-22 (×3): 40 mg via INTRAVENOUS
  Filled 2020-05-20 (×3): qty 40

## 2020-05-20 MED ORDER — FENTANYL 2500MCG IN NS 250ML (10MCG/ML) PREMIX INFUSION
0.0000 ug/h | INTRAVENOUS | Status: DC
  Administered 2020-05-20: 50 ug/h via INTRAVENOUS
  Filled 2020-05-20: qty 250

## 2020-05-20 MED ORDER — PROPOFOL 1000 MG/100ML IV EMUL
5.0000 ug/kg/min | INTRAVENOUS | Status: DC
  Administered 2020-05-20 (×2): 25 ug/kg/min via INTRAVENOUS
  Filled 2020-05-20: qty 100

## 2020-05-20 MED ORDER — FENTANYL CITRATE (PF) 100 MCG/2ML IJ SOLN
25.0000 ug | INTRAMUSCULAR | Status: DC | PRN
  Administered 2020-05-20: 25 ug via INTRAVENOUS
  Filled 2020-05-20: qty 2

## 2020-05-20 MED ORDER — STERILE WATER FOR INJECTION IV SOLN
INTRAVENOUS | Status: DC
  Filled 2020-05-20 (×6): qty 150

## 2020-05-20 MED ORDER — METRONIDAZOLE IN NACL 5-0.79 MG/ML-% IV SOLN: 500.0000 mg | Freq: Three times a day (TID) | INTRAVENOUS | Status: DC

## 2020-05-20 MED ORDER — ROCURONIUM BROMIDE 50 MG/5ML IV SOLN
60.0000 mg | Freq: Once | INTRAVENOUS | Status: AC
  Administered 2020-05-20: 60 mg via INTRAVENOUS

## 2020-05-20 MED ORDER — FENTANYL CITRATE (PF) 100 MCG/2ML IJ SOLN
INTRAMUSCULAR | Status: AC
Start: 2020-05-20 — End: 2020-05-20
  Filled 2020-05-20: qty 2

## 2020-05-20 MED ORDER — SODIUM BICARBONATE 8.4 % IV SOLN: 50.0000 meq | Freq: Once | INTRAVENOUS | Status: DC

## 2020-05-20 MED ORDER — INSULIN REGULAR(HUMAN) IN NACL 100-0.9 UT/100ML-% IV SOLN: INTRAVENOUS | Status: DC

## 2020-05-20 MED ORDER — PRISMASOL BGK 4/2.5 32-4-2.5 MEQ/L IV SOLN
INTRAVENOUS | Status: DC
  Filled 2020-05-20 (×29): qty 5000

## 2020-05-20 MED ORDER — ALBUMIN HUMAN 5 % IV SOLN
12.5000 g | Freq: Four times a day (QID) | INTRAVENOUS | Status: DC
  Administered 2020-05-20 – 2020-05-21 (×4): 12.5 g via INTRAVENOUS
  Filled 2020-05-20 (×5): qty 250

## 2020-05-20 MED ORDER — METRONIDAZOLE IN NACL 5-0.79 MG/ML-% IV SOLN
500.0000 mg | Freq: Three times a day (TID) | INTRAVENOUS | Status: DC
  Administered 2020-05-20 – 2020-05-22 (×6): 500 mg via INTRAVENOUS
  Filled 2020-05-20 (×6): qty 100

## 2020-05-20 MED ORDER — LACTATED RINGERS IV SOLN: INTRAVENOUS | Status: DC

## 2020-05-20 MED ORDER — DEXTROSE IN LACTATED RINGERS 5 % IV SOLN: INTRAVENOUS | Status: DC

## 2020-05-20 MED ORDER — PHENYLEPHRINE HCL-NACL 10-0.9 MG/250ML-% IV SOLN
INTRAVENOUS | Status: AC
  Filled 2020-05-20: qty 250

## 2020-05-20 MED ORDER — NOREPINEPHRINE 4 MG/250ML-% IV SOLN
INTRAVENOUS | Status: AC
  Filled 2020-05-20: qty 250

## 2020-05-20 MED ORDER — MIDAZOLAM HCL 2 MG/2ML IJ SOLN
INTRAMUSCULAR | Status: AC
  Filled 2020-05-20: qty 2

## 2020-05-20 MED ORDER — POTASSIUM CHLORIDE 10 MEQ/100ML IV SOLN
10.0000 meq | INTRAVENOUS | Status: AC
  Administered 2020-05-20 (×2): 10 meq via INTRAVENOUS
  Filled 2020-05-20 (×2): qty 100

## 2020-05-20 MED ORDER — SODIUM CHLORIDE 0.9 % IV SOLN
2.0000 g | Freq: Two times a day (BID) | INTRAVENOUS | Status: DC
  Administered 2020-05-20 – 2020-05-22 (×5): 2 g via INTRAVENOUS
  Filled 2020-05-20 (×7): qty 2

## 2020-05-20 MED ORDER — INSULIN ASPART 100 UNIT/ML ~~LOC~~ SOLN
0.0000 [IU] | Freq: Three times a day (TID) | SUBCUTANEOUS | Status: DC
  Administered 2020-05-20: 5 [IU] via SUBCUTANEOUS

## 2020-05-20 MED ORDER — PROPOFOL 1000 MG/100ML IV EMUL
INTRAVENOUS | Status: AC
  Administered 2020-05-20: 50 mg via INTRAVENOUS
  Filled 2020-05-20: qty 100

## 2020-05-20 NOTE — Procedures (Signed)
Arterial Catheter Insertion Procedure Note  TOWANA STENGLEIN  [id number]  Dec 03, 1950  Date:05/20/20  Time:6:23 AM    Provider Performing: Glena Norfolk C    Procedure: Insertion of Arterial Line [phone]) without US guidance  Indication(s) Blood pressure monitoring and/or need for frequent ABGs  Consent Unable to obtain consent due to emergent nature of procedure.  Anesthesia None   Time Out Verified patient identification, verified procedure, site/side was marked, verified correct patient position, special equipment/implants available, medications/allergies/relevant history reviewed, required imaging and test results available.   Sterile Technique Maximal sterile technique including full sterile barrier drape, hand hygiene, sterile gown, sterile gloves, mask, hair covering, sterile ultrasound probe cover (if used).   Procedure Description Area of catheter insertion was cleaned with chlorhexidine and draped in sterile fashion. Without real-time ultrasound guidance an arterial catheter was placed into the right radial artery.  Appropriate arterial tracings confirmed on monitor.     Complications/Tolerance None; patient tolerated the procedure well.   EBL Minimal   Specimen(s) None

## 2020-05-20 NOTE — Progress Notes (Signed)
PT Cancellation Note  Patient Details Name: Mary [name] MRN: [id number] DOB: [birth year]   Cancelled Treatment:    Reason Eval/Treat Not Completed: Patient not medically ready Per nursing, pt not ready for PT evaluation. Known to have episode of decreased responsiveness 9/3. Work up pending. Will follow.   [name] 05/20/2020, 7:31 AM [name], PT, DPT Acute Rehabilitation Services Pager [phone] Office [phone]

## 2020-05-20 NOTE — Progress Notes (Signed)
CRITICAL VALUE ALERT  Critical Value:  Lactic Acid > 11  Date & Time Notied:  05/20/20 1126  Provider Notified: CCM  Orders Received/Actions taken: Albumin infusing    Hiram Gash RN

## 2020-05-20 NOTE — Progress Notes (Signed)
1 Day Post-Op diagnostic laparoscopy, whipple procedure and omentectomy  Subjective: Patient had acute change in mental status around midnight, more somnolent. Narcan given with no response, CT head done, which was normal. Marked acidosis, STAT (pH 7.05, bicarb 5.6, base deficit 24). Given an additional fluid bolus, H/H stable. Epidural paused due to ongoing hypotension. UOP 130cc. Drains serosanguinous, medial drain 350cc, lateral drain 140cc.   Objective: Vital signs in last 24 hours: Temp:  [93.4 F (34.1 C)-97.8 F (36.6 C)] 93.6 F (34.2 C) (09/03 0630) Pulse Rate:  [78-111] 102 (09/03 0700) Resp:  [15-35] 23 (09/03 0629) BP: (80-159)/(47-77) 106/57 (09/03 0700) SpO2:  [95 %-100 %] 98 % (09/03 0700) Arterial Line BP: (63-177)/(44-111) 96/44 (09/03 0700)    Intake/Output from previous day: 09/02 0701 - 09/03 0700 In: 6772.8 [I.V.:4123.8; Blood:1777; IV Piggyback:850] Out: 5427 [Urine:375; Drains:490; Blood:900] Intake/Output this shift: No intake/output data recorded.  Physical exam:  Gen: Somnolent, but follows commands Heart: Tachycardic Lungs: CTAB Abd: Soft, appropriately tender to palpation, midline incision with surgical dre Ext: No edema  Lab Results:   BMET Recent Labs    06/07/2020 1717 05/21/2020 1717 06/15/2020 1750 05/20/20 0602  NA 135   < > 139 140  K 4.5   < > 4.3 4.5  CL 103  --   --   --   CO2 14*  --   --   --   GLUCOSE 327*  --   --   --   BUN 10  --   --   --   CREATININE 1.34*  --   --   --   CALCIUM 7.9*  --   --   --    < > = values in this interval not displayed.   PT/INR Recent Labs    05/20/20 0513  LABPROT 33.7*  INR 3.5*   ABG Recent Labs    06/01/2020 1750 05/20/20 0602  PHART 7.208* 7.056*  HCO3 13.1* 5.6*    Studies/Results: DG Abd 1 View  Result Date: 06/01/2020 CLINICAL DATA:  NG tube placement EXAM: ABDOMEN - 1 VIEW COMPARISON:  CT 04/19/2020, 03/31/2020 FINDINGS: Interval postsurgical changes of the abdomen including  left abdominal cutaneous staples and left upper quadrant drainage catheters. Partially visualized vascular catheter at the low right atrium. Atelectasis at the left base. Esophageal tube tip projects over the left lower abdomen. Linear opacity in the left upper quadrant with oblique right to left orientation presumably a stent. IMPRESSION: Esophageal tube tip projects over the left lower abdomen, presumably within small bowel limb in this patient with recent history of pancreatico duodenectomy. Electronically Signed   By: Donavan Foil M.D.   On: 05/31/2020 17:25   CT HEAD CODE STROKE WO CONTRAST  Result Date: 05/20/2020 CLINICAL DATA:  Code stroke.  Decreased responsiveness EXAM: CT HEAD WITHOUT CONTRAST TECHNIQUE: Contiguous axial images were obtained from the base of the skull through the vertex without intravenous contrast. COMPARISON:  None. FINDINGS: Brain: No evidence of acute infarction, hemorrhage, hydrocephalus, extra-axial collection or mass lesion/mass effect. Low-density below the right basal ganglia attributed to dilated perivascular space. Vascular: No hyperdense vessel or unexpected calcification. Skull: Normal. Negative for fracture or focal lesion. Sinuses/Orbits: No acute finding. Other: These results were communicated to Dr. Leonel Ramsay at 5:31 amon 9/3/2021by text page via the [facility] messaging system. ASPECTS [facility] Stroke Program Early CT Score) Not scored in this clinical setting IMPRESSION: Negative head CT. Electronically Signed   By: Monte Fantasia M.D.   On:  05/20/2020 05:32    Anti-infectives: Anti-infectives (From admission, onward)   Start     Dose/Rate Route Frequency Ordered Stop   05/18/2020 0630  ciprofloxacin (CIPRO) IVPB 400 mg        400 mg 200 mL/hr over 60 Minutes Intravenous On call to O.R. 05/23/2020 2258 06/16/2020 0810      Assessment/Plan: 55F s/p Procedure(s): diagnostic laparoscopy, whipple procedure and omentectomy for pancreatic cancer  LOS: 1 day    -Consult critical care -give an additional 5% albumin bolus now, ordered 25% albumin q6h for 8 doses -continue fluid resuscitation -for now will stop IV tylenol, will follow up LFTs this morning -continue with NGT and NPO -will continue to hold epidural given hypotension, likely secondary to hypovolemia  -keep foley to closely follow [place] discussed with attending Dr. Jake Michaelis 05/20/2020

## 2020-05-20 NOTE — Progress Notes (Signed)
Pharmacy Antibiotic Note  Mary [name] is a 69 y.o. female admitted on 06/13/2020 with sepsis.  Pharmacy has been consulted for Cefepime dosing. WBC elevated at 25. Hypothermic. Starting CRRT today in the setting of AKI and severe metabolic acidosis.   Plan: -Start Cefepime 2 gm IV Q 12 hours -Flagyl 500 mg IV Q 8 hours per MD -Monitor CBC, cultures and clinical progress   Height: 5\' 1"  (154.9 cm) Weight: 57.7 kg (127 lb 3.3 oz) IBW/kg (Calculated) : 47.8  Temp (24hrs), Avg:95.6 F (35.3 C), Min:93.4 F (34.1 C), Max:97.8 F (36.6 C)  Recent Labs  Lab 05/16/20 1315 06/05/2020 1717 05/20/20 0513  WBC 5.4  --  25.9*  CREATININE 1.23* 1.34* 2.30*    Estimated Creatinine Clearance: 19.1 mL/min (A) (by C-G formula based on SCr of 2.3 mg/dL (H)).    Allergies  Allergen Reactions  . Ultram [Tramadol] Nausea And Vomiting  . Penicillins Rash    Did it involve swelling of the face/tongue/throat, SOB, or low BP? No Did it involve sudden or severe rash/hives, skin peeling, or any reaction on the inside of your mouth or nose? No Did you need to seek medical attention at a hospital or doctor's office? No When did it last happen?20 + years If all above answers are "NO", may proceed with cephalosporin use.   . Sulfa Antibiotics Rash    Antimicrobials this admission: Cefepime 9/3 >>  Flagyl 9/3 >>   Dose adjustments this admission:   Microbiology results: 9/2 MRSA PCR neg  9/3 UCx >> 9/3 BCx >>   Thank you for allowing pharmacy to be a part of this patient's care.  Albertina Parr, PharmD., BCPS, BCCCP Clinical Pharmacist Clinical phone for 05/20/20 until 3:30pm: [phone] If after 3:30pm, please refer to [facility] for unit-specific pharmacist

## 2020-05-20 NOTE — Consult Note (Signed)
[facility] Renal Consultation Note  Requesting MD:  Indication for Consultation: Acute kidney injury, maintenance of euvolemia, treatment assessment of acid-base disorder, treatment assessment electrolyte disorder.  HPI:  Mary [name] is a 69 y.o. female.  With baseline serum creatinine appears to be within normal range.  She has a history of cardiac disease with an ASD at age 46..  She presented for surgery for pancreatic cancer and underwent a Whipple's procedure 05/30/2020.  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  Postprocedure she had decreased level of consciousness and code stroke was activated 05/20/2020.  Her labs revealed severe acidosis.  She has very minimal urine output and has been aggressively volume resuscitated since 06/13/2020 with about 8 L.  There has been some hypotension with blood pressures as low as 80/50 1 AM 05/20/2020.  Blood pressure 127/58 pulse 107 temperature 95.1 O2 sats 94% room air.  Very minimal urine output  Sodium 140 potassium 4.5 chloride 106 CO2 less than 7 glucose 336 creatinine 2.3 phosphorus 8.3 magnesium 1.8 albumin 2.6.  pH 7.056   IV phenylephrine IV sodium bicarbonate 125 cc an hour  Home medications aspirin 81 mg daily losartan 50 mg daily Remeron 7.5 mg daily,  Creatinine  Date/Time Value Ref Range Status  04/29/2020 07:48 AM 1.12 (H) 0.44 - 1.00 mg/dL Final  04/15/2020 08:55 AM 1.14 (H) 0.44 - 1.00 mg/dL Final  04/01/2020 09:10 AM 1.09 (H) 0.44 - 1.00 mg/dL Final  03/18/2020 02:10 PM 0.97 0.44 - 1.00 mg/dL Final  03/03/2020 09:55 AM 1.01 (H) 0.44 - 1.00 mg/dL Final  02/19/2020 11:50 AM 0.92 0.44 - 1.00 mg/dL Final  02/04/2020 10:14 AM 0.94 0.44 - 1.00 mg/dL Final  01/21/2020 09:20 AM 1.03 (H) 0.44 - 1.00 mg/dL Final  01/07/2020 11:53 AM 0.96 0.44 - 1.00 mg/dL Final  12/29/2019 11:02 AM 1.00 0.44 - 1.00 mg/dL Final  12/24/2019 04:19 PM 0.95 0.44 - 1.00 mg/dL Final   Creatinine, Ser   Date/Time Value Ref Range Status  05/20/2020 05:13 AM 2.30 (H) 0.44 - 1.00 mg/dL Final  05/27/2020 05:17 PM 1.34 (H) 0.44 - 1.00 mg/dL Final  05/16/2020 01:15 PM 1.23 (H) 0.44 - 1.00 mg/dL Final  12/11/2019 08:54 AM 1.77 (H) 0.44 - 1.00 mg/dL Final     PMHx:   Past Medical History:  Diagnosis Date  . Abdominal distention 12/07/2019  . 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 Anxiety    per patient  . Asthma    "adult" asthma  . Atrial septal defect, sinus venosus 12/07/2019  . Common bile duct dilation 12/07/2019  . Complication of anesthesia    had trouble waking up on her first colonoscopy  . Dilated pancreatic duct 12/07/2019  . Dysrhythmia    palpitatations  . Elevated lipase 12/07/2019  . Essential hypertension 12/07/2019  . Family history of breast cancer   . History of kidney stones    on xray only  . Hypothyroidism 12/07/2019   "being watched" not on medications at this time  . Loss of weight 12/07/2019  . Nausea 12/07/2019  . pancreatic ca 11/2019  . Pancreatic mass 12/07/2019  . Pneumonia   . Pure hypercholesterolemia 12/07/2019    Past Surgical History:  Procedure Laterality Date  . ASD REPAIR, SINUS VENOSUS     at age 80   . BILIARY STENT PLACEMENT  12/11/2019   Procedure: BILIARY STENT PLACEMENT;  Surgeon: [name], MD;  Location: [facility] ENDOSCOPY;  Service: Endoscopy;;  .  COLONOSCOPY    . ENDOSCOPIC RETROGRADE CHOLANGIOPANCREATOGRAPHY (ERCP) WITH PROPOFOL N/A 12/11/2019   Procedure: ENDOSCOPIC RETROGRADE CHOLANGIOPANCREATOGRAPHY (ERCP) WITH PROPOFOL;  Surgeon: [name], MD;  Location: [facility] ENDOSCOPY;  Service: Endoscopy;  Laterality: N/A;  . ESOPHAGOGASTRODUODENOSCOPY (EGD) WITH PROPOFOL N/A 12/11/2019   Procedure: ESOPHAGOGASTRODUODENOSCOPY (EGD) WITH PROPOFOL;  Surgeon: [name], MD;  Location: [facility] ENDOSCOPY;  Service: Endoscopy;  Laterality: N/A;  . FINE NEEDLE ASPIRATION  12/11/2019   Procedure: FINE NEEDLE ASPIRATION (FNA) LINEAR;  Surgeon: [name],  MD;  Location: [facility];  Service: Endoscopy;;  . IR IMAGING GUIDED PORT INSERTION  01/04/2020  . LAPAROSCOPY N/A 06/10/2020   Procedure: LAPAROSCOPY DIAGNOSTIC;  Surgeon: Stark Klein, MD;  Location: [facility];  Service: General;  Laterality: N/A;  . TONSILLECTOMY    . TRANSTHORACIC ECHOCARDIOGRAM     08/18/19 ([facility]): Normal LV/RV size and LVF/RVF, EF 55%. Borderline LV wall thickness. Normal PASP 26 mmHg. Normal cardiac valves. No residual shunt. No evidence of RV overload or enlargement.   . TUBAL LIGATION    . UPPER ESOPHAGEAL ENDOSCOPIC ULTRASOUND (EUS) N/A 12/11/2019   Procedure: UPPER ESOPHAGEAL ENDOSCOPIC ULTRASOUND (EUS);  Surgeon: [name], MD;  Location: [facility] ENDOSCOPY;  Service: Endoscopy;  Laterality: N/A;  . WHIPPLE PROCEDURE N/A 06/02/2020   Procedure: WHIPPLE PROCEDURE;  Surgeon: Stark Klein, MD;  Location: [facility] OR;  Service: General;  Laterality: N/A;    Family Hx:  Family History  Problem Relation Age of Onset  . COPD Mother   . Dementia Father   . Diabetes Father   . Hypertension Father   . Alzheimer's disease Brother   . Breast cancer Paternal Aunt        dx. in her early 78s  . Diabetes Paternal Aunt   . Diabetes Paternal Uncle   . Other Maternal Grandfather        hospitalized for rectal bleeding before death  . Dementia Paternal Grandfather   . Dementia Paternal Uncle     Social History:  reports that she has never smoked. She has never used smokeless tobacco. She reports that she does not drink alcohol and does not use drugs.  Allergies:  Allergies  Allergen Reactions  . Ultram [Tramadol] Nausea And Vomiting  . Penicillins Rash    Did it involve swelling of the face/tongue/throat, SOB, or low BP? No Did it involve sudden or severe rash/hives, skin peeling, or any reaction on the inside of your mouth or nose? No Did you need to seek medical attention at a hospital or doctor's office? No When did it last happen?20 + years If all above  answers are "NO", may proceed with cephalosporin use.   . Sulfa Antibiotics Rash    Medications: Prior to Admission medications   Medication Sig Start Date End Date Taking? Authorizing Provider  acetaminophen (TYLENOL) 500 MG tablet Take 500-1,000 mg by mouth every 6 (six) hours as needed for moderate pain or headache.    Yes [provider]  albuterol (VENTOLIN HFA) 108 (90 Base) MCG/ACT inhaler Inhale 2 puffs into the lungs every 4 (four) hours as needed for wheezing or shortness of breath.  08/18/19  Yes [provider]  aspirin EC 81 MG tablet Take 81 mg by mouth at bedtime.    Yes [provider]  dicyclomine (BENTYL) 10 MG capsule TAKE 1 CAPSULE BY MOUTH TWICE A DAY AS NEEDED FOR SPASMS Patient taking differently: Take 10 mg by mouth 2 (two) times daily as needed (  abdominal spasms).  02/09/20  Yes Alla Feeling, NP  Homeopathic Products (ARNICA EX) Apply 1 application topically 2 (two) times daily as needed (foot burning). Amish Foot Cream (blend of botanical oils Tea Tree/Peppermint/Arnica)    Yes [provider]  HYDROcodone-acetaminophen (NORCO/VICODIN) 5-325 MG tablet Take 1 tablet by mouth every 6 (six) hours as needed for moderate pain. 12/15/19  Yes Alla Feeling, NP  lidocaine-prilocaine (EMLA) cream Apply 1 application topically as needed. Patient taking differently: Apply 1 application topically as needed (prior to port being accessed.).  12/31/19  Yes Ladell Pier, MD  LORazepam (ATIVAN) 0.5 MG tablet Take 1 tablet (0.5 mg total) by mouth every 12 (twelve) hours as needed for anxiety. Do not drive while taking Patient taking differently: Take 0.5 mg by mouth every 12 (twelve) hours as needed for anxiety or sleep. Do not drive while taking 6/64/40  Yes Ladell Pier, MD  losartan (COZAAR) 50 MG tablet Take 50 mg by mouth at bedtime.  11/29/19  Yes [provider]  mirtazapine (REMERON) 7.5 MG tablet TAKE 1 TABLET BY MOUTH EVERYDAY  AT BEDTIME Patient taking differently: Take 7.5 mg by mouth at bedtime as needed (sleep.).  04/06/20  Yes Ladell Pier, MD  ondansetron (ZOFRAN-ODT) 8 MG disintegrating tablet Take 1 tablet (8 mg total) by mouth every 8 (eight) hours as needed for nausea or vomiting. 12/29/19  Yes Ladell Pier, MD  oxymetazoline (VICKS SINEX) 0.05 % nasal spray Place 1 spray into both nostrils 2 (two) times daily as needed for congestion.    Yes [provider]  polyethylene glycol (MIRALAX / GLYCOLAX) 17 g packet Take 17 g by mouth daily as needed (constipation.).    Yes [provider]  prochlorperazine (COMPAZINE) 10 MG tablet Take 1 tablet (10 mg total) by mouth every 6 (six) hours as needed for nausea or vomiting. Patient taking differently: Take 10 mg by mouth every 6 (six) hours as needed for nausea or vomiting. Nausea/vomiting 12/31/19  Yes Ladell Pier, MD  senna-docusate (SENOKOT-S) 8.6-50 MG tablet Take 1 tablet by mouth 2 (two) times daily as needed for mild constipation.   Yes [provider]     Labs:  Results for orders placed or performed during the hospital encounter of 06/15/2020 (from the past 48 hour(s))  ABO/Rh     Status: None   Collection Time: 06/04/2020  6:26 AM  Result Value Ref Range   ABO/RH(D)      O NEG Performed at [place] [address], [place], [place] [address]   Prepare RBC (crossmatch)     Status: None   Collection Time: 06/10/2020  8:35 AM  Result Value Ref Range   Order Confirmation      ORDER PROCESSED BY BLOOD BANK Performed at [facility], [place] [address] [place], [place] [address]   I-STAT 7, (LYTES, BLD GAS, ICA, H+H)     Status: Abnormal   Collection Time: 06/02/2020  8:59 AM  Result Value Ref Range   pH, Arterial 7.415 7.35 - 7.45   pCO2 arterial 37.3 32 - 48 mmHg   pO2, Arterial 198 (H) 83 - 108 mmHg   Bicarbonate 24.3 20.0 - 28.0 mmol/L   TCO2 26 22 - 32 mmol/L   O2 Saturation 100.0 %   Acid-base deficit  1.0 0.0 - 2.0 mmol/L   Sodium 139 135 - 145 mmol/L   Potassium 3.3 (L) 3.5 - 5.1 mmol/L  Calcium, Ion 1.18 1.15 - 1.40 mmol/L   HCT 25.0 (L) 36 - 46 %   Hemoglobin 8.5 (L) 12.0 - 15.0 g/dL   Patient temperature 35.4 C    Sample type ARTERIAL   I-STAT 7, (LYTES, BLD GAS, ICA, H+H)     Status: Abnormal   Collection Time: 05/20/2020 10:41 AM  Result Value Ref Range   pH, Arterial 7.391 7.35 - 7.45   pCO2 arterial 40.1 32 - 48 mmHg   pO2, Arterial 217 (H) 83 - 108 mmHg   Bicarbonate 24.6 20.0 - 28.0 mmol/L   TCO2 26 22 - 32 mmol/L   O2 Saturation 100.0 %   Acid-base deficit 1.0 0.0 - 2.0 mmol/L   Sodium 138 135 - 145 mmol/L   Potassium 3.8 3.5 - 5.1 mmol/L   Calcium, Ion 1.19 1.15 - 1.40 mmol/L   HCT 24.0 (L) 36 - 46 %   Hemoglobin 8.2 (L) 12.0 - 15.0 g/dL   Patient temperature 35.8 C    Sample type ARTERIAL   I-STAT 7, (LYTES, BLD GAS, ICA, H+H)     Status: Abnormal   Collection Time: 05/18/2020 12:31 PM  Result Value Ref Range   pH, Arterial 7.390 7.35 - 7.45   pCO2 arterial 36.9 32 - 48 mmHg   pO2, Arterial 223 (H) 83 - 108 mmHg   Bicarbonate 22.3 20.0 - 28.0 mmol/L   TCO2 23 22 - 32 mmol/L   O2 Saturation 100.0 %   Acid-base deficit 2.0 0.0 - 2.0 mmol/L   Sodium 139 135 - 145 mmol/L   Potassium 4.0 3.5 - 5.1 mmol/L   Calcium, Ion 1.12 (L) 1.15 - 1.40 mmol/L   HCT 22.0 (L) 36 - 46 %   Hemoglobin 7.5 (L) 12.0 - 15.0 g/dL   Sample type ARTERIAL   Prepare fresh frozen plasma     Status: None   Collection Time: 05/24/2020  1:46 PM  Result Value Ref Range   Unit Number [id number]    Blood Component Type THW PLS APHR    Unit division 00    Status of Unit ISSUED,FINAL    Transfusion Status      OK TO TRANSFUSE Performed at [facility], [address], [place], [place] [address]   I-STAT 7, (LYTES, BLD GAS, ICA, H+H)     Status: Abnormal   Collection Time: 05/20/2020  3:07 PM  Result Value Ref Range   pH, Arterial 7.247 (L) 7.35 - 7.45   pCO2 arterial 36.3 32 - 48  mmHg   pO2, Arterial 243 (H) 83 - 108 mmHg   Bicarbonate 15.9 (L) 20.0 - 28.0 mmol/L   TCO2 17 (L) 22 - 32 mmol/L   O2 Saturation 100.0 %   Acid-base deficit 11.0 (H) 0.0 - 2.0 mmol/L   Sodium 139 135 - 145 mmol/L   Potassium 4.7 3.5 - 5.1 mmol/L   Calcium, Ion 0.81 (LL) 1.15 - 1.40 mmol/L   HCT 35.0 (L) 36 - 46 %   Hemoglobin 11.9 (L) 12.0 - 15.0 g/dL   Patient temperature 36.4 C    Sample type ARTERIAL   I-STAT 7, (LYTES, BLD GAS, ICA, H+H)     Status: Abnormal   Collection Time: 06/05/2020  3:49 PM  Result Value Ref Range   pH, Arterial 7.215 (L) 7.35 - 7.45   pCO2 arterial 39.9 32 - 48 mmHg   pO2, Arterial 508 (H) 83 - 108 mmHg   Bicarbonate 16.1 (L) 20.0 - 28.0  mmol/L   TCO2 17 (L) 22 - 32 mmol/L   O2 Saturation 100.0 %   Acid-base deficit 11.0 (H) 0.0 - 2.0 mmol/L   Sodium 138 135 - 145 mmol/L   Potassium 4.6 3.5 - 5.1 mmol/L   Calcium, Ion 0.87 (LL) 1.15 - 1.40 mmol/L   HCT 37.0 36 - 46 %   Hemoglobin 12.6 12.0 - 15.0 g/dL   Sample type ARTERIAL   Comprehensive metabolic panel     Status: Abnormal   Collection Time: 06/09/2020  5:17 PM  Result Value Ref Range   Sodium 135 135 - 145 mmol/L   Potassium 4.5 3.5 - 5.1 mmol/L   Chloride 103 98 - 111 mmol/L   CO2 14 (L) 22 - 32 mmol/L   Glucose, Bld 327 (H) 70 - 99 mg/dL    Comment: Glucose reference range applies only to samples taken after fasting for at least 8 hours.   BUN 10 8 - 23 mg/dL   Creatinine, Ser 1.34 (H) 0.44 - 1.00 mg/dL   Calcium 7.9 (L) 8.9 - 10.3 mg/dL   Total Protein 5.3 (L) 6.5 - 8.1 g/dL   Albumin 3.3 (L) 3.5 - 5.0 g/dL   AST 133 (H) 15 - 41 U/L   ALT 70 (H) 0 - 44 U/L   Alkaline Phosphatase 97 38 - 126 U/L   Total Bilirubin 3.0 (H) 0.3 - 1.2 mg/dL   GFR calc non Af Amer 41 (L) >60 mL/min   GFR calc Af Amer 47 (L) >60 mL/min   Anion gap 18 (H) 5 - 15    Comment: Performed at [facility], [place] [address], [place], [place] [address]  I-STAT 7, (LYTES, BLD GAS, ICA, H+H)     Status: Abnormal    Collection Time: 06/03/2020  5:50 PM  Result Value Ref Range   pH, Arterial 7.208 (L) 7.35 - 7.45   pCO2 arterial 32.8 32 - 48 mmHg   pO2, Arterial 82 (L) 83 - 108 mmHg   Bicarbonate 13.1 (L) 20.0 - 28.0 mmol/L   TCO2 14 (L) 22 - 32 mmol/L   O2 Saturation 94.0 %   Acid-base deficit 14.0 (H) 0.0 - 2.0 mmol/L   Sodium 139 135 - 145 mmol/L   Potassium 4.3 3.5 - 5.1 mmol/L   Calcium, Ion 0.98 (L) 1.15 - 1.40 mmol/L   HCT 41.0 36 - 46 %   Hemoglobin 13.9 12.0 - 15.0 g/dL   Patient temperature 97.8 F    Sample type ARTERIAL   MRSA PCR Screening     Status: None   Collection Time: 06/11/2020  7:36 PM   Specimen: Nasopharyngeal  Result Value Ref Range   MRSA by PCR NEGATIVE NEGATIVE    Comment:        The GeneXpert MRSA Assay (FDA approved for NASAL specimens only), is one component of a comprehensive MRSA colonization surveillance program. It is not intended to diagnose MRSA infection nor to guide or monitor treatment for MRSA infections. Performed at [facility], [place] [address] [place], [place] [address]   Prepare RBC (crossmatch)     Status: None   Collection Time: 05/20/20  5:03 AM  Result Value Ref Range   Order Confirmation      ORDER PROCESSED BY BLOOD BANK BB SAMPLE OR UNITS ALREADY AVAILABLE Performed at [facility], [place] [address], [place], [place] [address]   CBC     Status: Abnormal   Collection Time: 05/20/20  5:13 AM  Result Value Ref Range   WBC 25.9 (H) 4.0 - 10.5 K/uL   RBC 3.94 3.87 - 5.11 MIL/uL   Hemoglobin 11.9 (L) 12.0 - 15.0 g/dL   HCT 39.1 36 - 46 %   MCV 99.2 80.0 - 100.0 fL   MCH 30.2 26.0 - 34.0 pg   MCHC 30.4 30.0 - 36.0 g/dL   RDW 19.0 (H) 11.5 - 15.5 %   Platelets 325 150 - 400 K/uL   nRBC 0.1 0.0 - 0.2 %    Comment: Performed at [facility] [address], [place], [place] [address]  Comprehensive metabolic panel     Status: Abnormal   Collection Time: 05/20/20  5:13 AM  Result Value Ref Range   Sodium 141 135 - 145  mmol/L   Potassium 4.9 3.5 - 5.1 mmol/L   Chloride 106 98 - 111 mmol/L   CO2 <7 (L) 22 - 32 mmol/L   Glucose, Bld 336 (H) 70 - 99 mg/dL    Comment: Glucose reference range applies only to samples taken after fasting for at least 8 hours.   BUN 8 8 - 23 mg/dL   Creatinine, Ser 2.30 (H) 0.44 - 1.00 mg/dL   Calcium 7.8 (L) 8.9 - 10.3 mg/dL   Total Protein 4.1 (L) 6.5 - 8.1 g/dL   Albumin 2.6 (L) 3.5 - 5.0 g/dL   AST 684 (H) 15 - 41 U/L   ALT 440 (H) 0 - 44 U/L   Alkaline Phosphatase 132 (H) 38 - 126 U/L   Total Bilirubin 5.6 (H) 0.3 - 1.2 mg/dL   GFR calc non Af Amer 21 (L) >60 mL/min   GFR calc Af Amer 24 (L) >60 mL/min   Anion gap NOT CALCULATED 5 - 15    Comment: Performed at [facility], [place] [address] [place], [place] [address]  Magnesium     Status: None   Collection Time: 05/20/20  5:13 AM  Result Value Ref Range   Magnesium 1.8 1.7 - 2.4 mg/dL    Comment: Performed at [facility] [address] [place], [place] [address]  Phosphorus     Status: Abnormal   Collection Time: 05/20/20  5:13 AM  Result Value Ref Range   Phosphorus 8.3 (H) 2.5 - 4.6 mg/dL    Comment: Performed at [facility] [address], [place], [place] [address]  Protime-INR     Status: Abnormal   Collection Time: 05/20/20  5:13 AM  Result Value Ref Range   Prothrombin Time 33.7 (H) 11.4 - 15.2 seconds   INR 3.5 (H) 0.8 - 1.2    Comment: (NOTE) INR goal varies based on device and disease states. Performed at [facility], [place] [address], [place], [place] [address]   I-STAT 7, (LYTES, BLD GAS, ICA, H+H)     Status: Abnormal   Collection Time: 05/20/20  6:02 AM  Result Value Ref Range   pH, Arterial 7.056 (LL) 7.35 - 7.45   pCO2 arterial 19.1 (LL) 32 - 48 mmHg   pO2, Arterial 82 (L) 83 - 108 mmHg   Bicarbonate 5.6 (L) 20.0 - 28.0 mmol/L   TCO2 6 (L) 22 - 32 mmol/L   O2 Saturation 94.0 %   Acid-base deficit 24.0 (H) 0.0 - 2.0 mmol/L   Sodium 140 135 - 145 mmol/L    Potassium 4.5 3.5 - 5.1 mmol/L   Calcium, Ion 0.91 (L) 1.15 - 1.40  mmol/L   HCT 32.0 (L) 36 - 46 %   Hemoglobin 10.9 (L) 12.0 - 15.0 g/dL   Patient temperature 93.0 F    Collection site [employer] features editor by Operator    Sample type ARTERIAL   Glucose, capillary     Status: Abnormal   Collection Time: 05/20/20  6:53 AM  Result Value Ref Range   Glucose-Capillary 290 (H) 70 - 99 mg/dL    Comment: Glucose reference range applies only to samples taken after fasting for at least 8 hours.  Glucose, capillary     Status: Abnormal   Collection Time: 05/20/20  8:28 AM  Result Value Ref Range   Glucose-Capillary 271 (H) 70 - 99 mg/dL    Comment: Glucose reference range applies only to samples taken after fasting for at least 8 hours.     ROS:  Patient critically ill full review of systems per HPI  Physical Exam: Vitals:   05/20/20 0832 05/20/20 0843  BP:    Pulse:  (!) 103  Resp: (!) 23 (!) 24  Temp:  (!) 95.1 F (35.1 C)  SpO2: 97% 96%     General: Critically ill elderly patient HEENT: Normocephalic atraumatic mucous membranes moist reactive Eyes: Pupils round equal reactive no icterus or pallor Neck: Supple no thyromegaly adenopathy Heart: Regular rate and rhythm tachycardic Lungs: Clear to auscultation tachypneic Abdomen: Soft appropriate tenderness mildly distended.  Hypoactive bowel sounds Extremities: No cyanosis clubbing or edema Skin: Mild mottling of skin pallor Neuro: Altered mental status  Assessment/Plan: 1.Acute kidney injury status post Whipple procedure with shock and hypotension postoperatively.  Appears the patient will require renal replacement therapy is anuric after massive volume resuscitation.  Avoid nephrotoxins.  It appears the patient was on ARB as an outpatient. 2. Hypertension/volume  -patient with volume resuscitation blood pressure improved. 3.  Metabolic acidosis we will start CRRT with goal bicarb replacement.  Etiology would include bowel  ischemia. 4.  Shock has responded to massive volume resuscitation.  Possible hemorrhagic versus septic.  Patient may also have had an episode of bowel ischemia leading to shock.  This needs to be borne in mind in the differential diagnosis. 5.  Status post pancreatic cancer status post Whipple's procedure 05/29/2020   Sherril Croon 05/20/2020, 9:08 AM

## 2020-05-20 NOTE — Progress Notes (Signed)
Critical ABG results given to Dr. Leonel Ramsay, and Dr. Dema Severin paged with results as well. RT will continue to monitor.

## 2020-05-20 NOTE — Procedures (Signed)
Intubation Procedure Note  Mary [name]  [id number]  1950-09-22  Date:05/20/20  Time:12:08 PM   Provider Performing:Neythan Kozlov    Procedure: Intubation (31500)  Indication(s) Respiratory Failure  Consent Risks of the procedure as well as the alternatives and risks of each were explained to the patient and/or caregiver.  Consent for the procedure was obtained and is signed in the bedside chart   Anesthesia Rocuronium and Propofol   Time Out Verified patient identification, verified procedure, site/side was marked, verified correct patient position, special equipment/implants available, medications/allergies/relevant history reviewed, required imaging and test results available.   Sterile Technique Usual hand hygeine, masks, and gloves were used   Procedure Description Patient positioned in bed supine.  Sedation given as noted above.  Patient was intubated with endotracheal tube using Direct Laryngoscope.  View was Grade 4 no glottis structures visible.  Number of attempts was 1.  Colorimetric CO2 detector was consistent with tracheal placement.   Complications/Tolerance None; patient tolerated the procedure well. Chest X-ray is ordered to verify placement.   EBL Minimal   Specimen(s) None

## 2020-05-20 NOTE — Consult Note (Signed)
NAME:  Mary [name], MRN:  [id number], DOB:  1951/06/02, LOS: 1 ADMISSION DATE:  06/12/2020, CONSULTATION DATE:  9/3 REFERRING MD:  Dr. Barry Dienes, CHIEF COMPLAINT:  Shock, Acidosis s/p Whipple  Brief History   89 F with pancreatic cancer admitted for Whipple post op developed shock, acidosis, and encephalopathy. PCCM consulted.   History of present illness   Patient is encephalopathic and/or intubated. Therefore history has been obtained from chart review.  69 year old female with past medical history as below, which is significant for for hypertension, hypothyroid, and asthma.  She was also diagnosed with pancreatic adenocarcinoma back in March of this year and has had treatment with 9 courses of gem/abraxane. She presented to [facility] for Whipple procedure on 9/2. The procedure was without complication. Around 0445 the next morning she was noted to be unrepsonisve and a code stroke was called. Ongoing workup was concerning for a metabolic encephalopathy. She was hypotensive as well. Discovered to have profound metabolic acidosis and was started on a bicarb infusion. Despite bicarb and low dose pressors she continued to experience worsening LOC and progressive shock. PCCM was consulted.   Past Medical History   has a past medical history of Abdominal distention (12/07/2019), Anorexia (12/07/2019), Anxiety, Asthma, Atrial septal defect, sinus venosus (12/07/2019), Common bile duct dilation (5/36/1443), Complication of anesthesia, Dilated pancreatic duct (12/07/2019), Dysrhythmia, Elevated lipase (12/07/2019), Essential hypertension (12/07/2019), Family history of breast cancer, History of kidney stones, Hypothyroidism (12/07/2019), Loss of weight (12/07/2019), Nausea (12/07/2019), pancreatic ca (11/2019), Pancreatic mass (12/07/2019), Pneumonia, and Pure hypercholesterolemia (12/07/2019).   Significant Hospital Events   9/2 admit, whipple 9/3 decreased LOC, acidosis, tx to ICU. Intuabted and started on  CRRT.   Consults:  Nephrology Surgery Neurology (code stroke 9/3, signed off)  Procedures:  Whipple 9/2 ETT 9/3 > Port (pre hosp) > HD cath 9/3 >  Significant Diagnostic Tests:  CT head 9/3 > negative  Micro Data:  Blood 9/3 >   Urine 9/3 > Sputum 9/3 >  Antimicrobials:  Cefepime 9/3 > Flagyl 9/3 >  Interim history/subjective:    Objective   Blood pressure (!) 99/49, pulse (!) 105, temperature (!) 95.1 F (35.1 C), temperature source Rectal, resp. rate (!) 30, height 5\' 1"  (1.549 m), weight 57.7 kg, SpO2 100 %.    Vent Mode: PRVC FiO2 (%):  [100 %] 100 % Set Rate:  [30 bmp] 30 bmp Vt Set:  [380 mL] 380 mL PEEP:  [5 cmH20] 5 cmH20 Plateau Pressure:  [15 cmH20] 15 cmH20   Intake/Output Summary (Last 24 hours) at 05/20/2020 1010 Last data filed at 05/20/2020 0843 Gross per 24 hour  Intake 8256.65 ml  Output 1585 ml  Net 6671.65 ml   Filed Weights   06/01/2020 0618  Weight: 57.7 kg    Examination: General: Frail elderly female  HENT: [place]/AT, PERRL, no JVD Lungs: Clear bilateral breath sounds Cardiovascular: Tachy, regular, no MRG Abdomen: Soft, non-tender, mild distention. Surgical wounds intact. Drains x 2 with serosanguinous  Extremities: No acute deformity Neuro: Not responsive, occasional moaning.   Resolved Hospital Problem list     Assessment & Plan:   Pancreatic adenocarcinoma: s/p chemo, whipple 9/3 - Management per surgery - Monitor abdominal drains - Inspection of surgical sites.   Shock: hypovolemic vs hemorrhagic vs septic.  - Levophed for MAP goal 26mmHg - Echocardiogram - Volume resuscitate, will send CBC may need blood  AKI Anion gap metabolic acidosis: ddx includes lactic 2/2 shock vs bowel ischemia, renal failure, DKA -  Sodium bicarbonate infusion - High vent rate  - Insulin infusion - Empiric cefepime, flagyl, cultures - Check CMP every 4 hours - CRRT per nephrology for refractory acidosis  Coagulopathy INR 3.5 - FFP x 2  now - DIC panel pending - Trend coags  Hyperglycemia without DM history Possible DKA - Insulin infusion - Volume - check hydroxybutyrate   Best practice:  Diet: NPO Pain/Anxiety/Delirium protocol (if indicated): Propofol/ fent PRn for RASS 0 to -1 VAP protocol (if indicated): Yes DVT prophylaxis: SCD GI prophylaxis: PPO Glucose control: Insulin drip Mobility: BR Code Status: DNR Family Communication: Husband updated at length. Wants give full aggressive care short of CPR/ACLS. DNR Disposition: ICU  Labs   CBC: Recent Labs  Lab 05/16/20 1315 06/04/2020 0859 06/07/2020 1507 06/04/2020 1549 05/23/2020 1750 05/20/20 0513 05/20/20 0602  WBC 5.4  --   --   --   --  25.9*  --   NEUTROABS 3.1  --   --   --   --   --   --   HGB 11.0*   < > 11.9* 12.6 13.9 11.9* 10.9*  HCT 34.4*   < > 35.0* 37.0 41.0 39.1 32.0*  MCV 102.7*  --   --   --   --  99.2  --   PLT 356  --   --   --   --  325  --    < > = values in this interval not displayed.    Basic Metabolic Panel: Recent Labs  Lab 05/16/20 1315 05/18/2020 0859 06/13/2020 1549 06/01/2020 1717 05/29/2020 1750 05/20/20 0513 05/20/20 0602  NA 141   < > 138 135 139 141 140  K 4.7   < > 4.6 4.5 4.3 4.9 4.5  CL 105  --   --  103  --  106  --   CO2 25  --   --  14*  --  <7*  --   GLUCOSE 109*  --   --  327*  --  336*  --   BUN 16  --   --  10  --  8  --   CREATININE 1.23*  --   --  1.34*  --  2.30*  --   CALCIUM 9.4  --   --  7.9*  --  7.8*  --   MG  --   --   --   --   --  1.8  --   PHOS  --   --   --   --   --  8.3*  --    < > = values in this interval not displayed.   GFR: Estimated Creatinine Clearance: 19.1 mL/min (A) (by C-G formula based on SCr of 2.3 mg/dL (H)). Recent Labs  Lab 05/16/20 1315 05/20/20 0513  WBC 5.4 25.9*    Liver Function Tests: Recent Labs  Lab 05/16/20 1315 05/29/2020 1717 05/20/20 0513  AST 36 133* 684*  ALT 28 70* 440*  ALKPHOS 222* 97 132*  BILITOT 0.4 3.0* 5.6*  PROT 6.8 5.3* 4.1*  ALBUMIN  3.4* 3.3* 2.6*   Recent Labs  Lab 05/16/20 1315  LIPASE 31   No results for input(s): AMMONIA in the last 168 hours.  ABG    Component Value Date/Time   PHART 7.056 (LL) 05/20/2020 0602   PCO2ART 19.1 (LL) 05/20/2020 0602   PO2ART 82 (L) 05/20/2020 0602   HCO3 5.6 (L) 05/20/2020 0602   TCO2 6 (L) 05/20/2020 0602  ACIDBASEDEF 24.0 (H) 05/20/2020 0602   O2SAT 94.0 05/20/2020 0602     Coagulation Profile: Recent Labs  Lab 05/16/20 1315 05/20/20 0513  INR 1.0 3.5*    Cardiac Enzymes: No results for input(s): CKTOTAL, CKMB, CKMBINDEX, TROPONINI in the last 168 hours.  HbA1C: Hgb A1c MFr Bld  Date/Time Value Ref Range Status  05/16/2020 01:15 PM 5.8 (H) 4.8 - 5.6 % Final    Comment:    (NOTE) Pre diabetes:          5.7%-6.4%  Diabetes:              >6.4%  Glycemic control for   <7.0% adults with diabetes     CBG: Recent Labs  Lab 05/20/20 0653 05/20/20 0828  GLUCAP 290* 271*    Review of Systems:   unable  Past Medical History  She,  has a past medical history of Abdominal distention (12/07/2019), Anorexia (12/07/2019), Anxiety, Asthma, Atrial septal defect, sinus venosus (12/07/2019), Common bile duct dilation (5/63/1497), Complication of anesthesia, Dilated pancreatic duct (12/07/2019), Dysrhythmia, Elevated lipase (12/07/2019), Essential hypertension (12/07/2019), Family history of breast cancer, History of kidney stones, Hypothyroidism (12/07/2019), Loss of weight (12/07/2019), Nausea (12/07/2019), pancreatic ca (11/2019), Pancreatic mass (12/07/2019), Pneumonia, and Pure hypercholesterolemia (12/07/2019).   Surgical History    Past Surgical History:  Procedure Laterality Date  . ASD REPAIR, SINUS VENOSUS     at age 48   . BILIARY STENT PLACEMENT  12/11/2019   Procedure: BILIARY STENT PLACEMENT;  Surgeon: [name], MD;  Location: [facility] ENDOSCOPY;  Service: Endoscopy;;  . COLONOSCOPY    . ENDOSCOPIC RETROGRADE CHOLANGIOPANCREATOGRAPHY (ERCP) WITH PROPOFOL  N/A 12/11/2019   Procedure: ENDOSCOPIC RETROGRADE CHOLANGIOPANCREATOGRAPHY (ERCP) WITH PROPOFOL;  Surgeon: [name], MD;  Location: [facility] ENDOSCOPY;  Service: Endoscopy;  Laterality: N/A;  . ESOPHAGOGASTRODUODENOSCOPY (EGD) WITH PROPOFOL N/A 12/11/2019   Procedure: ESOPHAGOGASTRODUODENOSCOPY (EGD) WITH PROPOFOL;  Surgeon: [name], MD;  Location: [facility] ENDOSCOPY;  Service: Endoscopy;  Laterality: N/A;  . FINE NEEDLE ASPIRATION  12/11/2019   Procedure: FINE NEEDLE ASPIRATION (FNA) LINEAR;  Surgeon: [name], MD;  Location: [place];  Service: Endoscopy;;  . IR IMAGING GUIDED PORT INSERTION  01/04/2020  . LAPAROSCOPY N/A 05/18/2020   Procedure: LAPAROSCOPY DIAGNOSTIC;  Surgeon: Stark Klein, MD;  Location: [place];  Service: General;  Laterality: N/A;  . TONSILLECTOMY    . TRANSTHORACIC ECHOCARDIOGRAM     08/18/19 ([facility]): Normal LV/RV size and LVF/RVF, EF 55%. Borderline LV wall thickness. Normal PASP 26 mmHg. Normal cardiac valves. No residual shunt. No evidence of RV overload or enlargement.   . TUBAL LIGATION    . UPPER ESOPHAGEAL ENDOSCOPIC ULTRASOUND (EUS) N/A 12/11/2019   Procedure: UPPER ESOPHAGEAL ENDOSCOPIC ULTRASOUND (EUS);  Surgeon: [name], MD;  Location: [facility] ENDOSCOPY;  Service: Endoscopy;  Laterality: N/A;  . WHIPPLE PROCEDURE N/A 05/25/2020   Procedure: WHIPPLE PROCEDURE;  Surgeon: Stark Klein, MD;  Location: [place];  Service: General;  Laterality: N/A;     Social History   reports that she has never smoked. She has never used smokeless tobacco. She reports that she does not drink alcohol and does not use drugs.   Family History   Her family history includes Alzheimer's disease in her brother; Breast cancer in her paternal aunt; COPD in her mother; Dementia in her father, paternal grandfather, and paternal uncle; Diabetes in her father, paternal aunt, and paternal uncle; Hypertension in her father; Other in her maternal grandfather.  Allergies Allergies  Allergen Reactions  . Ultram [Tramadol] Nausea And Vomiting  . Penicillins Rash    Did it involve swelling of the face/tongue/throat, SOB, or low BP? No Did it involve sudden or severe rash/hives, skin peeling, or any reaction on the inside of your mouth or nose? No Did you need to seek medical attention at a hospital or doctor's office? No When did it last happen?20 + years If all above answers are "NO", may proceed with cephalosporin use.   . Sulfa Antibiotics Rash     Home Medications  Prior to Admission medications   Medication Sig Start Date End Date Taking? Authorizing Provider  acetaminophen (TYLENOL) 500 MG tablet Take 500-1,000 mg by mouth every 6 (six) hours as needed for moderate pain or headache.    Yes [provider]  albuterol (VENTOLIN HFA) 108 (90 Base) MCG/ACT inhaler Inhale 2 puffs into the lungs every 4 (four) hours as needed for wheezing or shortness of breath.  08/18/19  Yes [provider]  aspirin EC 81 MG tablet Take 81 mg by mouth at bedtime.    Yes [provider]  dicyclomine (BENTYL) 10 MG capsule TAKE 1 CAPSULE BY MOUTH TWICE A DAY AS NEEDED FOR SPASMS Patient taking differently: Take 10 mg by mouth 2 (two) times daily as needed (abdominal spasms).  02/09/20  Yes Alla Feeling, NP  Homeopathic Products (ARNICA EX) Apply 1 application topically 2 (two) times daily as needed (foot burning). [employer]ish Foot Cream (blend of botanical oils Tea Tree/Peppermint/Arnica)    Yes [provider]  HYDROcodone-acetaminophen (NORCO/VICODIN) 5-325 MG tablet Take 1 tablet by mouth every 6 (six) hours as needed for moderate pain. 12/15/19  Yes Alla Feeling, NP  lidocaine-prilocaine (EMLA) cream Apply 1 application topically as needed. Patient taking differently: Apply 1 application topically as needed (prior to port being accessed.).  12/31/19  Yes Ladell Pier, MD  LORazepam (ATIVAN) 0.5 MG tablet Take 1 tablet  (0.5 mg total) by mouth every 12 (twelve) hours as needed for anxiety. Do not drive while taking Patient taking differently: Take 0.5 mg by mouth every 12 (twelve) hours as needed for anxiety or sleep. Do not drive while taking 7/56/43  Yes Ladell Pier, MD  losartan (COZAAR) 50 MG tablet Take 50 mg by mouth at bedtime.  11/29/19  Yes [provider]  mirtazapine (REMERON) 7.5 MG tablet TAKE 1 TABLET BY MOUTH EVERYDAY AT BEDTIME Patient taking differently: Take 7.5 mg by mouth at bedtime as needed (sleep.).  04/06/20  Yes Ladell Pier, MD  ondansetron (ZOFRAN-ODT) 8 MG disintegrating tablet Take 1 tablet (8 mg total) by mouth every 8 (eight) hours as needed for nausea or vomiting. 12/29/19  Yes Ladell Pier, MD  oxymetazoline (VICKS SINEX) 0.05 % nasal spray Place 1 spray into both nostrils 2 (two) times daily as needed for congestion.    Yes [provider]  polyethylene glycol (MIRALAX / GLYCOLAX) 17 g packet Take 17 g by mouth daily as needed (constipation.).    Yes [provider]  prochlorperazine (COMPAZINE) 10 MG tablet Take 1 tablet (10 mg total) by mouth every 6 (six) hours as needed for nausea or vomiting. Patient taking differently: Take 10 mg by mouth every 6 (six) hours as needed for nausea or vomiting. Nausea/vomiting 12/31/19  Yes Ladell Pier, MD  senna-docusate (SENOKOT-S) 8.6-50 MG tablet Take 1 tablet by mouth 2 (two) times daily as needed for mild constipation.  Yes [provider]     Critical care time: 55 mins     Georgann Housekeeper, AGACNP-BC [place] for personal pager PCCM on call pager [phone]  05/20/2020 11:44 AM

## 2020-05-20 NOTE — Progress Notes (Signed)
Per phone conversation with Dr Barry Dienes, she would like patient's ropivacaine stopped and a fentanyl drip used instead. Orders placed to reflect the conversation.  Will continue to monitor  Hiram Gash RN

## 2020-05-20 NOTE — Consult Note (Signed)
Neurology Consultation Reason for Consult: Decreased responsiveness Referring Physician: Dema Severin, C  CC: Decreased responsiveness  History is obtained from: Chart review  HPI: Mary [name] is a 69 y.o. female with a history of pancreatic cancer who was admitted for Whipple procedure and underwent the procedure on 9/2.  She was arousable and interactive up until midnight and subsequently was checked on again around 4 AM.  At her recheck, she was unresponsive pupils were reportedly asymmetric with decreased responsiveness.  Blood pressures in the 66A systolic.  Code stroke was activated.  She was given Narcan.  She apparently did have some response to the Narcan   LKW: midnight tpa given?: no, out of window    ROS: Unable to obtain due to altered mental status.   Past Medical History:  Diagnosis Date  . Abdominal distention 12/07/2019  . 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 Anxiety    per patient  . Asthma    "adult" asthma  . Atrial septal defect, sinus venosus 12/07/2019  . Common bile duct dilation 12/07/2019  . Complication of anesthesia    had trouble waking up on her first colonoscopy  . Dilated pancreatic duct 12/07/2019  . Dysrhythmia    palpitatations  . Elevated lipase 12/07/2019  . Essential hypertension 12/07/2019  . Family history of breast cancer   . History of kidney stones    on xray only  . Hypothyroidism 12/07/2019   "being watched" not on medications at this time  . Loss of weight 12/07/2019  . Nausea 12/07/2019  . pancreatic ca 11/2019  . Pancreatic mass 12/07/2019  . Pneumonia   . Pure hypercholesterolemia 12/07/2019     Family History  Problem Relation Age of Onset  . COPD Mother   . Dementia Father   . Diabetes Father   . Hypertension Father   . Alzheimer's disease Brother   . Breast cancer Paternal Aunt        dx. in her early 33s  . Diabetes Paternal Aunt   . Diabetes Paternal Uncle   . Other Maternal Grandfather        hospitalized for rectal bleeding  before death  . Dementia Paternal Grandfather   . Dementia Paternal Uncle      Social History:  reports that she has never smoked. She has never used smokeless tobacco. She reports that she does not drink alcohol and does not use drugs.   Exam: Current vital signs: BP (!) 84/56   Pulse (!) 105   Temp (!) 96.4 F (35.8 C) (Oral)   Resp (!) 22   Ht 5\' 1"  (1.549 m)   Wt 57.7 kg   SpO2 95%   BMI 24.04 kg/m  Vital signs in last 24 hours: Temp:  [96.4 F (35.8 C)-98.1 F (36.7 C)] 96.4 F (35.8 C) (09/03 0400) Pulse Rate:  [78-106] 105 (09/03 0400) Resp:  [15-35] 22 (09/03 0404) BP: (80-175)/(47-77) 84/56 (09/03 0400) SpO2:  [95 %-100 %] 95 % (09/03 0404) Arterial Line BP: (63-177)/(51-111) 63/51 (09/03 0400) Weight:  [57.7 kg] 57.7 kg (09/02 0618)   Physical Exam  Constitutional: Appears chronically ill.  Psych: does not interact much Eyes: No scleral injection HENT: NG tube in place.  MSK: no joint deformities.  Cardiovascular: Normal rate and regular rhythm.  Respiratory: slightly tachypnic GI: Soft.  No distension. There is no tenderness.  Skin: WDI  Neuro: Mental Status: Patient is arousable to noxious stimulation, she follows commands, when she attempts to speak, her mouth is very dry,  and I am not able to understand her, but suspect this is more mechanical than neurological Cranial Nerves: II: She blinks to threat bilaterally. pupils are 7 mm in the right, 6 mm on the left and reactive bilaterally III,IV, VI: She keeps her eyes midline for most of the examination, however with repeated prompting she does look to both the left and right with intact versions V: VII: She blinks to eyelid stimulation bilaterally Motor: She wiggles toes to command bilaterally, she is able to hold each arm against gravity after repeated prompting for brief time before letting it drop.   Sensory: She response to noxious stimuli in all four extremities  Cerebellar: She does not  perform   I have reviewed labs in epic and the results pertinent to this consultation are: pH 7.0 02/02/81 with a bicarb of 5.6  I have reviewed the images obtained: CT head-negative  Impression: 69 year old female with nonfocal exam with severe acidosis.  I suspect that this is metabolic in etiology, would favor correcting her underlying metabolic deficits.  If she continues to have neurological symptoms after correction of her metabolic issues, then please call for reconsultation and consider MRI.  Recommendations: 1) CT head and ABG initially recommended, performed at the time of finalizing this note. 2) please call if neurology can be of any further assistance.   Roland Rack, MD [facility] [phone]  If 7pm- 7am, please page neurology on call as listed in [place].

## 2020-05-20 NOTE — Progress Notes (Signed)
IV TEAM: Received a page for code stroke. Communicated with [facility] staff and was told IV team is not needed at this time as patient has 2 functional PIVs.

## 2020-05-20 NOTE — Progress Notes (Signed)
Initial Nutrition Assessment  DOCUMENTATION CODES:   Not applicable  INTERVENTION:   Once stable and acute abd ruled out recommend initiating trickle TF  Vital 1.5 @ 20 ml/hr   NUTRITION DIAGNOSIS:   Increased nutrient needs related to post-op healing as evidenced by estimated needs.  GOAL:   Patient will meet greater than or equal to 90% of their needs  MONITOR:   Vent status, I & O's  REASON FOR ASSESSMENT:   Ventilator    ASSESSMENT:   Pt with PMH of HTN, asthma, who was recently dx with pancreatic cancer 11/2019 s/p chemo admitted 9/2 for Whipple.   9/2 whipple 9/3 found unresponsive with post op shock, acidosis, and encephalopathy;  differential includes ischemic bowel. Pt started on CRRT   Patient is currently intubated on ventilator support MV: 11.4 L/min Temp (24hrs), Avg:96.2 F (35.7 C), Min:93.4 F (34.1 C), Max:98 F (36.7 C)  Propofol: 10 ml/hr provides: 264 kcal  Medications reviewed and include:  D5 LR @ 125 ml  Insulin drip  Levo @ 5 mcg  Neo @ 200 mcg Na bicarb  Labs reviewed: TG: 289 Increased AST/ALT CBG's: 163-208   42 F NG tube - tip gastric   Diet Order:   Diet Order            Diet NPO time specified  Diet effective now                 EDUCATION NEEDS:   No education needs have been identified at this time  Skin:  Skin Assessment: Reviewed RN Assessment  Last BM:  unknown  Height:   Ht Readings from Last 1 Encounters:  05/29/2020 5\' 1"  (1.549 m)    Weight:   Wt Readings from Last 1 Encounters:  06/04/2020 57.7 kg    Ideal Body Weight:  47.7 kg  BMI:  Body mass index is 24.04 kg/m.  Estimated Nutritional Needs:   Kcal:  1700  Protein:  115-140 grams  Fluid:  >1.7 L/day  Lockie Pares., RD, LDN, CNSC See [facility] for contact information

## 2020-05-20 NOTE — Progress Notes (Signed)
Notified by RN that she was found unresponsive and with a single blown pupil at 0445. She had previously been awake and responsive up until midnight. She has fentanyl PCA and using regularly until midnight when she hasn't used once. Narcan given. BP ~80s/50s, HR low 100s. Sats 95% on 2L. 1L Bolus given, anesthesia notified of pressure. Given her acute status change and reported pupil exam, code stroke was called by nursing  I arrived to evaluate her. Her pupils are ~4-5 mm, reactive and appear relatively equal to me.  BP better 90s/50s HR 100s UOP appears to have been 200cc over last 10 hrs  Abdomen is soft, not significantly distended; JPs are somewhat bloody - 140cc is highest output however for the last shift  I have been able to discuss everything with Dr. Marvel Plan as well - she had very little uop throughout surgery and was fairly acidotic requiring bicarbonate  A-line apparently fell out but is being replaced now; will obtain abg - she has been quite acidotic - at 3pm abg showed bicarb of 17 and then subsequent of 14. pCO2 has been normal. 1L crystalloid bolus; given all the changes will also transfuse 1U PRBC as well Anticipate her mental status will improve as acidosis is corrected and resuscitation catches up Monitor UOP hourly  Ordered CBG checks/thin insulin - cbg noted to be 327 late afternoon AM labs pending Epidural being assessed by anesthesia team - appreciate support CT Head as per code stroke team

## 2020-05-20 NOTE — Procedures (Signed)
Central Venous Catheter Insertion Procedure Note Mary [name] [id number] [birth year]  Procedure: Insertion of Central Venous Catheter Indications: hemodialysis   Procedure Details Consent: Risks of procedure as well as the alternatives and risks of each were explained to the (patient/caregiver).  Consent for procedure obtained. Time Out: Verified patient identification, verified procedure, site/side was marked, verified correct patient position, special equipment/implants available, medications/allergies/relevent history reviewed, required imaging and test results available.  Performed  Maximum sterile technique was used including antiseptics, cap, gloves, gown, hand hygiene, mask and sheet. Skin prep: Chlorhexidine; local anesthetic administered A antimicrobial bonded/coated triple lumen catheter was placed in the left internal jugular vein using the Seldinger technique. Catheter placed to 20 cm. Blood aspirated via all 3 ports and then flushed x 3. Line sutured x 2 and dressing applied.  Ultrasound guidance used.Yes.    Evaluation Blood flow good Complications: No apparent complications Patient did tolerate procedure well. Chest X-ray ordered to verify placement.  CXR: normal.   Georgann Housekeeper, AGACNP-BC [place] for personal pager PCCM on call pager [phone]  05/20/2020 2:07 PM

## 2020-05-20 NOTE — Progress Notes (Addendum)
Inpatient Diabetes Program Recommendations  AACE/ADA: New Consensus Statement on Inpatient Glycemic Control (2015)  Target Ranges:  Prepandial:   less than 140 mg/dL      Peak postprandial:   less than 180 mg/dL (1-2 hours)      Critically ill patients:  140 - 180 mg/dL   Lab Results  Component Value Date   GLUCAP 208 (H) 05/20/2020   HGBA1C 5.8 (H) 05/16/2020    Review of Glycemic Control  Results for SHAQUINA, GILLHAM (MRN [id number]) as of 05/20/2020 12:32  Ref. Range 05/20/2020 06:53 05/20/2020 08:28 05/20/2020 12:17  Glucose-Capillary Latest Ref Range: 70 - 99 mg/dL 290 (H) 271 (H) 208 (H)   Diabetes history:  None-Post Whipple procedure Outpatient Diabetes medications:  None Current orders for Inpatient glycemic control:  IV insulin/DKA order set  Inpatient Diabetes Program Recommendations:    Note patient post-whipple and now being treated for possible DKA with IV insulin.  Beta-hydroxybutyrate pending.    Thanks,  Adah Perl, RN, BC-ADM Inpatient Diabetes Coordinator Pager [phone] (8a-5p)

## 2020-05-20 NOTE — Anesthesia Post-op Follow-up Note (Signed)
  Anesthesia Pain Follow-up Note  [name]  Day #: 1  Date of Follow-up: 05/20/2020 Time: 2:08 PM  Last Vitals:  Vitals:   05/20/20 1200 05/20/20 1309  BP:    Pulse:  (!) 109  Resp:  (!) 30  Temp: 36.6 C 36.7 C  SpO2:  100%    Level of Consciousness: intubated and sedated on propofol, opens eyes with stimulation Pain: none   Side Effects:None  Catheter Site Exam: Dressing intact, small amount of dried blood at insertion site. No swelling at insertion site.  Anti-Coag Meds (From admission, onward)   Start     Dose/Rate Route Frequency Ordered Stop   05/20/20 0919  heparin injection 1,000-6,000 Units        1,000-6,000 Units CRRT As needed 05/20/20 0920      Epidural / Intrathecal (From admission, onward)   Start     Dose/Rate Route Frequency Ordered Stop   06/08/2020 1930  ropivacaine (PF) 2 mg/mL (0.2%) (NAROPIN) injection        6 mL/hr 6 mL/hr  Epidural Continuous 06/12/2020 1601 05/24/20 1929       Plan: Continue current therapy of postop epidural at surgeon's request. Epidural was stopped for approximately 2 hours this morning due to hypotension, restarted around 8am per surgeon request at previous rate of 6cc/hr. Patient has had worsening hemodynamic status and enceophalopathy today and required intubation around noon.    Brennan Bailey, MD Anesthesiology

## 2020-05-21 DIAGNOSIS — K7200 Acute and subacute hepatic failure without coma: Secondary | ICD-10-CM

## 2020-05-21 LAB — RENAL FUNCTION PANEL
Albumin: 3.8 g/dL (ref 3.5–5.0)
Anion gap: 14 (ref 5–15)
BUN: <5 mg/dL — ABNORMAL LOW (ref 8–23)
CO2: 30 mmol/L (ref 22–32)
Calcium: 7.9 mg/dL — ABNORMAL LOW (ref 8.9–10.3)
Chloride: 93 mmol/L — ABNORMAL LOW (ref 98–111)
Creatinine, Ser: 1.12 mg/dL — ABNORMAL HIGH (ref 0.44–1.00)
GFR calc Af Amer: 58 mL/min — ABNORMAL LOW (ref >60)
GFR calc non Af Amer: 50 mL/min — ABNORMAL LOW (ref >60)
Glucose, Bld: 71 mg/dL (ref 70–99)
Phosphorus: 3 mg/dL (ref 2.5–4.6)
Potassium: 4.9 mmol/L (ref 3.5–5.1)
Sodium: 137 mmol/L (ref 135–145)

## 2020-05-21 LAB — PREPARE FRESH FROZEN PLASMA

## 2020-05-21 LAB — BLOOD GAS, ARTERIAL
Acid-Base Excess: 7.8 mmol/L — ABNORMAL HIGH (ref 0.0–2.0)
Bicarbonate: 31.5 mmol/L — ABNORMAL HIGH (ref 20.0–28.0)
FIO2: 40
O2 Saturation: 96.3 %
Patient temperature: 37
pCO2 arterial: 41.1 mmHg (ref 32.0–48.0)
pH, Arterial: 7.496 — ABNORMAL HIGH (ref 7.350–7.450)
pO2, Arterial: 77.1 mmHg — ABNORMAL LOW (ref 83.0–108.0)

## 2020-05-21 LAB — COMPREHENSIVE METABOLIC PANEL
ALT: 973 U/L — ABNORMAL HIGH (ref 0–44)
ALT: 1004 U/L — ABNORMAL HIGH (ref 0–44)
ALT: 1023 U/L — ABNORMAL HIGH (ref 0–44)
AST: 2122 U/L — ABNORMAL HIGH (ref 15–41)
AST: 1896 U/L — ABNORMAL HIGH (ref 15–41)
AST: 1892 U/L — ABNORMAL HIGH (ref 15–41)
Albumin: 3.4 g/dL — ABNORMAL LOW (ref 3.5–5.0)
Albumin: 4 g/dL (ref 3.5–5.0)
Albumin: 3.9 g/dL (ref 3.5–5.0)
Alkaline Phosphatase: 255 U/L — ABNORMAL HIGH (ref 38–126)
Alkaline Phosphatase: 263 U/L — ABNORMAL HIGH (ref 38–126)
Alkaline Phosphatase: 253 U/L — ABNORMAL HIGH (ref 38–126)
Anion gap: 18 — ABNORMAL HIGH (ref 5–15)
Anion gap: 19 — ABNORMAL HIGH (ref 5–15)
Anion gap: 16 — ABNORMAL HIGH (ref 5–15)
BUN: 5 mg/dL — ABNORMAL LOW (ref 8–23)
BUN: <5 mg/dL — ABNORMAL LOW (ref 8–23)
BUN: <5 mg/dL — ABNORMAL LOW (ref 8–23)
CO2: 24 mmol/L (ref 22–32)
CO2: 25 mmol/L (ref 22–32)
CO2: 27 mmol/L (ref 22–32)
Calcium: 7.7 mg/dL — ABNORMAL LOW (ref 8.9–10.3)
Calcium: 7.8 mg/dL — ABNORMAL LOW (ref 8.9–10.3)
Calcium: 7.6 mg/dL — ABNORMAL LOW (ref 8.9–10.3)
Chloride: 97 mmol/L — ABNORMAL LOW (ref 98–111)
Chloride: 94 mmol/L — ABNORMAL LOW (ref 98–111)
Chloride: 93 mmol/L — ABNORMAL LOW (ref 98–111)
Creatinine, Ser: 1.48 mg/dL — ABNORMAL HIGH (ref 0.44–1.00)
Creatinine, Ser: 1.33 mg/dL — ABNORMAL HIGH (ref 0.44–1.00)
Creatinine, Ser: 1.28 mg/dL — ABNORMAL HIGH (ref 0.44–1.00)
GFR calc Af Amer: 42 mL/min — ABNORMAL LOW (ref >60)
GFR calc Af Amer: 47 mL/min — ABNORMAL LOW (ref >60)
GFR calc Af Amer: 50 mL/min — ABNORMAL LOW (ref >60)
GFR calc non Af Amer: 36 mL/min — ABNORMAL LOW (ref >60)
GFR calc non Af Amer: 41 mL/min — ABNORMAL LOW (ref >60)
GFR calc non Af Amer: 43 mL/min — ABNORMAL LOW (ref >60)
Glucose, Bld: 150 mg/dL — ABNORMAL HIGH (ref 70–99)
Glucose, Bld: 177 mg/dL — ABNORMAL HIGH (ref 70–99)
Glucose, Bld: 140 mg/dL — ABNORMAL HIGH (ref 70–99)
Potassium: 4.5 mmol/L (ref 3.5–5.1)
Potassium: 4.3 mmol/L (ref 3.5–5.1)
Potassium: 3.8 mmol/L (ref 3.5–5.1)
Sodium: 139 mmol/L (ref 135–145)
Sodium: 138 mmol/L (ref 135–145)
Sodium: 136 mmol/L (ref 135–145)
Total Bilirubin: 8 mg/dL — ABNORMAL HIGH (ref 0.3–1.2)
Total Bilirubin: 8.5 mg/dL — ABNORMAL HIGH (ref 0.3–1.2)
Total Bilirubin: 8.5 mg/dL — ABNORMAL HIGH (ref 0.3–1.2)
Total Protein: 5 g/dL — ABNORMAL LOW (ref 6.5–8.1)
Total Protein: 5.1 g/dL — ABNORMAL LOW (ref 6.5–8.1)
Total Protein: 5.3 g/dL — ABNORMAL LOW (ref 6.5–8.1)

## 2020-05-21 LAB — GLUCOSE, CAPILLARY
Glucose-Capillary: 150 mg/dL — ABNORMAL HIGH (ref 70–99)
Glucose-Capillary: 165 mg/dL — ABNORMAL HIGH (ref 70–99)
Glucose-Capillary: 170 mg/dL — ABNORMAL HIGH (ref 70–99)
Glucose-Capillary: 188 mg/dL — ABNORMAL HIGH (ref 70–99)
Glucose-Capillary: 143 mg/dL — ABNORMAL HIGH (ref 70–99)
Glucose-Capillary: 158 mg/dL — ABNORMAL HIGH (ref 70–99)
Glucose-Capillary: 114 mg/dL — ABNORMAL HIGH (ref 70–99)
Glucose-Capillary: 105 mg/dL — ABNORMAL HIGH (ref 70–99)
Glucose-Capillary: 86 mg/dL (ref 70–99)
Glucose-Capillary: 106 mg/dL — ABNORMAL HIGH (ref 70–99)
Glucose-Capillary: 65 mg/dL — ABNORMAL LOW (ref 70–99)
Glucose-Capillary: 100 mg/dL — ABNORMAL HIGH (ref 70–99)
Glucose-Capillary: 71 mg/dL (ref 70–99)

## 2020-05-21 LAB — BPAM FFP
Blood Product Expiration Date: 202109082359
Blood Product Expiration Date: 202109082359
ISSUE DATE / TIME: 202109031002
ISSUE DATE / TIME: 202109031002
Unit Type and Rh: 5100
Unit Type and Rh: 5100

## 2020-05-21 LAB — CBC
HCT: 31.5 % — ABNORMAL LOW (ref 36.0–46.0)
Hemoglobin: 10.7 g/dL — ABNORMAL LOW (ref 12.0–15.0)
MCH: 31.6 pg (ref 26.0–34.0)
MCHC: 34 g/dL (ref 30.0–36.0)
MCV: 92.9 fL (ref 80.0–100.0)
Platelets: 122 10*3/uL — ABNORMAL LOW (ref 150–400)
RBC: 3.39 MIL/uL — ABNORMAL LOW (ref 3.87–5.11)
RDW: 17.2 % — ABNORMAL HIGH (ref 11.5–15.5)
WBC: 17.8 10*3/uL — ABNORMAL HIGH (ref 4.0–10.5)
nRBC: 0 % (ref 0.0–0.2)

## 2020-05-21 LAB — POCT I-STAT 7, (LYTES, BLD GAS, ICA,H+H)
Acid-Base Excess: 8 mmol/L — ABNORMAL HIGH (ref 0.0–2.0)
Bicarbonate: 30.3 mmol/L — ABNORMAL HIGH (ref 20.0–28.0)
Calcium, Ion: 0.87 mmol/L — CL (ref 1.15–1.40)
HCT: 30 % — ABNORMAL LOW (ref 36.0–46.0)
Hemoglobin: 10.2 g/dL — ABNORMAL LOW (ref 12.0–15.0)
O2 Saturation: 98 %
Patient temperature: 91.8
Potassium: 3.9 mmol/L (ref 3.5–5.1)
Sodium: 136 mmol/L (ref 135–145)
TCO2: 31 mmol/L (ref 22–32)
pCO2 arterial: 28.4 mmHg — ABNORMAL LOW (ref 32.0–48.0)
pH, Arterial: 7.624 (ref 7.350–7.450)
pO2, Arterial: 72 mmHg — ABNORMAL LOW (ref 83.0–108.0)

## 2020-05-21 LAB — PHOSPHORUS: Phosphorus: 2.7 mg/dL (ref 2.5–4.6)

## 2020-05-21 LAB — LACTIC ACID, PLASMA: Lactic Acid, Venous: 8.5 mmol/L (ref 0.5–1.9)

## 2020-05-21 LAB — BETA-HYDROXYBUTYRIC ACID: Beta-Hydroxybutyric Acid: 0.11 mmol/L (ref 0.05–0.27)

## 2020-05-21 LAB — MAGNESIUM: Magnesium: 2 mg/dL (ref 1.7–2.4)

## 2020-05-21 MED ORDER — URSODIOL 60 MG/ML SUSP
250.0000 mg | Freq: Every day | ORAL | Status: DC
  Filled 2020-05-21 (×2): qty 8.3

## 2020-05-21 MED ORDER — INSULIN ASPART 100 UNIT/ML ~~LOC~~ SOLN: 0.0000 [IU] | SUBCUTANEOUS | Status: DC

## 2020-05-21 MED ORDER — SODIUM CHLORIDE 0.9 % IV SOLN: INTRAVENOUS | Status: DC | PRN

## 2020-05-21 MED ORDER — PRISMASOL BGK 4/2.5 32-4-2.5 MEQ/L REPLACEMENT SOLN
Status: DC
  Filled 2020-05-21 (×2): qty 5000

## 2020-05-21 MED ORDER — DEXTROSE 50 % IV SOLN
INTRAVENOUS | Status: AC
  Administered 2020-05-22: 25 mL
  Filled 2020-05-21: qty 50

## 2020-05-21 MED ORDER — LACTULOSE 10 GM/15ML PO SOLN
10.0000 g | Freq: Three times a day (TID) | ORAL | Status: DC
  Administered 2020-05-21 – 2020-05-22 (×4): 10 g
  Filled 2020-05-21 (×4): qty 15

## 2020-05-21 MED ORDER — INSULIN DETEMIR 100 UNIT/ML ~~LOC~~ SOLN
5.0000 [IU] | SUBCUTANEOUS | Status: DC
  Administered 2020-05-21: 5 [IU] via SUBCUTANEOUS
  Filled 2020-05-21 (×4): qty 0.05

## 2020-05-21 MED ORDER — DEXTROSE 50 % IV SOLN
INTRAVENOUS | Status: AC
  Administered 2020-05-21: 25 mL
  Filled 2020-05-21: qty 50

## 2020-05-21 MED ORDER — URSODIOL 300 MG PO CAPS
300.0000 mg | ORAL_CAPSULE | Freq: Every day | ORAL | Status: DC
  Filled 2020-05-21: qty 1

## 2020-05-21 MED ORDER — PRISMASOL BGK 4/2.5 32-4-2.5 MEQ/L REPLACEMENT SOLN
Status: DC
  Filled 2020-05-21 (×4): qty 5000

## 2020-05-21 MED ORDER — URSODIOL 60 MG/ML SUSP
300.0000 mg | Freq: Every day | ORAL | Status: DC
  Administered 2020-05-21 – 2020-05-22 (×2): 300 mg via NASOGASTRIC
  Filled 2020-05-21 (×3): qty 10

## 2020-05-21 NOTE — Plan of Care (Signed)
  Problem: Safety: Goal: Ability to remain free from injury will improve Outcome: Progressing   

## 2020-05-21 NOTE — Progress Notes (Addendum)
Follow up - Trauma Critical Care  Patient Details:    Mary [name] is an 69 y.o. female.  Lines/tubes : Airway 7.5 mm (Active)  Secured at (cm) 23 cm 05/21/20 0206  Measured From Lips 05/21/20 0206  Secured Location Center 05/21/20 0206  Secured By [employer] 05/21/20 0206  Tube Holder Repositioned Yes 05/21/20 0206  Cuff Pressure (cm H2O) 29 cm H2O 05/20/20 1006  Site Condition Cool;Dry 05/21/20 0206     Implanted Port 01/04/20 Right Chest (Active)  Site Assessment Clean;Dry;Intact 05/20/20 1945  Port Intervention Accessed 05/20/20 1945  Needle Size PH 20 gauge 05/20/20 1023  Line Care Connections checked and tightened 05/20/20 1945  Line Status Infusing 05/20/20 1945  Dressing Type Transparent;Occlusive 05/20/20 1945  Dressing Status Antimicrobial disc in place;Intact;Dry;Clean 05/20/20 1945  Dressing Intervention New dressing 05/20/20 1023  Needle Change Due 05/27/20 05/20/20 1945     Epidural Catheter 06/16/2020 (Active)  Site Assessment Clean;Dry;Intact 05/20/20 1945  Line Status Other (Comment) 05/20/20 1945  Dressing Type Transparent;Occlusive 05/20/20 1945  Dressing Status Clean;Dry;Intact 05/20/20 1945     Closed System Drain 1 Right;Medial Abdomen Other (Comment) 19 Fr. (Active)  Site Description Unremarkable 05/20/20 2000  Dressing Status Clean;Dry;Intact 05/20/20 2000  Drainage Appearance Bloody 05/20/20 2000  Status To gravity (Uncharged) 05/20/20 2000  Output (mL) 75 mL 05/21/20 1000     Closed System Drain 2 Right;Lateral Abdomen Other (Comment) 19 Fr. (Active)  Site Description Unremarkable 05/20/20 2000  Dressing Status Clean;Dry;Intact 05/20/20 2000  Drainage Appearance Bloody 05/20/20 2000  Status To gravity (Uncharged) 05/20/20 2000  Output (mL) 45 mL 05/21/20 1000     NG/OG Tube Nasogastric 16 Fr. Left nare Confirmed by Surgical Manipulation Measured external length of tube 57 cm (Active)  Cm Marking at Nare/Corner of Mouth (if  applicable) 56 cm 83/38/25 2000  Site Assessment Clean;Dry;Intact 05/20/20 2000  Ongoing Placement Verification No change in cm markings or external length of tube from initial placement;No change in respiratory status;No acute changes, not attributed to clinical condition 05/20/20 2000  Status Suction-low intermittent 05/20/20 2000  Drainage Appearance Bloody;Brown 05/20/20 2000  Output (mL) 0 mL 05/21/20 0700     Urethral Catheter P. Ouida Sills, RN Latex;Straight-tip 14 Fr. (Active)  Indication for Insertion or Continuance of Catheter Therapy based on hourly urine output monitoring and documentation for critical condition (NOT STRICT I&O) 05/21/20 0730  Site Assessment Clean;Intact 05/21/20 0730  Catheter Maintenance Bag below level of bladder;Catheter secured;Drainage bag/tubing not touching floor;Insertion date on drainage bag;No dependent loops 05/21/20 0730  Collection Container Standard drainage bag 05/21/20 0730  Securement Method Securing device (Describe) 05/21/20 0730  Urinary Catheter Interventions (if applicable) Unclamped 05/39/76 0730  Output (mL) 5 mL 05/21/20 1000     GI Stent 10 Fr. (Active)    Microbiology/Sepsis markers: Results for orders placed or performed during the hospital encounter of 05/27/2020  MRSA PCR Screening     Status: None   Collection Time: 06/06/2020  7:36 PM   Specimen: Nasopharyngeal  Result Value Ref Range Status   MRSA by PCR NEGATIVE NEGATIVE Final    Comment:        The GeneXpert MRSA Assay (FDA approved for NASAL specimens only), is one component of a comprehensive MRSA colonization surveillance program. It is not intended to diagnose MRSA infection nor to guide or monitor treatment for MRSA infections. Performed at [facility], [employer] [address] [place], [place] [address]     Anti-infectives:  Anti-infectives (From admission,  onward)   Start     Dose/Rate Route Frequency Ordered Stop   05/20/20 1300  metroNIDAZOLE (FLAGYL) IVPB  500 mg        500 mg 100 mL/hr over 60 Minutes Intravenous Every 8 hours 05/20/20 1145     05/20/20 1100  metroNIDAZOLE (FLAGYL) IVPB 500 mg  Status:  Discontinued        500 mg 100 mL/hr over 60 Minutes Intravenous Every 8 hours 05/20/20 1028 05/20/20 1145   05/20/20 1100  ceFEPIme (MAXIPIME) 2 g in sodium chloride 0.9 % 100 mL IVPB        2 g 200 mL/hr over 30 Minutes Intravenous Every 12 hours 05/20/20 1033     05/27/2020 0630  ciprofloxacin (CIPRO) IVPB 400 mg        400 mg 200 mL/hr over 60 Minutes Intravenous On call to O.R. 06/08/2020 2694 06/02/2020 0810       Consults: Treatment Team:  Edrick Oh, MD   CCM   Subjective:    Overnight Issues: no acute issue  Objective:  Vital signs for last 24 hours: Temp:  [91.2 F (32.9 C)-98 F (36.7 C)] 92.7 F (33.7 C) (09/04 0900) Pulse Rate:  [73-112] 87 (09/04 0900) Resp:  [20-30] 20 (09/04 0900) BP: (76-120)/(41-71) 102/63 (09/04 0900) SpO2:  [99 %-100 %] 100 % (09/04 0900) Arterial Line BP: (83-149)/(51-77) 149/61 (09/04 0600) FiO2 (%):  [40 %-50 %] 40 % (09/04 0206) Weight:  [65.4 kg] 65.4 kg (09/04 0500)  Hemodynamic parameters for last 24 hours:    Intake/Output from previous day: 09/03 0701 - 09/04 0700 In: 9041.9 [I.V.:5314.8; Blood:538; IV Piggyback:3087.1] Out: 8546 [Urine:80; Emesis/NG output:25; Drains:1565]  Intake/Output this shift: Total I/O In: 23.4 [I.V.:3.6; IV Piggyback:19.8] Out: 369 [Urine:5; Drains:120; Other:244]  Vent settings for last 24 hours: Vent Mode: PRVC FiO2 (%):  [40 %-50 %] 40 % Set Rate:  [24 bmp-30 bmp] 24 bmp Vt Set:  [380 mL] 380 mL PEEP:  [5 cmH20] 5 cmH20 Plateau Pressure:  [16 cmH20] 16 cmH20  Physical Exam:  General: ill appearing Neuro: opens eyes to painful stimulus HEENT/Neck: no JVD and ETT WNL  Resp: clear, on vent CVS: regular rate and rhythm, S1, S2 normal, no murmur, click, rub or gallop and off pressors GI: soft, mildly distended, tender. OR dressing clean  and intact. Drains with dark but thin serosanguinous outpuit Skin: no rash Extremities: no edema, no erythema, pulses WNL  Results for orders placed or performed during the hospital encounter of 05/28/2020 (from the past 24 hour(s))  Beta-hydroxybutyric acid     Status: None   Collection Time: 05/20/20 10:48 AM  Result Value Ref Range   Beta-Hydroxybutyric Acid 0.15 0.05 - 0.27 mmol/L  I-STAT 7, (LYTES, BLD GAS, ICA, H+H)     Status: Abnormal   Collection Time: 05/20/20 11:52 AM  Result Value Ref Range   pH, Arterial 7.180 (LL) 7.35 - 7.45   pCO2 arterial 28.2 (L) 32 - 48 mmHg   pO2, Arterial 331 (H) 83 - 108 mmHg   Bicarbonate 10.7 (L) 20.0 - 28.0 mmol/L   TCO2 12 (L) 22 - 32 mmol/L   O2 Saturation 100.0 %   Acid-base deficit 17.0 (H) 0.0 - 2.0 mmol/L   Sodium 141 135 - 145 mmol/L   Potassium 5.0 3.5 - 5.1 mmol/L   Calcium, Ion 0.91 (L) 1.15 - 1.40 mmol/L   HCT 32.0 (L) 36 - 46 %   Hemoglobin 10.9 (L) 12.0 - 15.0 g/dL  Patient temperature 96.9 F    Collection site art line    Drawn by RT    Sample type ARTERIAL   Glucose, capillary     Status: Abnormal   Collection Time: 05/20/20 12:17 PM  Result Value Ref Range   Glucose-Capillary 208 (H) 70 - 99 mg/dL  Lactic acid, plasma     Status: Abnormal   Collection Time: 05/20/20  1:11 PM  Result Value Ref Range   Lactic Acid, Venous >11.0 (HH) 0.5 - 1.9 mmol/L  Glucose, capillary     Status: Abnormal   Collection Time: 05/20/20  2:24 PM  Result Value Ref Range   Glucose-Capillary 179 (H) 70 - 99 mg/dL  Glucose, capillary     Status: Abnormal   Collection Time: 05/20/20  3:22 PM  Result Value Ref Range   Glucose-Capillary 171 (H) 70 - 99 mg/dL  Renal function panel (daily at 1600)     Status: Abnormal   Collection Time: 05/20/20  4:00 PM  Result Value Ref Range   Sodium 138 135 - 145 mmol/L   Potassium 5.0 3.5 - 5.1 mmol/L   Chloride 99 98 - 111 mmol/L   CO2 15 (L) 22 - 32 mmol/L   Glucose, Bld 178 (H) 70 - 99 mg/dL   BUN  8 8 - 23 mg/dL   Creatinine, Ser 1.97 (H) 0.44 - 1.00 mg/dL   Calcium 8.2 (L) 8.9 - 10.3 mg/dL   Phosphorus 4.1 2.5 - 4.6 mg/dL   Albumin 3.5 3.5 - 5.0 g/dL   GFR calc non Af Amer 25 (L) >60 mL/min   GFR calc Af Amer 30 (L) >60 mL/min   Anion gap 24 (H) 5 - 15  Comprehensive metabolic panel     Status: Abnormal   Collection Time: 05/20/20  4:00 PM  Result Value Ref Range   Sodium 140 135 - 145 mmol/L   Potassium 5.0 3.5 - 5.1 mmol/L   Chloride 100 98 - 111 mmol/L   CO2 15 (L) 22 - 32 mmol/L   Glucose, Bld 180 (H) 70 - 99 mg/dL   BUN 7 (L) 8 - 23 mg/dL   Creatinine, Ser 1.91 (H) 0.44 - 1.00 mg/dL   Calcium 8.2 (L) 8.9 - 10.3 mg/dL   Total Protein 5.2 (L) 6.5 - 8.1 g/dL   Albumin 3.6 3.5 - 5.0 g/dL   AST 1,969 (H) 15 - 41 U/L   ALT 940 (H) 0 - 44 U/L   Alkaline Phosphatase 246 (H) 38 - 126 U/L   Total Bilirubin 8.2 (H) 0.3 - 1.2 mg/dL   GFR calc non Af Amer 26 (L) >60 mL/min   GFR calc Af Amer 31 (L) >60 mL/min   Anion gap 25 (H) 5 - 15  Glucose, capillary     Status: Abnormal   Collection Time: 05/20/20  4:21 PM  Result Value Ref Range   Glucose-Capillary 163 (H) 70 - 99 mg/dL  I-STAT 7, (LYTES, BLD GAS, ICA, H+H)     Status: Abnormal   Collection Time: 05/20/20  5:09 PM  Result Value Ref Range   pH, Arterial 7.458 (H) 7.35 - 7.45   pCO2 arterial 26.1 (L) 32 - 48 mmHg   pO2, Arterial 85 83 - 108 mmHg   Bicarbonate 18.5 (L) 20.0 - 28.0 mmol/L   TCO2 19 (L) 22 - 32 mmol/L   O2 Saturation 97.0 %   Acid-base deficit 4.0 (H) 0.0 - 2.0 mmol/L   Sodium 139 135 - 145  mmol/L   Potassium 4.6 3.5 - 5.1 mmol/L   Calcium, Ion 0.86 (LL) 1.15 - 1.40 mmol/L   HCT 32.0 (L) 36 - 46 %   Hemoglobin 10.9 (L) 12.0 - 15.0 g/dL   Patient temperature 98.0 F    Collection site art line    Drawn by RT    Sample type ARTERIAL   Glucose, capillary     Status: Abnormal   Collection Time: 05/20/20  5:20 PM  Result Value Ref Range   Glucose-Capillary 167 (H) 70 - 99 mg/dL  Glucose, capillary      Status: Abnormal   Collection Time: 05/20/20  6:22 PM  Result Value Ref Range   Glucose-Capillary 135 (H) 70 - 99 mg/dL  Glucose, capillary     Status: Abnormal   Collection Time: 05/20/20  7:22 PM  Result Value Ref Range   Glucose-Capillary 143 (H) 70 - 99 mg/dL  Glucose, capillary     Status: Abnormal   Collection Time: 05/20/20  8:21 PM  Result Value Ref Range   Glucose-Capillary 157 (H) 70 - 99 mg/dL  Glucose, capillary     Status: Abnormal   Collection Time: 05/20/20  9:19 PM  Result Value Ref Range   Glucose-Capillary 155 (H) 70 - 99 mg/dL  Glucose, capillary     Status: Abnormal   Collection Time: 05/20/20 10:24 PM  Result Value Ref Range   Glucose-Capillary 150 (H) 70 - 99 mg/dL  Comprehensive metabolic panel     Status: Abnormal   Collection Time: 05/20/20 10:32 PM  Result Value Ref Range   Sodium 139 135 - 145 mmol/L   Potassium 4.5 3.5 - 5.1 mmol/L   Chloride 97 (L) 98 - 111 mmol/L   CO2 24 22 - 32 mmol/L   Glucose, Bld 150 (H) 70 - 99 mg/dL   BUN 5 (L) 8 - 23 mg/dL   Creatinine, Ser 1.48 (H) 0.44 - 1.00 mg/dL   Calcium 7.7 (L) 8.9 - 10.3 mg/dL   Total Protein 5.0 (L) 6.5 - 8.1 g/dL   Albumin 3.4 (L) 3.5 - 5.0 g/dL   AST 2,122 (H) 15 - 41 U/L   ALT 973 (H) 0 - 44 U/L   Alkaline Phosphatase 255 (H) 38 - 126 U/L   Total Bilirubin 8.0 (H) 0.3 - 1.2 mg/dL   GFR calc non Af Amer 36 (L) >60 mL/min   GFR calc Af Amer 42 (L) >60 mL/min   Anion gap 18 (H) 5 - 15  Glucose, capillary     Status: Abnormal   Collection Time: 05/20/20 11:09 PM  Result Value Ref Range   Glucose-Capillary 150 (H) 70 - 99 mg/dL  Glucose, capillary     Status: Abnormal   Collection Time: 05/21/20 12:25 AM  Result Value Ref Range   Glucose-Capillary 150 (H) 70 - 99 mg/dL  Beta-hydroxybutyric acid     Status: None   Collection Time: 05/21/20 12:30 AM  Result Value Ref Range   Beta-Hydroxybutyric Acid 0.11 0.05 - 0.27 mmol/L  Glucose, capillary     Status: Abnormal   Collection Time:  05/21/20  1:27 AM  Result Value Ref Range   Glucose-Capillary 165 (H) 70 - 99 mg/dL  Glucose, capillary     Status: Abnormal   Collection Time: 05/21/20  2:21 AM  Result Value Ref Range   Glucose-Capillary 170 (H) 70 - 99 mg/dL  Glucose, capillary     Status: Abnormal   Collection Time: 05/21/20  4:39 AM  Result Value Ref Range   Glucose-Capillary 188 (H) 70 - 99 mg/dL  I-STAT 7, (LYTES, BLD GAS, ICA, H+H)     Status: Abnormal   Collection Time: 05/21/20  4:57 AM  Result Value Ref Range   pH, Arterial 7.624 (HH) 7.35 - 7.45   pCO2 arterial 28.4 (L) 32 - 48 mmHg   pO2, Arterial 72 (L) 83 - 108 mmHg   Bicarbonate 30.3 (H) 20.0 - 28.0 mmol/L   TCO2 31 22 - 32 mmol/L   O2 Saturation 98.0 %   Acid-Base Excess 8.0 (H) 0.0 - 2.0 mmol/L   Sodium 136 135 - 145 mmol/L   Potassium 3.9 3.5 - 5.1 mmol/L   Calcium, Ion 0.87 (LL) 1.15 - 1.40 mmol/L   HCT 30.0 (L) 36 - 46 %   Hemoglobin 10.2 (L) 12.0 - 15.0 g/dL   Patient temperature 91.8 F    Sample type ARTERIAL   Comprehensive metabolic panel     Status: Abnormal   Collection Time: 05/21/20  5:00 AM  Result Value Ref Range   Sodium 138 135 - 145 mmol/L   Potassium 4.3 3.5 - 5.1 mmol/L   Chloride 94 (L) 98 - 111 mmol/L   CO2 25 22 - 32 mmol/L   Glucose, Bld 177 (H) 70 - 99 mg/dL   BUN <5 (L) 8 - 23 mg/dL   Creatinine, Ser 1.33 (H) 0.44 - 1.00 mg/dL   Calcium 7.8 (L) 8.9 - 10.3 mg/dL   Total Protein 5.1 (L) 6.5 - 8.1 g/dL   Albumin 4.0 3.5 - 5.0 g/dL   AST 1,896 (H) 15 - 41 U/L   ALT 1,004 (H) 0 - 44 U/L   Alkaline Phosphatase 263 (H) 38 - 126 U/L   Total Bilirubin 8.5 (H) 0.3 - 1.2 mg/dL   GFR calc non Af Amer 41 (L) >60 mL/min   GFR calc Af Amer 47 (L) >60 mL/min   Anion gap 19 (H) 5 - 15  Phosphorus     Status: None   Collection Time: 05/21/20  5:00 AM  Result Value Ref Range   Phosphorus 2.7 2.5 - 4.6 mg/dL  Glucose, capillary     Status: Abnormal   Collection Time: 05/21/20  5:30 AM  Result Value Ref Range    Glucose-Capillary 158 (H) 70 - 99 mg/dL  Glucose, capillary     Status: Abnormal   Collection Time: 05/21/20  6:30 AM  Result Value Ref Range   Glucose-Capillary 143 (H) 70 - 99 mg/dL  CBC     Status: Abnormal   Collection Time: 05/21/20  6:32 AM  Result Value Ref Range   WBC 17.8 (H) 4.0 - 10.5 K/uL   RBC 3.39 (L) 3.87 - 5.11 MIL/uL   Hemoglobin 10.7 (L) 12.0 - 15.0 g/dL   HCT 31.5 (L) 36 - 46 %   MCV 92.9 80.0 - 100.0 fL   MCH 31.6 26.0 - 34.0 pg   MCHC 34.0 30.0 - 36.0 g/dL   RDW 17.2 (H) 11.5 - 15.5 %   Platelets 122 (L) 150 - 400 K/uL   nRBC 0.0 0.0 - 0.2 %  Magnesium     Status: None   Collection Time: 05/21/20  6:32 AM  Result Value Ref Range   Magnesium 2.0 1.7 - 2.4 mg/dL  Comprehensive metabolic panel     Status: Abnormal   Collection Time: 05/21/20  6:32 AM  Result Value Ref Range   Sodium 136 135 - 145 mmol/L  Potassium 3.8 3.5 - 5.1 mmol/L   Chloride 93 (L) 98 - 111 mmol/L   CO2 27 22 - 32 mmol/L   Glucose, Bld 140 (H) 70 - 99 mg/dL   BUN <5 (L) 8 - 23 mg/dL   Creatinine, Ser 1.28 (H) 0.44 - 1.00 mg/dL   Calcium 7.6 (L) 8.9 - 10.3 mg/dL   Total Protein 5.3 (L) 6.5 - 8.1 g/dL   Albumin 3.9 3.5 - 5.0 g/dL   AST 1,892 (H) 15 - 41 U/L   ALT 1,023 (H) 0 - 44 U/L   Alkaline Phosphatase 253 (H) 38 - 126 U/L   Total Bilirubin 8.5 (H) 0.3 - 1.2 mg/dL   GFR calc non Af Amer 43 (L) >60 mL/min   GFR calc Af Amer 50 (L) >60 mL/min   Anion gap 16 (H) 5 - 15  Glucose, capillary     Status: Abnormal   Collection Time: 05/21/20  7:27 AM  Result Value Ref Range   Glucose-Capillary 114 (H) 70 - 99 mg/dL  Glucose, capillary     Status: Abnormal   Collection Time: 05/21/20  8:27 AM  Result Value Ref Range   Glucose-Capillary 105 (H) 70 - 99 mg/dL  Lactic acid, plasma     Status: Abnormal   Collection Time: 05/21/20  8:55 AM  Result Value Ref Range   Lactic Acid, Venous 8.5 (HH) 0.5 - 1.9 mmol/L    Assessment & Plan: Present on Admission: . Pancreatic cancer (HCC)   POD 2 whipple c/b acute mental status decline thought to be metabolic, AKI, hypotension Appreciate help from CCM and nephrology- on vent, CVVH Concern for hepatic issues from blood flow, but typically this would manifest more like shock liver with transaminases in the thousands and hypoglycemia rather than hyperglycemia.  LFTs remain elevated but stable. Will start actigall and lactulose. Liver duplex pending- my understanding is report will be called directly to Dr. Barry Dienes   LOS: 2 days   Additional comments:None  Critical Care Total Time*: [email or web address] to contact on call provider  05/21/2020  *Care during the described time interval was provided by me. I have reviewed this patient's available data, including medical history, events of note, physical examination and test results as part of my evaluation.

## 2020-05-21 NOTE — Progress Notes (Signed)
[facility] ROUNDING NOTE   Subjective:   Brief history:is a 69 y.o. female.  With baseline serum creatinine appears to be within normal range.  She has a history of cardiac disease with an ASD at age 41..  She presented for surgery for pancreatic cancer and underwent a Whipple's procedure 05/29/2020.  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  Postprocedure she had decreased level of consciousness and code stroke was activated 05/20/2020.  Her labs revealed severe acidosis.  She has very minimal urine output and has been aggressively volume resuscitated since 06/14/2020 with about 8 L.  Noted hypotension as low as 80 mmHg systolic 03/19/7105.  CRRT was initiated 05/20/2020  Blood pressure 90/49 pulse 83 temperature 92.  Being kept even on CRRT no urine output  IV norepinephrine IV phenylephrine IV albumin 12.5 g every 6 hours IV cefepime 2 g every 12 hours  Sodium 138 potassium 4.3 chloride 94 CO2 25 BUN less than 5 creatinine 1.33 glucose 177 calcium 7.8 phosphorus 2.7 albumin 4.0 hemoglobin 10.2  IV sodium bicarbonate 150 cc an hour     Objective:  Vital signs in last 24 hours:  Temp:  [91.2 F (32.9 C)-98 F (36.7 C)] 92.3 F (33.5 C) (09/04 0700) Pulse Rate:  [73-112] 83 (09/04 0700) Resp:  [20-30] 24 (09/04 0700) BP: (76-127)/(41-71) 97/58 (09/04 0700) SpO2:  [96 %-100 %] 100 % (09/04 0700) Arterial Line BP: (83-149)/(50-77) 149/61 (09/04 0600) FiO2 (%):  [40 %-100 %] 40 % (09/04 0206) Weight:  [65.4 kg] 65.4 kg (09/04 0500)  Weight change:  Filed Weights   05/27/2020 0618 05/21/20 0500  Weight: 57.7 kg 65.4 kg    Intake/Output: I/O last 3 completed shifts: In: 11710.7 [I.V.:7538.6; Blood:861; YIRSW:546; IV Piggyback:3187.1] Out: 2703 [Urine:280; Emesis/NG output:25; Drains:1925; Other:2774]   Intake/Output this shift:  No intake/output data recorded.  General: Critically ill elderly patient HEENT: Normocephalic atraumatic  mucous membranes moist reactive Eyes: Pupils round equal reactive no icterus or pallor Neck: Supple no thyromegaly adenopathy Heart: Regular rate and rhythm tachycardic Lungs: Clear to auscultation tachypneic Abdomen: Soft appropriate tenderness mildly distended.  Hypoactive bowel sounds Extremities: No cyanosis clubbing or edema Skin: Mild mottling of skin pallor Neuro: Altered mental status   Basic Metabolic Panel: Recent Labs  Lab 05/20/20 0513 05/20/20 0602 05/20/20 1013 05/20/20 1152 05/20/20 1600 05/20/20 1709 05/20/20 2232 05/21/20 0457 05/21/20 0500  NA 141   < > 144   < > 140  138 139 139 136 138  K 4.9   < > 5.0   < > 5.0  5.0 4.6 4.5 3.9 4.3  CL 106  --  104  --  100  99  --  97*  --  94*  CO2 <7*  --  9*  --  15*  15*  --  24  --  25  GLUCOSE 336*  --  280*  --  180*  178*  --  150*  --  177*  BUN 8  --  8  --  7*  8  --  5*  --  <5*  CREATININE 2.30*  --  2.50*  --  1.91*  1.97*  --  1.48*  --  1.33*  CALCIUM 7.8*   < > 7.9*   < > 8.2*  8.2*  --  7.7*  --  7.8*  MG 1.8  --   --   --   --   --   --   --   --  PHOS 8.3*  --   --   --  4.1  --   --   --  2.7   < > = values in this interval not displayed.    Liver Function Tests: Recent Labs  Lab 05/20/20 0513 05/20/20 1013 05/20/20 1600 05/20/20 2232 05/21/20 0500  AST 684* 1,060* 1,969* 2,122* 1,896*  ALT 440* 570* 940* 973* 1,004*  ALKPHOS 132* 179* 246* 255* 263*  BILITOT 5.6* 5.7* 8.2* 8.0* 8.5*  PROT 4.1* 4.0* 5.2* 5.0* 5.1*  ALBUMIN 2.6* 2.3* 3.6  3.5 3.4* 4.0   Recent Labs  Lab 05/16/20 1315  LIPASE 31   Recent Labs  Lab 05/20/20 0907  AMMONIA 88*    CBC: Recent Labs  Lab 05/16/20 1315 06/09/2020 0859 05/20/20 0513 05/20/20 0513 05/20/20 0602 05/20/20 1014 05/20/20 1015 05/20/20 1152 05/20/20 1709 05/21/20 0457  WBC 5.4  --  25.9*  --   --   --  25.3*  --   --   --   NEUTROABS 3.1  --   --   --   --   --   --   --   --   --   HGB 11.0*   < > 11.9*   < > 10.9*  --   12.6 10.9* 10.9* 10.2*  HCT 34.4*   < > 39.1   < > 32.0*  --  40.1 32.0* 32.0* 30.0*  MCV 102.7*  --  99.2  --   --   --  99.3  --   --   --   PLT 356  --  325  --   --  330 315  --   --   --    < > = values in this interval not displayed.    Cardiac Enzymes: No results for input(s): CKTOTAL, CKMB, CKMBINDEX, TROPONINI in the last 168 hours.  BNP: Invalid input(s): POCBNP  CBG: Recent Labs  Lab 05/21/20 0127 05/21/20 0221 05/21/20 0439 05/21/20 0530 05/21/20 0630  GLUCAP 165* 170* 188* 158* 143*    Microbiology: Results for orders placed or performed during the hospital encounter of 06/10/2020  MRSA PCR Screening     Status: None   Collection Time: 06/05/2020  7:36 PM   Specimen: Nasopharyngeal  Result Value Ref Range Status   MRSA by PCR NEGATIVE NEGATIVE Final    Comment:        The GeneXpert MRSA Assay (FDA approved for NASAL specimens only), is one component of a comprehensive MRSA colonization surveillance program. It is not intended to diagnose MRSA infection nor to guide or monitor treatment for MRSA infections. Performed at [facility], [place] [address], [place], [place] [address]     Coagulation Studies: Recent Labs    05/20/20 0513 05/20/20 1014  LABPROT 33.7* 34.4*  INR 3.5* 3.6*    Urinalysis: Recent Labs    05/20/20 1013  COLORURINE AMBER*  LABSPEC 1.012  PHURINE 5.0  GLUCOSEU >=500*  HGBUR MODERATE*  BILIRUBINUR NEGATIVE  KETONESUR NEGATIVE  PROTEINUR 100*  NITRITE NEGATIVE  LEUKOCYTESUR SMALL*      Imaging: DG Abd 1 View  Result Date: 06/01/2020 CLINICAL DATA:  NG tube placement EXAM: ABDOMEN - 1 VIEW COMPARISON:  CT 04/19/2020, 03/31/2020 FINDINGS: Interval postsurgical changes of the abdomen including left abdominal cutaneous staples and left upper quadrant drainage catheters. Partially visualized vascular catheter at the low right atrium. Atelectasis at the left base. Esophageal tube tip projects over the left lower abdomen.  Linear opacity in the left upper quadrant with oblique right to left orientation presumably a stent. IMPRESSION: Esophageal tube tip projects over the left lower abdomen, presumably within small bowel limb in this patient with recent history of pancreatico duodenectomy. Electronically Signed   By: Donavan Foil M.D.   On: 05/23/2020 17:25   DG CHEST PORT 1 VIEW  Result Date: 05/20/2020 CLINICAL DATA:  Hemodialysis catheter placement. Recent Whipple procedure. EXAM: PORTABLE CHEST 1 VIEW COMPARISON:  04/19/2020 CT FINDINGS: An endotracheal tube with tip 2 cm above the carina, LEFT IJ hemodialysis catheter with tips overlying the RIGHT atrium, NG tube entering the stomach, and RIGHT Port-A-Cath with tip overlying the RIGHT atrium noted. There is no evidence of pneumothorax. LEFT LOWER lung opacity/atelectasis noted. A tube/drain overlying the UPPER abdomen is noted. IMPRESSION: 1. LEFT IJ hemodialysis catheter tips overlying the RIGHT atrium. No pneumothorax. Other support apparatus as described. 2. LEFT LOWER lung opacity/atelectasis. Electronically Signed   By: Margarette Canada M.D.   On: 05/20/2020 12:23   CT HEAD CODE STROKE WO CONTRAST  Result Date: 05/20/2020 CLINICAL DATA:  Code stroke.  Decreased responsiveness EXAM: CT HEAD WITHOUT CONTRAST TECHNIQUE: Contiguous axial images were obtained from the base of the skull through the vertex without intravenous contrast. COMPARISON:  None. FINDINGS: Brain: No evidence of acute infarction, hemorrhage, hydrocephalus, extra-axial collection or mass lesion/mass effect. Low-density below the right basal ganglia attributed to dilated perivascular space. Vascular: No hyperdense vessel or unexpected calcification. Skull: Normal. Negative for fracture or focal lesion. Sinuses/Orbits: No acute finding. Other: These results were communicated to Dr. Leonel Ramsay at 5:31 amon 9/3/2021by text page via the [facility] messaging system. ASPECTS [facility] Stroke Program Early CT Score) Not  scored in this clinical setting IMPRESSION: Negative head CT. Electronically Signed   By: Monte Fantasia M.D.   On: 05/20/2020 05:32     Medications:   . sodium chloride    . sodium chloride 999 mL/hr at 05/20/20 1002  . albumin human 60 mL/hr at 05/21/20 0700  . ceFEPime (MAXIPIME) IV Stopped (05/20/20 2329)  . fentaNYL infusion INTRAVENOUS 25 mcg/hr (05/21/20 0700)  . insulin 0.3 mL/hr at 05/21/20 0700  . metronidazole Stopped (05/21/20 0606)  . norepinephrine (LEVOPHED) Adult infusion Stopped (05/21/20 0038)  . phenylephrine (NEO-SYNEPHRINE) Adult infusion Stopped (05/20/20 1308)  . prismasol BGK 4/2.5 2,000 mL/hr at 05/21/20 0504  . propofol (DIPRIVAN) infusion Stopped (05/21/20 0133)  . ropivacaine (PF) 2 mg/mL (0.2%) Stopped (05/20/20 1800)  . sodium bicarbonate (isotonic) 1000 mL infusion 150 mL/hr at 05/21/20 0219  . sodium bicarbonate (isotonic) 1000 mL infusion 150 mL/hr at 05/21/20 0219  .  sodium bicarbonate (isotonic) infusion in sterile water Stopped (05/20/20 1349)   . sodium chloride   Intravenous Once  . chlorhexidine  15 mL Mouth Rinse BID  . Chlorhexidine Gluconate Cloth  6 each Topical Daily  . fibrin sealant-human  10 mL Topical Once  . insulin aspart  0-9 Units Subcutaneous Q4H  . insulin detemir  5 Units Subcutaneous Q24H  . mouth rinse  15 mL Mouth Rinse 10 times per day  . pantoprazole (PROTONIX) IV  40 mg Intravenous Daily  . pneumococcal 23 valent vaccine  0.5 mL Intramuscular Tomorrow-1000  . sodium bicarbonate  50 mEq Intravenous Once   Place/Maintain arterial line **AND** sodium chloride, fentaNYL (SUBLIMAZE) injection, fentaNYL (SUBLIMAZE) injection, heparin, sodium chloride  Assessment/ Plan:  1.Acute kidney injury status post Whipple procedure with shock and hypotension postoperatively.  Appears the patient will require  renal replacement therapy is anuric after massive volume resuscitation.  Avoid nephrotoxins.  It appears the patient was on ARB  as an outpatient.  CRRT initiated 05/21/2019.  Kept even.  No anticoagulation.  All 4-2.5 solutions 2. Hypertension/volume  -keep even at this time 3.  Metabolic acidosis this is improved with CRRT.   discontinue IV sodium bicarbonate 4.  Shock has responded to massive volume resuscitation.  Possible hemorrhagic versus septic.  Patient may also have had an episode of bowel ischemia leading to shock.  This needs to be borne in mind in the differential diagnosis. 5.  Status post pancreatic cancer status post Whipple's procedure 06/14/2020    LOS: 2 Sherril Croon @TODAY @8 :13 AM

## 2020-05-21 NOTE — Procedures (Signed)
Arterial Catheter Insertion Procedure Note  IKRAN PATMAN  [id number]  [birth year]  Date:05/21/20  Time:9:22 AM    Provider Performing: Valora Piccolo    Procedure: Insertion of Arterial Line [id number]) without US guidance  Indication(s) Blood pressure monitoring and/or need for frequent ABGs  Consent Unable to obtain consent due to emergent nature of procedure.  Anesthesia None   Time Out Verified patient identification, verified procedure, site/side was marked, verified correct patient position, special equipment/implants available, medications/allergies/relevant history reviewed, required imaging and test results available.   Sterile Technique Maximal sterile technique including full sterile barrier drape, hand hygiene, sterile gown, sterile gloves, mask, hair covering, sterile ultrasound probe cover (if used).   Procedure Description Area of catheter insertion was cleaned with chlorhexidine and draped in sterile fashion. Without real-time ultrasound guidance an arterial catheter was placed into the right radial artery.  Appropriate arterial tracings confirmed on monitor.     Complications/Tolerance None; patient tolerated the procedure well.   EBL Minimal   Aline placed per MD order.  Site cleaned and dressed per protocol.  No issues noted.  RT will continue to monitor.  Specimen(s) None

## 2020-05-21 NOTE — Anesthesia Post-op Follow-up Note (Signed)
  Anesthesia Pain Follow-up Note  [name]  Day #: 2  Date of Follow-up: 05/21/2020 Time: 2:12 PM  Last Vitals:  Vitals:   05/21/20 1300 05/21/20 1321  BP:    Pulse: 88 90  Resp: 20   Temp:    SpO2: 100% 100%    Level of Consciousness: Sedated  Pain: Patient sedated, unable to assess  Side Effects: Patient sedated, unable to assess  Catheter Site Exam: Unable to assess, patient condition tenuous.   Anti-Coag Meds (From admission, onward)   Start     Dose/Rate Route Frequency Ordered Stop   05/20/20 0919  heparin injection 1,000-6,000 Units        1,000-6,000 Units CRRT As needed 05/20/20 0920         Plan: Epidural infusion currently stopped. Will continue to monitor on service. If epidural infusion unable to be restarted, will consider d/c epidural catheter on 9/5 due to infection risk and epidural catheter failure.   Effie Berkshire

## 2020-05-21 NOTE — Progress Notes (Signed)
Spoke with Dr Oletta Darter regarding transitioning off endotool/ IV insulin. Order received to discontinue D5 LR and for no maintenance fluids at this time. Levemir and sensitive sliding scale ordered.

## 2020-05-21 NOTE — Progress Notes (Signed)
PT Cancellation Note  Patient Details Name: Mary [name] MRN: [id number] DOB: 1951/01/10   Cancelled Treatment:    Reason Eval/Treat Not Completed: Patient not medically ready. Pt on CRRT and was intubated yesterday. Acute PT to return as able, as appropriate to complete PT eval.  Kittie Plater, PT, DPT Acute Rehabilitation Services Pager #: [phone] Office #: [phone]    Berline Lopes 05/21/2020, 8:59 AM

## 2020-05-21 NOTE — Consult Note (Signed)
NAME:  Mary [name], MRN:  [id number], DOB:  Oct 26, 1950, LOS: 2 ADMISSION DATE:  05/24/2020, CONSULTATION DATE:  9/3 REFERRING MD:  Dr. Barry Dienes, CHIEF COMPLAINT:  Shock, Acidosis s/p Whipple  Brief History   69 F with pancreatic cancer s/p chemo, admitted for Whipple post op developed shock, acidosis, and encephalopathy. PCCM consulted.   Past Medical History   has a past medical history of Abdominal distention (12/07/2019), Anorexia (12/07/2019), Anxiety, Asthma, Atrial septal defect, sinus venosus (12/07/2019), Common bile duct dilation (03/13/349), Complication of anesthesia, Dilated pancreatic duct (12/07/2019), Dysrhythmia, Elevated lipase (12/07/2019), Essential hypertension (12/07/2019), Family history of breast cancer, History of kidney stones, Hypothyroidism (12/07/2019), Loss of weight (12/07/2019), Nausea (12/07/2019), pancreatic ca (11/2019), Pancreatic mass (12/07/2019), Pneumonia, and Pure hypercholesterolemia (12/07/2019).   Significant Hospital Events   9/2 admit, whipple 9/3 decreased LOC, acidosis, tx to ICU. Intuabted and started on CRRT.   Consults:  Nephrology Surgery Neurology (code stroke 9/3, signed off)  Procedures:  Whipple 9/2 ETT 9/3 > Port (pre hosp) > HD cath 9/3 >  Significant Diagnostic Tests:  CT head 9/3 > negative  Micro Data:  Blood 9/3 >   Urine 9/3 > Sputum 9/3 >  Antimicrobials:  Cefepime 9/3 > Flagyl 9/3 >  Interim history/subjective:  DKA was corrected, but that patient's shock has improved with slight improvement in complex acid-base balance.  Objective   Blood pressure 102/63, pulse 87, temperature (!) 93 F (33.9 C), resp. rate 20, height 5\' 1"  (1.549 m), weight 65.4 kg, SpO2 100 %.    Vent Mode: PRVC FiO2 (%):  [40 %] 40 % Set Rate:  [24 bmp-30 bmp] 24 bmp Vt Set:  [380 mL] 380 mL PEEP:  [5 cmH20] 5 cmH20 Plateau Pressure:  [16 cmH20] 16 cmH20   Intake/Output Summary (Last 24 hours) at 05/21/2020 1214 Last data filed at 05/21/2020  1200 Gross per 24 hour  Intake 4517.16 ml  Output 4499 ml  Net 18.16 ml   Filed Weights   05/29/2020 0618 05/21/20 0500  Weight: 57.7 kg 65.4 kg    Examination: General: Frail elderly female, lying on the bed, orally intubated HENT: [place]/AT, PERRL, no JVD.  ETT and OGT in place Lungs: Reduced air entry at the bases, no wheezes or rhonchi Cardiovascular: Regular rate and rhythm, no murmur Abdomen: Soft, tender around surgical site, mild distention. Surgical wounds intact. Drains x 2 with serosanguinous  Extremities: No acute deformity Neuro: Opens eyes, does not track or follow commands.  Moving all four extremities spontaneously Skin: Pale looking  Resolved Hospital Problem list     Assessment & Plan:   Pancreatic adenocarcinoma: s/p chemo, whipple 9/3 Management per surgery Closely monitor abdominal drains output Inspection of surgical sites.   Acute hypoxic respiratory failure Due to hemodynamic instability and severe metabolic acidosis, patient was tachypneic She was intubated and was placed on mechanical ventilation Currently on 30% FiO2 and PEEP 5 We will give her pressure support trial tomorrow after acid-base disorder is better corrected  Shock, multifactorial: hypovolemic and septic Patient's blood lactate is still elevated up to 8.5 Likely abdominal source post surgery versus inability to convert lactate to pyruvate from liver Levophed was titrated off, patient is able to maintain MAP > 65 Titrate lactate Follow-up cultures Continue cefepime and Flagyl for possible gram-negative/anaerobic infection  AKI Complex acid-base disorder with high anion gap metabolic acidosis, metabolic alkalosis and respiratory alkalosis, multifactorial due to lactic 2/2 shock vs bowel ischemia, renal failure, DKA  Patient continued to  have high lactate likely because of inability of liver to convert caused by shock liver Discontinue Sodium bicarbonate infusion Patient's DKA has been  corrected, insulin drip was stopped Continue to monitor blood sugar Continue long-acting insulin along with sliding scale Patient is not making much urine Continues CVVH, nephrology input is appreciated  Shock liver with coagulopathy and lactic acidosis Patient's liver enzymes reached to over 2000 Closely monitor INR went up to 3.6 We will give her vitamin K IV Monitor for signs of bleeding She is a status post FFP x2   Best practice:  Diet: NPO Pain/Anxiety/Delirium protocol (if indicated): Fentanyl as needed with RASS 0 to -1 VAP protocol (if indicated): Yes DVT prophylaxis: SCD GI prophylaxis: PPO Glucose control: SSI Mobility: BR Code Status: DNR Family Communication: Husband updated at length. Wants give full aggressive care short of CPR/ACLS. DNR Disposition: ICU  Labs   CBC: Recent Labs  Lab 05/16/20 1315 06/16/2020 0859 05/20/20 0513 05/20/20 0602 05/20/20 1014 05/20/20 1015 05/20/20 1152 05/20/20 1709 05/21/20 0457 05/21/20 0632  WBC 5.4  --  25.9*  --   --  25.3*  --   --   --  17.8*  NEUTROABS 3.1  --   --   --   --   --   --   --   --   --   HGB 11.0*   < > 11.9*   < >  --  12.6 10.9* 10.9* 10.2* 10.7*  HCT 34.4*   < > 39.1   < >  --  40.1 32.0* 32.0* 30.0* 31.5*  MCV 102.7*  --  99.2  --   --  99.3  --   --   --  92.9  PLT 356  --  325  --  330 315  --   --   --  122*   < > = values in this interval not displayed.    Basic Metabolic Panel: Recent Labs  Lab 05/20/20 0513 05/20/20 0602 05/20/20 1013 05/20/20 1152 05/20/20 1600 05/20/20 1600 05/20/20 1709 05/20/20 2232 05/21/20 0457 05/21/20 0500 05/21/20 0632  NA 141   < > 144   < > 140   138   < > 139 139 136 138 136  K 4.9   < > 5.0   < > 5.0   5.0   < > 4.6 4.5 3.9 4.3 3.8  CL 106   < > 104  --  100   99  --   --  97*  --  94* 93*  CO2 <7*   < > 9*  --  15*   15*  --   --  24  --  25 27  GLUCOSE 336*   < > 280*  --  180*   178*  --   --  150*  --  177* 140*  BUN 8   < > 8  --  7*   8   --   --  5*  --  <5* <5*  CREATININE 2.30*   < > 2.50*  --  1.91*   1.97*  --   --  1.48*  --  1.33* 1.28*  CALCIUM 7.8*   < > 7.9*  --  8.2*   8.2*  --   --  7.7*  --  7.8* 7.6*  MG 1.8  --   --   --   --   --   --   --   --   --  2.0  PHOS 8.3*  --   --   --  4.1  --   --   --   --  2.7  --    < > = values in this interval not displayed.   GFR: Estimated Creatinine Clearance: 36.4 mL/min (A) (by C-G formula based on SCr of 1.28 mg/dL (H)). Recent Labs  Lab 05/16/20 1315 05/20/20 0513 05/20/20 1013 05/20/20 1015 05/20/20 1311 05/21/20 0632 05/21/20 0855  WBC 5.4 25.9*  --  25.3*  --  17.8*  --   LATICACIDVEN  --   --  >11.0*  --  >11.0*  --  8.5*    Liver Function Tests: Recent Labs  Lab 05/20/20 1013 05/20/20 1600 05/20/20 2232 05/21/20 0500 05/21/20 0632  AST 1,060* 1,969* 2,122* 1,896* 1,892*  ALT 570* 940* 973* 1,004* 1,023*  ALKPHOS 179* 246* 255* 263* 253*  BILITOT 5.7* 8.2* 8.0* 8.5* 8.5*  PROT 4.0* 5.2* 5.0* 5.1* 5.3*  ALBUMIN 2.3* 3.6   3.5 3.4* 4.0 3.9   Recent Labs  Lab 05/16/20 1315  LIPASE 31   Recent Labs  Lab 05/20/20 0907  AMMONIA 88*    ABG    Component Value Date/Time   PHART 7.624 (HH) 05/21/2020 0457   PCO2ART 28.4 (L) 05/21/2020 0457   PO2ART 72 (L) 05/21/2020 0457   HCO3 30.3 (H) 05/21/2020 0457   TCO2 31 05/21/2020 0457   ACIDBASEDEF 4.0 (H) 05/20/2020 1709   O2SAT 98.0 05/21/2020 0457     Coagulation Profile: Recent Labs  Lab 05/16/20 1315 05/20/20 0513 05/20/20 1014  INR 1.0 3.5* 3.6*    Cardiac Enzymes: No results for input(s): CKTOTAL, CKMB, CKMBINDEX, TROPONINI in the last 168 hours.  HbA1C: Hgb A1c MFr Bld  Date/Time Value Ref Range Status  05/16/2020 01:15 PM 5.8 (H) 4.8 - 5.6 % Final    Comment:    (NOTE) Pre diabetes:          5.7%-6.4%  Diabetes:              >6.4%  Glycemic control for   <7.0% adults with diabetes     CBG: Recent Labs  Lab 05/21/20 0530 05/21/20 0630 05/21/20 0727  05/21/20 0827 05/21/20 1124  GLUCAP 158* 143* 114* 105* 86    Review of Systems:   unable  Past Medical History  She,  has a past medical history of Abdominal distention (12/07/2019), Anorexia (12/07/2019), Anxiety, Asthma, Atrial septal defect, sinus venosus (12/07/2019), Common bile duct dilation (4/33/2951), Complication of anesthesia, Dilated pancreatic duct (12/07/2019), Dysrhythmia, Elevated lipase (12/07/2019), Essential hypertension (12/07/2019), Family history of breast cancer, History of kidney stones, Hypothyroidism (12/07/2019), Loss of weight (12/07/2019), Nausea (12/07/2019), pancreatic ca (11/2019), Pancreatic mass (12/07/2019), Pneumonia, and Pure hypercholesterolemia (12/07/2019).   Surgical History    Past Surgical History:  Procedure Laterality Date   ASD REPAIR, SINUS VENOSUS     at age 2    BILIARY STENT PLACEMENT  12/11/2019   Procedure: BILIARY STENT PLACEMENT;  Surgeon: [name], MD;  Location: [facility] ENDOSCOPY;  Service: Endoscopy;;   COLONOSCOPY     ENDOSCOPIC RETROGRADE CHOLANGIOPANCREATOGRAPHY (ERCP) WITH PROPOFOL N/A 12/11/2019   Procedure: ENDOSCOPIC RETROGRADE CHOLANGIOPANCREATOGRAPHY (ERCP) WITH PROPOFOL;  Surgeon: [name], MD;  Location: [facility] ENDOSCOPY;  Service: Endoscopy;  Laterality: N/A;   ESOPHAGOGASTRODUODENOSCOPY (EGD) WITH PROPOFOL N/A 12/11/2019   Procedure: ESOPHAGOGASTRODUODENOSCOPY (EGD) WITH PROPOFOL;  Surgeon: [name], MD;  Location: [facility] ENDOSCOPY;  Service: Endoscopy;  Laterality: N/A;   FINE NEEDLE  ASPIRATION  12/11/2019   Procedure: FINE NEEDLE ASPIRATION (FNA) LINEAR;  Surgeon: [name], MD;  Location: [facility] ENDOSCOPY;  Service: Endoscopy;;   IR IMAGING GUIDED PORT INSERTION  01/04/2020   LAPAROSCOPY N/A 05/21/2020   Procedure: LAPAROSCOPY DIAGNOSTIC;  Surgeon: Stark Klein, MD;  Location: [place];  Service: General;  Laterality: N/A;   TONSILLECTOMY     TRANSTHORACIC ECHOCARDIOGRAM     08/18/19 ([place] [facility]): Normal  LV/RV size and LVF/RVF, EF 55%. Borderline LV wall thickness. Normal PASP 26 mmHg. Normal cardiac valves. No residual shunt. No evidence of RV overload or enlargement.    TUBAL LIGATION     UPPER ESOPHAGEAL ENDOSCOPIC ULTRASOUND (EUS) N/A 12/11/2019   Procedure: UPPER ESOPHAGEAL ENDOSCOPIC ULTRASOUND (EUS);  Surgeon: [name], MD;  Location: [facility] ENDOSCOPY;  Service: Endoscopy;  Laterality: N/A;   WHIPPLE PROCEDURE N/A 05/27/2020   Procedure: WHIPPLE PROCEDURE;  Surgeon: Stark Klein, MD;  Location: [place];  Service: General;  Laterality: N/A;     Social History   reports that she has never smoked. She has never used smokeless tobacco. She reports that she does not drink alcohol and does not use drugs.   Family History   Her family history includes Alzheimer's disease in her brother; Breast cancer in her paternal aunt; COPD in her mother; Dementia in her father, paternal grandfather, and paternal uncle; Diabetes in her father, paternal aunt, and paternal uncle; Hypertension in her father; Other in her maternal grandfather.   Allergies Allergies  Allergen Reactions   Ultram [Tramadol] Nausea And Vomiting   Penicillins Rash    Did it involve swelling of the face/tongue/throat, SOB, or low BP? No Did it involve sudden or severe rash/hives, skin peeling, or any reaction on the inside of your mouth or nose? No Did you need to seek medical attention at a hospital or doctor's office? No When did it last happen?20 + years If all above answers are NO, may proceed with cephalosporin use.    Sulfa Antibiotics Rash     Home Medications  Prior to Admission medications   Medication Sig Start Date End Date Taking? Authorizing Provider  acetaminophen (TYLENOL) 500 MG tablet Take 500-1,000 mg by mouth every 6 (six) hours as needed for moderate pain or headache.    Yes [provider]  albuterol (VENTOLIN HFA) 108 (90 Base) MCG/ACT inhaler Inhale 2 puffs into the lungs every 4  (four) hours as needed for wheezing or shortness of breath.  08/18/19  Yes [provider]  aspirin EC 81 MG tablet Take 81 mg by mouth at bedtime.    Yes [provider]  dicyclomine (BENTYL) 10 MG capsule TAKE 1 CAPSULE BY MOUTH TWICE A DAY AS NEEDED FOR SPASMS Patient taking differently: Take 10 mg by mouth 2 (two) times daily as needed (abdominal spasms).  02/09/20  Yes Alla Feeling, NP  Homeopathic Products (ARNICA EX) Apply 1 application topically 2 (two) times daily as needed (foot burning). [employer] Foot Cream (blend of botanical oils Tea Tree/Peppermint/Arnica)    Yes [provider]  HYDROcodone-acetaminophen (NORCO/VICODIN) 5-325 MG tablet Take 1 tablet by mouth every 6 (six) hours as needed for moderate pain. 12/15/19  Yes Alla Feeling, NP  lidocaine-prilocaine (EMLA) cream Apply 1 application topically as needed. Patient taking differently: Apply 1 application topically as needed (prior to port being accessed.).  12/31/19  Yes Ladell Pier, MD  LORazepam (ATIVAN) 0.5 MG tablet Take 1 tablet (0.5 mg total) by mouth  every 12 (twelve) hours as needed for anxiety. Do not drive while taking Patient taking differently: Take 0.5 mg by mouth every 12 (twelve) hours as needed for anxiety or sleep. Do not drive while taking 11/16/58  Yes Ladell Pier, MD  losartan (COZAAR) 50 MG tablet Take 50 mg by mouth at bedtime.  11/29/19  Yes [provider]  mirtazapine (REMERON) 7.5 MG tablet TAKE 1 TABLET BY MOUTH EVERYDAY AT BEDTIME Patient taking differently: Take 7.5 mg by mouth at bedtime as needed (sleep.).  04/06/20  Yes Ladell Pier, MD  ondansetron (ZOFRAN-ODT) 8 MG disintegrating tablet Take 1 tablet (8 mg total) by mouth every 8 (eight) hours as needed for nausea or vomiting. 12/29/19  Yes Ladell Pier, MD  oxymetazoline (VICKS SINEX) 0.05 % nasal spray Place 1 spray into both nostrils 2 (two) times daily as needed for congestion.    Yes [provider]  polyethylene glycol (MIRALAX / GLYCOLAX) 17 g packet Take 17 g by mouth daily as needed (constipation.).    Yes [provider]  prochlorperazine (COMPAZINE) 10 MG tablet Take 1 tablet (10 mg total) by mouth every 6 (six) hours as needed for nausea or vomiting. Patient taking differently: Take 10 mg by mouth every 6 (six) hours as needed for nausea or vomiting. Nausea/vomiting 12/31/19  Yes Ladell Pier, MD  senna-docusate (SENOKOT-S) 8.6-50 MG tablet Take 1 tablet by mouth 2 (two) times daily as needed for mild constipation.   Yes [provider]     Total critical care time: 47 minutes  Performed by: Robertson care time was exclusive of separately billable procedures and treating other patients.   Critical care was necessary to treat or prevent imminent or life-threatening deterioration.   Critical care was time spent personally by me on the following activities: development of treatment plan with patient and/or surrogate as well as nursing, discussions with consultants, evaluation of patient's response to treatment, examination of patient, obtaining history from patient or surrogate, ordering and performing treatments and interventions, ordering and review of laboratory studies, ordering and review of radiographic studies, pulse oximetry and re-evaluation of patient's condition.   Jacky Kindle MD Critical care physician [facility]  Pager: [phone] Mobile: [phone]

## 2020-05-22 LAB — POCT I-STAT 7, (LYTES, BLD GAS, ICA,H+H)
Acid-Base Excess: 3 mmol/L — ABNORMAL HIGH (ref 0.0–2.0)
Bicarbonate: 27.6 mmol/L (ref 20.0–28.0)
Calcium, Ion: 0.95 mmol/L — ABNORMAL LOW (ref 1.15–1.40)
HCT: 33 % — ABNORMAL LOW (ref 36.0–46.0)
Hemoglobin: 11.2 g/dL — ABNORMAL LOW (ref 12.0–15.0)
O2 Saturation: 96 %
Patient temperature: 33.7
Potassium: 4.1 mmol/L (ref 3.5–5.1)
Sodium: 138 mmol/L (ref 135–145)
TCO2: 29 mmol/L (ref 22–32)
pCO2 arterial: 37.3 mmHg (ref 32.0–48.0)
pH, Arterial: 7.464 — ABNORMAL HIGH (ref 7.350–7.450)
pO2, Arterial: 63 mmHg — ABNORMAL LOW (ref 83.0–108.0)

## 2020-05-22 LAB — URINE CULTURE: Culture: NO GROWTH

## 2020-05-22 LAB — HEPATIC FUNCTION PANEL
ALT: 1460 U/L — ABNORMAL HIGH (ref 0–44)
AST: 1844 U/L — ABNORMAL HIGH (ref 15–41)
Albumin: 3.4 g/dL — ABNORMAL LOW (ref 3.5–5.0)
Alkaline Phosphatase: 456 U/L — ABNORMAL HIGH (ref 38–126)
Bilirubin, Direct: 2.1 mg/dL — ABNORMAL HIGH (ref 0.0–0.2)
Indirect Bilirubin: 8.2 mg/dL — ABNORMAL HIGH (ref 0.3–0.9)
Total Bilirubin: 10.3 mg/dL — ABNORMAL HIGH (ref 0.3–1.2)
Total Protein: 4.8 g/dL — ABNORMAL LOW (ref 6.5–8.1)

## 2020-05-22 LAB — GLOBAL TEG PANEL
CFF Max Amplitude: <4 mm — ABNORMAL LOW (ref 15–32)
CK with Heparinase (R): 7.3 min (ref 4.3–8.3)
Citrated Kaolin (K): 4.3 min — ABNORMAL HIGH (ref 0.8–2.1)
Citrated Kaolin (MA): 42.7 mm — ABNORMAL LOW (ref 52–69)
Citrated Kaolin (R): 7.3 min (ref 4.6–9.1)
Citrated Kaolin Angle: 60.5 deg — ABNORMAL LOW (ref 63–78)
Citrated Rapid TEG (MA): <40 mm — ABNORMAL LOW (ref 52–70)

## 2020-05-22 LAB — RENAL FUNCTION PANEL
Albumin: 3.4 g/dL — ABNORMAL LOW (ref 3.5–5.0)
Anion gap: 14 (ref 5–15)
BUN: <5 mg/dL — ABNORMAL LOW (ref 8–23)
CO2: 21 mmol/L — ABNORMAL LOW (ref 22–32)
Calcium: 7.9 mg/dL — ABNORMAL LOW (ref 8.9–10.3)
Chloride: 101 mmol/L (ref 98–111)
Creatinine, Ser: 0.9 mg/dL (ref 0.44–1.00)
GFR calc Af Amer: >60 mL/min (ref >60)
GFR calc non Af Amer: >60 mL/min (ref >60)
Glucose, Bld: 105 mg/dL — ABNORMAL HIGH (ref 70–99)
Phosphorus: 3.2 mg/dL (ref 2.5–4.6)
Potassium: 4.1 mmol/L (ref 3.5–5.1)
Sodium: 136 mmol/L (ref 135–145)

## 2020-05-22 LAB — CBC
HCT: 36.7 % (ref 36.0–46.0)
Hemoglobin: 12.1 g/dL (ref 12.0–15.0)
MCH: 31.3 pg (ref 26.0–34.0)
MCHC: 33 g/dL (ref 30.0–36.0)
MCV: 95.1 fL (ref 80.0–100.0)
Platelets: 159 10*3/uL (ref 150–400)
RBC: 3.86 MIL/uL — ABNORMAL LOW (ref 3.87–5.11)
RDW: 18 % — ABNORMAL HIGH (ref 11.5–15.5)
WBC: 32.3 10*3/uL — ABNORMAL HIGH (ref 4.0–10.5)
nRBC: 0.4 % — ABNORMAL HIGH (ref 0.0–0.2)

## 2020-05-22 LAB — GLUCOSE, CAPILLARY
Glucose-Capillary: 57 mg/dL — ABNORMAL LOW (ref 70–99)
Glucose-Capillary: 145 mg/dL — ABNORMAL HIGH (ref 70–99)
Glucose-Capillary: 95 mg/dL (ref 70–99)
Glucose-Capillary: 74 mg/dL (ref 70–99)
Glucose-Capillary: 124 mg/dL — ABNORMAL HIGH (ref 70–99)
Glucose-Capillary: 55 mg/dL — ABNORMAL LOW (ref 70–99)

## 2020-05-22 LAB — PROTIME-INR
INR: >10 (ref 0.8–1.2)
Prothrombin Time: 87.4 seconds — ABNORMAL HIGH (ref 11.4–15.2)

## 2020-05-22 LAB — MAGNESIUM: Magnesium: 2.3 mg/dL (ref 1.7–2.4)

## 2020-05-22 LAB — AMMONIA: Ammonia: 50 umol/L — ABNORMAL HIGH (ref 9–35)

## 2020-05-22 LAB — LACTIC ACID, PLASMA: Lactic Acid, Venous: 7.2 mmol/L (ref 0.5–1.9)

## 2020-05-22 MED ORDER — VITAMIN K1 10 MG/ML IJ SOLN
10.0000 mg | Freq: Once | INTRAVENOUS | Status: AC
  Administered 2020-05-22: 10 mg via INTRAVENOUS
  Filled 2020-05-22: qty 1

## 2020-05-22 MED ORDER — DEXTROSE 50 % IV SOLN
INTRAVENOUS | Status: AC
  Administered 2020-05-22: 50 mL
  Filled 2020-05-22: qty 50

## 2020-05-22 MED ORDER — DEXTROSE 50 % IV SOLN: 25.0000 mL | Freq: Once | INTRAVENOUS | Status: DC

## 2020-05-22 MED ORDER — DEXTROSE IN LACTATED RINGERS 5 % IV SOLN: INTRAVENOUS | Status: DC

## 2020-05-22 MED ORDER — NOREPINEPHRINE 16 MG/250ML-% IV SOLN
0.0000 ug/min | INTRAVENOUS | Status: DC
  Administered 2020-05-22: 36 ug/min via INTRAVENOUS
  Administered 2020-05-22: 40 ug/min via INTRAVENOUS
  Filled 2020-05-22 (×2): qty 250

## 2020-05-22 MED ORDER — EPINEPHRINE HCL 5 MG/250ML IV SOLN IN NS
0.5000 ug/min | INTRAVENOUS | Status: DC
  Administered 2020-05-22: 09:00:00 0.5 ug/min via INTRAVENOUS
  Filled 2020-05-22 (×2): qty 250

## 2020-05-23 LAB — BPAM RBC
Blood Product Expiration Date: 202109212359
Blood Product Expiration Date: 202109212359
Blood Product Expiration Date: 202109212359
Blood Product Expiration Date: 202109242359
Blood Product Expiration Date: 202109262359
Blood Product Expiration Date: 202109262359
ISSUE DATE / TIME: 202109020849
ISSUE DATE / TIME: 202109020849
ISSUE DATE / TIME: 202109021337
ISSUE DATE / TIME: 202109021337
ISSUE DATE / TIME: 202109030516
Unit Type and Rh: 9500
Unit Type and Rh: 9500
Unit Type and Rh: 9500
Unit Type and Rh: 9500
Unit Type and Rh: 9500
Unit Type and Rh: 9500

## 2020-05-23 LAB — TYPE AND SCREEN
ABO/RH(D): O NEG
Antibody Screen: NEGATIVE
Unit division: 0
Unit division: 0
Unit division: 0
Unit division: 0
Unit division: 0
Unit division: 0

## 2020-05-24 NOTE — Anesthesia Postprocedure Evaluation (Signed)
Anesthesia Post Note  [name]  Procedure(s) Performed: LAPAROSCOPY DIAGNOSTIC (N/A Abdomen) WHIPPLE PROCEDURE (N/A Abdomen)     Patient location during evaluation: PACU Anesthesia Type: Epidural Level of consciousness: responds to stimulation and patient cooperative Pain management: pain level controlled Vital Signs Assessment: post-procedure vital signs reviewed and stable Respiratory status: spontaneous breathing, nonlabored ventilation, respiratory function stable and patient connected to nasal cannula oxygen Cardiovascular status: blood pressure returned to baseline and stable Postop Assessment: no apparent nausea or vomiting Anesthetic complications: no Comments: Patient remains acidotic and having minimal urine output. Metabolic acidosis by ABG. Discussed with Dr Barry Dienes who agreed with extubating patient and requested amp of bicarb upon transitioning to PACU   No complications documented.             [name]

## 2020-05-25 LAB — CULTURE, BLOOD (ROUTINE X 2)
Culture: NO GROWTH
Culture: NO GROWTH
Special Requests: ADEQUATE

## 2020-05-25 LAB — SURGICAL PATHOLOGY

## 2020-06-13 ENCOUNTER — Ambulatory Visit: Payer: Medicare Other | Admitting: Nurse Practitioner

## 2020-06-17 NOTE — Progress Notes (Signed)
NAME:  Mary [name], MRN:  [id number], DOB:  08-19-1951, LOS: 3 ADMISSION DATE:  05/25/2020, CONSULTATION DATE:  9/3 REFERRING MD:  Dr. Barry Dienes, CHIEF COMPLAINT:  Shock, Acidosis s/p Whipple  Brief History   16 F with pancreatic cancer s/p chemo, admitted for Whipple post op developed shock, acidosis, and encephalopathy. PCCM consulted.   Past Medical History   has a past medical history of Abdominal distention (12/07/2019), Anorexia (12/07/2019), Anxiety, Asthma, Atrial septal defect, sinus venosus (12/07/2019), Common bile duct dilation (3/32/9518), Complication of anesthesia, Dilated pancreatic duct (12/07/2019), Dysrhythmia, Elevated lipase (12/07/2019), Essential hypertension (12/07/2019), Family history of breast cancer, History of kidney stones, Hypothyroidism (12/07/2019), Loss of weight (12/07/2019), Nausea (12/07/2019), pancreatic ca (11/2019), Pancreatic mass (12/07/2019), Pneumonia, and Pure hypercholesterolemia (12/07/2019).   Significant Hospital Events   9/2 admit, whipple 9/3 decreased LOC, acidosis, tx to ICU. Intuabted and started on CRRT.   Consults:  Nephrology Surgery Neurology (code stroke 9/3, signed off)  Procedures:  Whipple 9/2 ETT 9/3 > Port (pre hosp) > HD cath 9/3 >  Significant Diagnostic Tests:  CT head 9/3 > negative  Micro Data:  Blood 9/3 >   Urine 9/3 > Sputum 9/3 >  Antimicrobials:  Cefepime 9/3 > Flagyl 9/3 >  Interim history/subjective:  Patient's condition got worse, with worsening shock requiring high-dose of vasopressors. 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 She is hypothermic  Objective   Blood pressure (!) 47/29, pulse (!) 106, temperature (!) 91.9 F (33.3 C), resp. rate 20, height 5\' 1"  (1.549 m), weight 69.2 kg, SpO2 98 %.    Vent Mode: PRVC FiO2 (%):  [30 %] 30 % Set Rate:  [20 bmp] 20 bmp Vt Set:  [380 mL] 380 mL PEEP:  [5 cmH20] 5 cmH20 Plateau Pressure:  [14 cmH20-16 cmH20] 14 cmH20     Intake/Output Summary (Last 24 hours) at 2020-06-06 1502 Last data filed at June 06, 2020 1400 Gross per 24 hour  Intake 2472.16 ml  Output 1399 ml  Net 1073.16 ml   Filed Weights   06/15/2020 0618 05/21/20 0500 June 06, 2020 0500  Weight: 57.7 kg 65.4 kg 69.2 kg    Examination: General: Frail elderly female, lying on the bed, orally intubated HENT: [place]/AT, PERRL, no JVD.  ETT and OGT in place Lungs: Reduced air entry at the bases, no wheezes or rhonchi Cardiovascular: Irregularly irregular Abdomen: Soft, tender around surgical site, mild distention. Surgical wounds intact. Drains x 2 with serosanguinous  Extremities: No acute deformity Neuro: Eyes closed, not following commands Skin: Pale  Resolved Hospital Problem list     Assessment & Plan:   Pancreatic adenocarcinoma: s/p chemo, whipple 9/3  Acute hypoxic respiratory failure Shock, multifactorial: hypovolemic and septic AKI Complex acid-base disorder with high anion gap metabolic acidosis, metabolic alkalosis and respiratory alkalosis, multifactorial due to lactic 2/2 shock vs bowel ischemia, renal failure, DKA Shock liver with coagulopathy and lactic acidosis  Patient condition got worse, started getting hypotensive.  She was started on Levophed, even with high dose of Levophed patient blood pressure remained low with map in 50s, she was started on epi drip without improvement in blood pressure.  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 Per IR she was not candidate for catheter directed thrombolysis considering she underwent major surgery just few days ago. Condition was explained to patient's husband, he decided to keep patient comfortable and do not escalate care with palliative extubation.  Patient was  made comfort care only   Best practice:  Diet: NPO Pain/Anxiety/Delirium protocol (if indicated): Fentanyl as needed with RASS 0 to -1 VAP protocol (if indicated):  Yes DVT prophylaxis: SCD GI prophylaxis: PPO Glucose control: SSI Mobility: BR Code Status: DNR Family Communication: Husband updated at length. Wants give full aggressive care short of CPR/ACLS. DNR/comfort care only Disposition: ICU  Labs   CBC: Recent Labs  Lab 05/16/20 1315 06/11/2020 0859 05/20/20 0513 05/20/20 0602 05/20/20 1014 05/20/20 1015 05/20/20 1152 05/20/20 1709 05/21/20 0457 05/21/20 0632 June 10, 2020 0158 Jun 10, 2020 0500  WBC 5.4  --  25.9*  --   --  25.3*  --   --   --  17.8*  --  32.3*  NEUTROABS 3.1  --   --   --   --   --   --   --   --   --   --   --   HGB 11.0*   < > 11.9*   < >  --  12.6   < > 10.9* 10.2* 10.7* 11.2* 12.1  HCT 34.4*   < > 39.1   < >  --  40.1   < > 32.0* 30.0* 31.5* 33.0* 36.7  MCV 102.7*  --  99.2  --   --  99.3  --   --   --  92.9  --  95.1  PLT 356  --  325  --  330 315  --   --   --  122*  --  159   < > = values in this interval not displayed.    Basic Metabolic Panel: Recent Labs  Lab 05/20/20 0513 05/20/20 0602 05/20/20 1600 05/20/20 1709 05/20/20 2232 05/21/20 0457 05/21/20 0500 05/21/20 0632 05/21/20 1510 06/10/20 0158 2020/06/10 0500  NA 141   < > 140  138   < > 139   < > 138 136 137 138 136  K 4.9   < > 5.0  5.0   < > 4.5   < > 4.3 3.8 4.9 4.1 4.1  CL 106   < > 100  99   < > 97*  --  94* 93* 93*  --  101  CO2 <7*   < > 15*  15*   < > 24  --  25 27 30   --  21*  GLUCOSE 336*   < > 180*  178*   < > 150*  --  177* 140* 71  --  105*  BUN 8   < > 7*  8   < > 5*  --  <5* <5* <5*  --  <5*  CREATININE 2.30*   < > 1.91*  1.97*   < > 1.48*  --  1.33* 1.28* 1.12*  --  0.90  CALCIUM 7.8*   < > 8.2*  8.2*   < > 7.7*  --  7.8* 7.6* 7.9*  --  7.9*  MG 1.8  --   --   --   --   --   --  2.0  --   --  2.3  PHOS 8.3*  --  4.1  --   --   --  2.7  --  3.0  --  3.2   < > = values in this interval not displayed.   GFR: Estimated Creatinine Clearance: 53.3 mL/min (by C-G formula based on SCr of 0.9 mg/dL). Recent Labs  Lab  05/20/20 0513 05/20/20 1013 05/20/20 1015 05/20/20 1311 05/21/20  5631 05/21/20 0855 06-18-2020 0500 2020-06-18 0507  WBC 25.9*  --  25.3*  --  17.8*  --  32.3*  --   LATICACIDVEN  --  >11.0*  --  >11.0*  --  8.5*  --  7.2*    Liver Function Tests: Recent Labs  Lab 05/20/20 1600 05/20/20 1600 05/20/20 2232 05/21/20 0500 05/21/20 0632 05/21/20 1510 18-Jun-2020 0500  AST 1,969*  --  2,122* 1,896* 1,892*  --  1,844*  ALT 940*  --  973* 1,004* 1,023*  --  1,460*  ALKPHOS 246*  --  255* 263* 253*  --  456*  BILITOT 8.2*  --  8.0* 8.5* 8.5*  --  10.3*  PROT 5.2*  --  5.0* 5.1* 5.3*  --  4.8*  ALBUMIN 3.6  3.5   < > 3.4* 4.0 3.9 3.8 3.4*  3.4*   < > = values in this interval not displayed.   Recent Labs  Lab 05/16/20 1315  LIPASE 31   Recent Labs  Lab 05/20/20 0907 06/18/20 0922  AMMONIA 88* 50*    ABG    Component Value Date/Time   PHART 7.464 (H) 06-18-2020 0158   PCO2ART 37.3 06-18-2020 0158   PO2ART 63 (L) 18-Jun-2020 0158   HCO3 27.6 June 18, 2020 0158   TCO2 29 2020/06/18 0158   ACIDBASEDEF 4.0 (H) 05/20/2020 1709   O2SAT 96.0 06/18/20 0158     Coagulation Profile: Recent Labs  Lab 05/16/20 1315 05/20/20 0513 05/20/20 1014 06-18-20 0922  INR 1.0 3.5* 3.6* >10.0*    Cardiac Enzymes: No results for input(s): CKTOTAL, CKMB, CKMBINDEX, TROPONINI in the last 168 hours.  HbA1C: Hgb A1c MFr Bld  Date/Time Value Ref Range Status  05/16/2020 01:15 PM 5.8 (H) 4.8 - 5.6 % Final    Comment:    (NOTE) Pre diabetes:          5.7%-6.4%  Diabetes:              >6.4%  Glycemic control for   <7.0% adults with diabetes     CBG: Recent Labs  Lab 06-18-2020 0347 Jun 18, 2020 0521 Jun 18, 2020 0746 June 18, 2020 1148 June 18, 2020 1225  GLUCAP 145* 95 74 55* 124*    Review of Systems:   unable  Past Medical History  She,  has a past medical history of Abdominal distention (12/07/2019), Anorexia (12/07/2019), Anxiety, Asthma, Atrial septal defect, sinus venosus  (12/07/2019), Common bile duct dilation (4/97/0263), Complication of anesthesia, Dilated pancreatic duct (12/07/2019), Dysrhythmia, Elevated lipase (12/07/2019), Essential hypertension (12/07/2019), Family history of breast cancer, History of kidney stones, Hypothyroidism (12/07/2019), Loss of weight (12/07/2019), Nausea (12/07/2019), pancreatic ca (11/2019), Pancreatic mass (12/07/2019), Pneumonia, and Pure hypercholesterolemia (12/07/2019).   Surgical History    Past Surgical History:  Procedure Laterality Date  . ASD REPAIR, SINUS VENOSUS     at age 88   . BILIARY STENT PLACEMENT  12/11/2019   Procedure: BILIARY STENT PLACEMENT;  Surgeon: [name], MD;  Location: [facility] ENDOSCOPY;  Service: Endoscopy;;  . COLONOSCOPY    . ENDOSCOPIC RETROGRADE CHOLANGIOPANCREATOGRAPHY (ERCP) WITH PROPOFOL N/A 12/11/2019   Procedure: ENDOSCOPIC RETROGRADE CHOLANGIOPANCREATOGRAPHY (ERCP) WITH PROPOFOL;  Surgeon: [name], MD;  Location: [facility] ENDOSCOPY;  Service: Endoscopy;  Laterality: N/A;  . ESOPHAGOGASTRODUODENOSCOPY (EGD) WITH PROPOFOL N/A 12/11/2019   Procedure: ESOPHAGOGASTRODUODENOSCOPY (EGD) WITH PROPOFOL;  Surgeon: [name], MD;  Location[facility] ENDOSCOPY;  Service: Endoscopy;  Laterality: N/A;  . FINE NEEDLE ASPIRATION  12/11/2019   Procedure: FINE NEEDLE ASPIRATION (FNA) LINEAR;  Surgeon: [name], MD;  Location: [facility] ENDOSCOPY;  Service: Endoscopy;;  . IR IMAGING GUIDED PORT INSERTION  01/04/2020  . LAPAROSCOPY N/A 05/20/2020   Procedure: LAPAROSCOPY DIAGNOSTIC;  Surgeon: Stark Klein, MD;  Location: [place];  Service: General;  Laterality: N/A;  . TONSILLECTOMY    . TRANSTHORACIC ECHOCARDIOGRAM     08/18/19 ([facility]): Normal LV/RV size and LVF/RVF, EF 55%. Borderline LV wall thickness. Normal PASP 26 mmHg. Normal cardiac valves. No residual shunt. No evidence of RV overload or enlargement.   . TUBAL LIGATION    . UPPER ESOPHAGEAL ENDOSCOPIC ULTRASOUND (EUS) N/A 12/11/2019   Procedure: UPPER  ESOPHAGEAL ENDOSCOPIC ULTRASOUND (EUS);  Surgeon: [name], MD;  Location: [facility] ENDOSCOPY;  Service: Endoscopy;  Laterality: N/A;  . WHIPPLE PROCEDURE N/A 05/30/2020   Procedure: WHIPPLE PROCEDURE;  Surgeon: Stark Klein, MD;  Location: [place];  Service: General;  Laterality: N/A;     Social History   reports that she has never smoked. She has never used smokeless tobacco. She reports that she does not drink alcohol and does not use drugs.   Family History   Her family history includes Alzheimer's disease in her brother; Breast cancer in her paternal aunt; COPD in her mother; Dementia in her father, paternal grandfather, and paternal uncle; Diabetes in her father, paternal aunt, and paternal uncle; Hypertension in her father; Other in her maternal grandfather.   Allergies Allergies  Allergen Reactions  . Ultram [Tramadol] Nausea And Vomiting  . Penicillins Rash    Did it involve swelling of the face/tongue/throat, SOB, or low BP? No Did it involve sudden or severe rash/hives, skin peeling, or any reaction on the inside of your mouth or nose? No Did you need to seek medical attention at a hospital or doctor's office? No When did it last happen?20 + years If all above answers are "NO", may proceed with cephalosporin use.   . Sulfa Antibiotics Rash     Home Medications  Prior to Admission medications   Medication Sig Start Date End Date Taking? Authorizing Provider  acetaminophen (TYLENOL) 500 MG tablet Take 500-1,000 mg by mouth every 6 (six) hours as needed for moderate pain or headache.    Yes [provider]  albuterol (VENTOLIN HFA) 108 (90 Base) MCG/ACT inhaler Inhale 2 puffs into the lungs every 4 (four) hours as needed for wheezing or shortness of breath.  08/18/19  Yes [provider]  aspirin EC 81 MG tablet Take 81 mg by mouth at bedtime.    Yes [provider]  dicyclomine (BENTYL) 10 MG capsule TAKE 1 CAPSULE BY MOUTH TWICE A DAY AS NEEDED  FOR SPASMS Patient taking differently: Take 10 mg by mouth 2 (two) times daily as needed (abdominal spasms).  02/09/20  Yes Alla Feeling, NP  Homeopathic Products (ARNICA EX) Apply 1 application topically 2 (two) times daily as needed (foot burning). [employer]ish Foot Cream (blend of botanical oils Tea Tree/Peppermint/Arnica)    Yes [provider]  HYDROcodone-acetaminophen (NORCO/VICODIN) 5-325 MG tablet Take 1 tablet by mouth every 6 (six) hours as needed for moderate pain. 12/15/19  Yes Alla Feeling, NP  lidocaine-prilocaine (EMLA) cream Apply 1 application topically as needed. Patient taking differently: Apply 1 application topically as needed (prior to port being accessed.).  12/31/19  Yes Ladell Pier, MD  LORazepam (ATIVAN) 0.5 MG tablet Take 1 tablet (0.5 mg total) by mouth every 12 (twelve) hours as needed for anxiety. Do not drive while  taking Patient taking differently: Take 0.5 mg by mouth every 12 (twelve) hours as needed for anxiety or sleep. Do not drive while taking 10/25/00  Yes Ladell Pier, MD  losartan (COZAAR) 50 MG tablet Take 50 mg by mouth at bedtime.  11/29/19  Yes [provider]  mirtazapine (REMERON) 7.5 MG tablet TAKE 1 TABLET BY MOUTH EVERYDAY AT BEDTIME Patient taking differently: Take 7.5 mg by mouth at bedtime as needed (sleep.).  04/06/20  Yes Ladell Pier, MD  ondansetron (ZOFRAN-ODT) 8 MG disintegrating tablet Take 1 tablet (8 mg total) by mouth every 8 (eight) hours as needed for nausea or vomiting. 12/29/19  Yes Ladell Pier, MD  oxymetazoline (VICKS SINEX) 0.05 % nasal spray Place 1 spray into both nostrils 2 (two) times daily as needed for congestion.    Yes [provider]  polyethylene glycol (MIRALAX / GLYCOLAX) 17 g packet Take 17 g by mouth daily as needed (constipation.).    Yes [provider]  prochlorperazine (COMPAZINE) 10 MG tablet Take 1 tablet (10 mg total) by mouth every 6 (six) hours as needed for nausea  or vomiting. Patient taking differently: Take 10 mg by mouth every 6 (six) hours as needed for nausea or vomiting. Nausea/vomiting 12/31/19  Yes Ladell Pier, MD  senna-docusate (SENOKOT-S) 8.6-50 MG tablet Take 1 tablet by mouth 2 (two) times daily as needed for mild constipation.   Yes [provider]     Total critical care time: 35 minutes  Performed by: [place] care time was exclusive of separately billable procedures and treating other patients.   Critical care was necessary to treat or prevent imminent or life-threatening deterioration.   Critical care was time spent personally by me on the following activities: development of treatment plan with patient and/or surrogate as well as nursing, discussions with consultants, evaluation of patient's response to treatment, examination of patient, obtaining history from patient or surrogate, ordering and performing treatments and interventions, ordering and review of laboratory studies, ordering and review of radiographic studies, pulse oximetry and re-evaluation of patient's condition.   Jacky Kindle MD Critical care physician [facility]  Pager: [phone] Mobile: [phone]

## 2020-06-17 NOTE — Progress Notes (Addendum)
Pt remains critically ill MAP 50s, sys 60-70 on near max of 2 pressors INR >10 Still cold despite bear hugger, warming blanket, warming room  IR thrombolysis of PV not option due to fresh surgery on PV per Dr Barry Dienes after discussing with IR  D/w Dr Barry Dienes  Unfortunately not much else to offer pt.  I had previously updated husband to encourage him to inform addl family to visit pt if desired  Cont current care.  I don't think addition of a 3rd vasopressor would add much benefit to pt's care Would treat any pain Unfortunately prognosis extremely poor  Updated husband.  He wants to switch to comfort care & stop aggressive treatment - just want her to pass comfortably.  He indicated that he and his wife had a discussion preoperatively that if there was severe postop complication she would not want extended heroic efforts.   Will switch to comfort care.  Family at bedside. Chaplain services offered  Leighton Ruff. Redmond Pulling, MD, FACS General, Bariatric, & Minimally Invasive Surgery [facility], [place]

## 2020-06-17 NOTE — Anesthesia Post-op Follow-up Note (Signed)
  Anesthesia Pain Follow-up Note  [name]  Day #: 3  Date of Follow-up: 2020/06/10 Time: 9:29 AM  Last Vitals:  Vitals:   2020-06-10 0853 06/10/20 0900  BP: (!) 90/50   Pulse: (!) 107   Resp: 20 20  Temp: (!) 33.3 C   SpO2: 100%     Level of Consciousness: obtunded  Pain: unable to assess   Side Effects:None  Catheter Site Exam: unable to exam as pt is critically ill and the team doesn't want to risk turning and destabilizing the hemodynamics.  Anti-Coag Meds (From admission, onward)   Start     Dose/Rate Route Frequency Ordered Stop   05/20/20 0919  heparin injection 1,000-6,000 Units        1,000-6,000 Units CRRT As needed 05/20/20 0920         Plan: Continue current therapy of postop epidural at surgeon's request. Epidural infusion continues to be off.  Pt is critically ill on levo and epi gtts.  [place]

## 2020-06-17 NOTE — Consult Note (Signed)
Responded to page re: pt's being transitioned to comfort care. Met several family members bedside, including pt's husband, children, in-laws, grandchildren. Offered spiritual/emotional comfort and prayer--which they much appreciated. Pt's husband sad pt was [facility], and he was [employer].They expect pt to pass not long after transition to comfort care, and thanked me for coming.   Rev. Eloise Levels Chaplain

## 2020-06-17 NOTE — Progress Notes (Signed)
Patient ID: Mary [name], female   DOB: June 28, 1951, 69 y.o.   MRN: [id number] Follow up - Trauma Critical Care  Patient Details:    Mary [name] is an 69 y.o. female.  Lines/tubes : Airway 7.5 mm (Active)  Secured at (cm) 23 cm 06-10-2020 0853  Measured From Lips 2020/06/10 0853  Secured Location Left 2020-06-10 0853  Secured By [employer] June 10, 2020 0853  Tube Holder Repositioned Yes June 10, 2020 0853  Cuff Pressure (cm H2O) 30 cm H2O 06-10-20 0853  Site Condition Dry 06-10-2020 0853     Implanted Port 01/04/20 Right Chest (Active)  Site Assessment Clean;Dry;Intact 05/21/20 2000  Port Intervention Accessed 05/21/20 2000  Needle Size PH 20 gauge 05/20/20 1023  Line Care Connections checked and tightened 05/20/20 1945  Line Status Infusing 05/21/20 2000  Dressing Type Transparent;Occlusive 05/21/20 2000  Dressing Status Antimicrobial disc in place;Intact;Dry;Clean 05/21/20 2000  Dressing Intervention New dressing 05/20/20 1023  Needle Change Due 05/27/20 05/21/20 2000     Epidural Catheter 05/30/2020 (Active)  Site Assessment Clean;Dry;Intact 05/21/20 2000  Line Status Other (Comment) 05/20/20 1945  Dressing Type Transparent;Occlusive 05/21/20 2000  Dressing Status Clean;Dry;Intact 05/21/20 2000     Closed System Drain 1 Right;Medial Abdomen Other (Comment) 19 Fr. (Active)  Site Description Unremarkable 05/21/20 2000  Dressing Status Clean;Intact;Dry 05/21/20 2000  Drainage Appearance Bloody 05/20/20 2000  Status To gravity (Uncharged) 05/20/20 2000  Output (mL) 25 mL 06-10-20 0800     Closed System Drain 2 Right;Lateral Abdomen Other (Comment) 19 Fr. (Active)  Site Description Unremarkable 05/21/20 2000  Dressing Status Clean;Dry;Intact 05/21/20 2000  Drainage Appearance Bloody 05/20/20 2000  Status To gravity (Uncharged) 05/20/20 2000  Output (mL) 25 mL Jun 10, 2020 0800     NG/OG Tube Nasogastric 16 Fr. Left nare Confirmed by Surgical Manipulation Measured external  length of tube 57 cm (Active)  Cm Marking at Nare/Corner of Mouth (if applicable) 56 cm 96/22/29 2000  Site Assessment Clean;Dry;Intact 05/21/20 2000  Ongoing Placement Verification No change in cm markings or external length of tube from initial placement;No change in respiratory status;No acute changes, not attributed to clinical condition 05/21/20 1200  Status Suction-low intermittent 05/21/20 2000  Drainage Appearance Bloody;Brown 05/20/20 2000  Intake (mL) 90 mL 05/21/20 2000  Output (mL) 0 mL 06-10-20 0700     Urethral Catheter P. Ouida Sills, RN Latex;Straight-tip 14 Fr. (Active)  Indication for Insertion or Continuance of Catheter Therapy based on hourly urine output monitoring and documentation for critical condition (NOT STRICT I&O) 06-10-20 0734  Site Assessment Clean;Intact Jun 10, 2020 0733  Catheter Maintenance Bag below level of bladder;Catheter secured;Drainage bag/tubing not touching floor;Insertion date on drainage bag;No dependent loops;Seal intact 2020/06/10 0733  Collection Container Standard drainage bag 2020-06-10 0733  Securement Method Securing device (Describe) 06-10-2020 0733  Urinary Catheter Interventions (if applicable) Unclamped 79/89/21 0733  Output (mL) 0 mL Jun 10, 2020 0800     GI Stent 10 Fr. (Active)    Microbiology/Sepsis markers: Results for orders placed or performed during the hospital encounter of 06/06/2020  MRSA PCR Screening     Status: None   Collection Time: 05/28/2020  7:36 PM   Specimen: Nasopharyngeal  Result Value Ref Range Status   MRSA by PCR NEGATIVE NEGATIVE Final    Comment:        The GeneXpert MRSA Assay (FDA approved for NASAL specimens only), is one component of a comprehensive MRSA colonization surveillance program. It is not intended to diagnose MRSA infection nor to guide or monitor treatment for  MRSA infections. Performed at [facility], [place] [address], [place], [place] [address]   Culture, blood (Routine X 2) w Reflex to ID  Panel     Status: None (Preliminary result)   Collection Time: 05/20/20  5:49 PM   Specimen: BLOOD LEFT HAND  Result Value Ref Range Status   Specimen Description BLOOD LEFT HAND  Final   Special Requests   Final    BOTTLES DRAWN AEROBIC AND ANAEROBIC Blood Culture results may not be optimal due to an inadequate volume of blood received in culture bottles   Culture   Final    NO GROWTH 2 DAYS Performed at [facility], [place] [address] [place], [place] [address]    Report Status PENDING  Incomplete  Culture, blood (Routine X 2) w Reflex to ID Panel     Status: None (Preliminary result)   Collection Time: 05/20/20  5:56 PM   Specimen: BLOOD LEFT ARM  Result Value Ref Range Status   Specimen Description BLOOD LEFT ARM  Final   Special Requests   Final    BOTTLES DRAWN AEROBIC AND ANAEROBIC Blood Culture adequate volume   Culture   Final    NO GROWTH 2 DAYS Performed at [facility], [place] [address] [place], [place] [address]    Report Status PENDING  Incomplete  Culture, Urine     Status: None   Collection Time: 05/20/20 11:05 PM   Specimen: Urine, Random  Result Value Ref Range Status   Specimen Description URINE, RANDOM  Final   Special Requests NONE  Final   Culture   Final    NO GROWTH Performed at [facility], [place] [address], [place], [place] [address]    Report Status 06/19/20 FINAL  Final    Anti-infectives:  Anti-infectives (From admission, onward)   Start     Dose/Rate Route Frequency Ordered Stop   05/20/20 1300  metroNIDAZOLE (FLAGYL) IVPB 500 mg        500 mg 100 mL/hr over 60 Minutes Intravenous Every 8 hours 05/20/20 1145     05/20/20 1100  metroNIDAZOLE (FLAGYL) IVPB 500 mg  Status:  Discontinued        500 mg 100 mL/hr over 60 Minutes Intravenous Every 8 hours 05/20/20 1028 05/20/20 1145   05/20/20 1100  ceFEPIme (MAXIPIME) 2 g in sodium chloride 0.9 % 100 mL IVPB        2 g 200 mL/hr over 30 Minutes Intravenous Every 12 hours 05/20/20  1033     05/31/2020 0630  ciprofloxacin (CIPRO) IVPB 400 mg        400 mg 200 mL/hr over 60 Minutes Intravenous On call to O.R. 06/12/2020 0624 05/25/2020 0810      Best Practice/Protocols:  VTE Prophylaxis: Mechanical  Consults: Treatment Team:  Edrick Oh, MD  CCM  Studies:    Events:  Subjective:    Overnight Issues:  Worsening shock, now on 2 vasopressors; cold, liver duplex showed occluded PV and splenic vein Objective:  Vital signs for last 24 hours: Temp:  [91.8 F (33.2 C)-95 F (35 C)] 91.9 F (33.3 C) (09/05 0853) Pulse Rate:  [32-108] 107 (09/05 0853) Resp:  [20-22] 20 (09/05 0900) BP: (57-97)/(35-74) 90/50 (09/05 0853) SpO2:  [97 %-100 %] 100 % (09/05 0853) Arterial Line BP: (87-137)/(39-65) 92/52 (09/05 0900) FiO2 (%):  [30 %-40 %] 30 % (09/05 0853) Weight:  [69.2 kg] 69.2 kg (09/05 0500)  Hemodynamic parameters for last  24 hours:    Intake/Output from previous day: 09/04 0701 - 09/05 0700 In: 1544.2 [I.V.:934.7; NG/GT:90; IV Piggyback:519.5] Out: 1435 [Urine:23; Emesis/NG output:130; Drains:885]  Intake/Output this shift: Total I/O In: 221.6 [I.V.:221.6] Out: 198 [Drains:50; Other:148]  Vent settings for last 24 hours: Vent Mode: PRVC FiO2 (%):  [30 %-40 %] 30 % Set Rate:  [20 bmp] 20 bmp Vt Set:  [380 mL] 380 mL PEEP:  [5 cmH20] 5 cmH20 Plateau Pressure:  [16 cmH20] 16 cmH20  Physical Exam:  General: ill appearing; jaundice Neuro: opens eyes to voice HEENT/Neck: no JVD and ETT WNL  Resp: clear, on vent CVS: mild tachy, regular rate and rhythm, S1, S2 normal, no murmur, click, rub or gallop and off pressors GI: soft, not really distended or tender. OR dressing clean and intact. Drains with dark but thin serosanguinous outpuit Skin: no rash Extremities: no edema, no erythema, pulses WNL  Results for orders placed or performed during the hospital encounter of 06/14/2020 (from the past 24 hour(s))  Glucose, capillary     Status: None    Collection Time: 05/21/20 11:24 AM  Result Value Ref Range   Glucose-Capillary 86 70 - 99 mg/dL  Renal function panel (daily at 1600)     Status: Abnormal   Collection Time: 05/21/20  3:10 PM  Result Value Ref Range   Sodium 137 135 - 145 mmol/L   Potassium 4.9 3.5 - 5.1 mmol/L   Chloride 93 (L) 98 - 111 mmol/L   CO2 30 22 - 32 mmol/L   Glucose, Bld 71 70 - 99 mg/dL   BUN <5 (L) 8 - 23 mg/dL   Creatinine, Ser 1.12 (H) 0.44 - 1.00 mg/dL   Calcium 7.9 (L) 8.9 - 10.3 mg/dL   Phosphorus 3.0 2.5 - 4.6 mg/dL   Albumin 3.8 3.5 - 5.0 g/dL   GFR calc non Af Amer 50 (L) >60 mL/min   GFR calc Af Amer 58 (L) >60 mL/min   Anion gap 14 5 - 15  Blood gas, arterial     Status: Abnormal   Collection Time: 05/21/20  3:10 PM  Result Value Ref Range   FIO2 40.00    pH, Arterial 7.496 (H) 7.35 - 7.45   pCO2 arterial 41.1 32 - 48 mmHg   pO2, Arterial 77.1 (L) 83 - 108 mmHg   Bicarbonate 31.5 (H) 20.0 - 28.0 mmol/L   Acid-Base Excess 7.8 (H) 0.0 - 2.0 mmol/L   O2 Saturation 96.3 %   Patient temperature 37.0    Collection site A LINE    Drawn by  [facility] MANIVONG RN    Sample type ARTERIAL DRAW   Glucose, capillary     Status: Abnormal   Collection Time: 05/21/20  3:48 PM  Result Value Ref Range   Glucose-Capillary 65 (L) 70 - 99 mg/dL  Glucose, capillary     Status: Abnormal   Collection Time: 05/21/20  4:14 PM  Result Value Ref Range   Glucose-Capillary 106 (H) 70 - 99 mg/dL  Glucose, capillary     Status: Abnormal   Collection Time: 05/21/20  7:48 PM  Result Value Ref Range   Glucose-Capillary 100 (H) 70 - 99 mg/dL  Glucose, capillary     Status: None   Collection Time: 05/21/20 11:26 PM  Result Value Ref Range   Glucose-Capillary 71 70 - 99 mg/dL  I-STAT 7, (LYTES, BLD GAS, ICA, H+H)     Status: Abnormal   Collection Time: 06-18-20  1:58 AM  Result Value Ref Range   pH, Arterial 7.464 (H) 7.35 - 7.45   pCO2 arterial 37.3 32 - 48 mmHg   pO2, Arterial 63 (L) 83 - 108 mmHg   Bicarbonate  27.6 20.0 - 28.0 mmol/L   TCO2 29 22 - 32 mmol/L   O2 Saturation 96.0 %   Acid-Base Excess 3.0 (H) 0.0 - 2.0 mmol/L   Sodium 138 135 - 145 mmol/L   Potassium 4.1 3.5 - 5.1 mmol/L   Calcium, Ion 0.95 (L) 1.15 - 1.40 mmol/L   HCT 33.0 (L) 36 - 46 %   Hemoglobin 11.2 (L) 12.0 - 15.0 g/dL   Patient temperature 33.7 C    Collection site art line    Drawn by RT    Sample type ARTERIAL   Glucose, capillary     Status: Abnormal   Collection Time: Jun 10, 2020  3:23 AM  Result Value Ref Range   Glucose-Capillary 57 (L) 70 - 99 mg/dL  Glucose, capillary     Status: Abnormal   Collection Time: 06/10/20  3:47 AM  Result Value Ref Range   Glucose-Capillary 145 (H) 70 - 99 mg/dL  CBC     Status: Abnormal   Collection Time: June 10, 2020  5:00 AM  Result Value Ref Range   WBC 32.3 (H) 4.0 - 10.5 K/uL   RBC 3.86 (L) 3.87 - 5.11 MIL/uL   Hemoglobin 12.1 12.0 - 15.0 g/dL   HCT 36.7 36 - 46 %   MCV 95.1 80.0 - 100.0 fL   MCH 31.3 26.0 - 34.0 pg   MCHC 33.0 30.0 - 36.0 g/dL   RDW 18.0 (H) 11.5 - 15.5 %   Platelets 159 150 - 400 K/uL   nRBC 0.4 (H) 0.0 - 0.2 %  Renal function panel (daily at 0500)     Status: Abnormal   Collection Time: 06-10-20  5:00 AM  Result Value Ref Range   Sodium 136 135 - 145 mmol/L   Potassium 4.1 3.5 - 5.1 mmol/L   Chloride 101 98 - 111 mmol/L   CO2 21 (L) 22 - 32 mmol/L   Glucose, Bld 105 (H) 70 - 99 mg/dL   BUN <5 (L) 8 - 23 mg/dL   Creatinine, Ser 0.90 0.44 - 1.00 mg/dL   Calcium 7.9 (L) 8.9 - 10.3 mg/dL   Phosphorus 3.2 2.5 - 4.6 mg/dL   Albumin 3.4 (L) 3.5 - 5.0 g/dL   GFR calc non Af Amer >60 >60 mL/min   GFR calc Af Amer >60 >60 mL/min   Anion gap 14 5 - 15  Magnesium     Status: None   Collection Time: 06-10-20  5:00 AM  Result Value Ref Range   Magnesium 2.3 1.7 - 2.4 mg/dL  Hepatic function panel     Status: Abnormal   Collection Time: 10-Jun-2020  5:00 AM  Result Value Ref Range   Total Protein 4.8 (L) 6.5 - 8.1 g/dL   Albumin 3.4 (L) 3.5 - 5.0 g/dL    AST 1,844 (H) 15 - 41 U/L   ALT 1,460 (H) 0 - 44 U/L   Alkaline Phosphatase 456 (H) 38 - 126 U/L   Total Bilirubin 10.3 (H) 0.3 - 1.2 mg/dL   Bilirubin, Direct 2.1 (H) 0.0 - 0.2 mg/dL   Indirect Bilirubin 8.2 (H) 0.3 - 0.9 mg/dL  Lactic acid, plasma     Status: Abnormal   Collection Time: 06/10/2020  5:07 AM  Result Value Ref Range   Lactic Acid, Venous  7.2 (HH) 0.5 - 1.9 mmol/L  Glucose, capillary     Status: None   Collection Time: Jun 09, 2020  5:21 AM  Result Value Ref Range   Glucose-Capillary 95 70 - 99 mg/dL  Glucose, capillary     Status: None   Collection Time: 09-Jun-2020  7:46 AM  Result Value Ref Range   Glucose-Capillary 74 70 - 99 mg/dL    Assessment & Plan: Present on Admission: . Pancreatic cancer ([place]) POD 3 s/p whiple with PV reconstruction dr Barry Dienes Critically ill Hypotension CRRT  pulm - cont vent per CCM CV - shock, on 2 pressors, cold, cont vasopressors as needed for MAP >4mmHg, max warm pt - bear hugger, adding warming blanket,  Heme - hgb stable Renal - CRRT per renal FEN - cont ivf, lytes ok, npo Liver/GI - PV and splenic vein occluded, shock liver, will check INR. If INR somewhat normal will start IV heparin without bolus; If INR very abnormal - not a candidate for iv heparin; check ammonia, cont lactulose and actigall ID - cont IV abx  Dispo: icu; pt critically ill; long conversation with pt's husband. Discussed how critically ill pt is and that supportive therapy is only option at this point; no role for returning to OR ( I don't think she has ischemic bowel); reconfirmed code status with him (no chest compressions, no meds to restart heart); he is appreciative of our efforts and understands how critical she is.   Discussed pt's clinical status and labs and imaging with Dr Barry Dienes.   LOS: 3 days   Additional comments:I reviewed the patient's new clinical lab test results.  I reviewed the patients new imaging test results.  I have discussed and reviewed with  family members patient's husband and I discussed the CCM attending and Dr Barry Dienes  Critical Care Total Time*: [address] [place]. Redmond Pulling, MD, FACS General, Bariatric, & Minimally Invasive Surgery [facility], [place]   09-Jun-2020  *Care during the described time interval was provided by me. I have reviewed this patient's available data, including medical history, events of note, physical examination and test results as part of my evaluation.

## 2020-06-17 NOTE — Death Summary Note (Signed)
DEATH SUMMARY   Patient Details  Name: Mary [name] MRN: [id number] DOB: 03/27/1951  Admission/Discharge Information   Admit Date:  June 10, 2020  Date of Death: Date of Death: 13-Jun-2020  Time of Death: Time of Death: Dec 29, 1513  Length of Stay: 3  Referring Physician: Esperanza Sheets, FNP   Reason(s) for Hospitalization  adenocarcinoma of the pancreatic head  Moderate protein calorie malnutrition Hypothyroidism ASD FH breast cancer HTN Hypercholesterolemia   Diagnoses  Preliminary cause of death: Acute liver failure Secondary Diagnoses (including complications and co-morbidities):  Active Problems:   Pancreatic cancer ([place])   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 year old female who came to the OR 2020/06/10 for diagnostic laparoscopy and whipple for adenocarcinoma of the pancreatic head, s/p neoadjuvant chemotherapy.  She had adherence to superior mesenteric vein and had a short period of significant blood loss that required repair of portal vein.  This was narrowed post repair, but was below the confluence of the splenic and portal veins, so had good flow above it and flow through it intraoperatively.  She had some acidosis post op that was felt to reflect underresuscitation, so she was given additional blood products and some bicarb. Her urine output in the OR had not been good from the very beginning of the case.     In the early morning post op, she was seen to have decreased mental status and had a concern for a blown pupil.  Her BP was also a bit low, so she received some narcan (had previously had fentanyl pca and had used it up until around midnight) and a fluid bolus.  A code stroke was called and she received stat head CT and had neurology consult.   She was following some commands at that time and was able to respond to some questions.  She was acidotic and it was felt by neurology that this was metabolic in origin.    Within a few more hours, she was minimally responsive with elevated glucose, elevated creatinine.  Bili was up to 5.6, and transaminases were 684 and 440.  ABG at that time showed pH of 7/05 and bicarb of 5.6.  She was tachyneic and was hyperventilating to try to offset her acidosis as exhibited by her pCO2 on ABG of 19.1.  Renal and CCM were called and she was reintubated and started on CRRT.  Ammonia was elevated at 88.  Lactate was elevated at >11.  Her acidosis was improved a bit on the CRRT.  She started requiring pressors.  A liver duplex was ordered.    She developed worsening shock on 2 vasopressors and duplex showed occluded splenic vein and pv/SMV.  Given her worsening shock and inability of maximal medical management to correct metabolic deficits, she was made comfort care and was compassionately extubated.  She passed on 14-Jun-2023 at 3:15 pm.     Pertinent Labs and Studies  Significant Diagnostic Studies DG Abd 1 View  Result Date: 2020/06/10 CLINICAL DATA:  NG tube placement EXAM: ABDOMEN - 1 VIEW COMPARISON:  CT 04/19/2020, 03/31/2020 FINDINGS: Interval postsurgical changes of the abdomen including left abdominal cutaneous staples and left upper quadrant drainage catheters. Partially visualized vascular catheter at the low right atrium. Atelectasis at the left base. Esophageal tube tip projects  over the left lower abdomen. Linear opacity in the left upper quadrant with oblique right to left orientation presumably a stent. IMPRESSION: Esophageal tube tip projects over the left lower abdomen, presumably within small bowel limb in this patient with recent history of pancreatico duodenectomy. Electronically Signed   By: Donavan Foil M.D.   On: 06/01/2020 17:25   DG CHEST PORT 1 VIEW  Result Date: 05/20/2020 CLINICAL DATA:   Hemodialysis catheter placement. Recent Whipple procedure. EXAM: PORTABLE CHEST 1 VIEW COMPARISON:  04/19/2020 CT FINDINGS: An endotracheal tube with tip 2 cm above the carina, LEFT IJ hemodialysis catheter with tips overlying the RIGHT atrium, NG tube entering the stomach, and RIGHT Port-A-Cath with tip overlying the RIGHT atrium noted. There is no evidence of pneumothorax. LEFT LOWER lung opacity/atelectasis noted. A tube/drain overlying the UPPER abdomen is noted. IMPRESSION: 1. LEFT IJ hemodialysis catheter tips overlying the RIGHT atrium. No pneumothorax. Other support apparatus as described. 2. LEFT LOWER lung opacity/atelectasis. Electronically Signed   By: Margarette Canada M.D.   On: 05/20/2020 12:23   US LIVER DOPPLER  Result Date: 05-27-2020 CLINICAL DATA:  Pancreatic cancer with biliary stent in place. EXAM: DUPLEX ULTRASOUND OF LIVER TECHNIQUE: Color and duplex Doppler ultrasound was performed to evaluate the hepatic in-flow and out-flow vessels. COMPARISON:  None. FINDINGS: Liver: The liver is extremely heterogeneous in appearance. This is likely partially due to pneumobilia from the patient's known biliary ductal stent. The liver is also echogenic. No discrete mass. No intra hepatic biliary ductal dilatation. Main Portal Vein size: 0.9 cm Portal Vein Velocities Main Prox: Occluded Main Mid: Occluded Main Dist: Occluded Right: 29 cm/sec Left: Occluded Hepatic Vein Velocities Right:  60 cm/sec Middle:  31 cm/sec Left:  28 cm/sec IVC: Present and patent with normal respiratory phasicity. Hepatic Artery Velocity:  63 cm/sec Splenic Vein Velocity: Occluded Spleen: 10.2 cm x 5.3 cm x 11.9 cm with a total volume of 340 cm^3 (411 cm^3 is upper limit normal) Portal Vein Occlusion/Thrombus: Positive. Splenic Vein Occlusion/Thrombus: Positive. Ascites: Small volume ascites. Varices: None visualized. IMPRESSION: 1. Positive for splenic and portal venous thrombosis. No flow is identified within the main portal vein,  left portal vein and splenic vein. There may be trace collateral flow within the right portal veins. 2. Extremely heterogeneous appearance of the hepatic parenchyma likely due in part to pneumobilia given the patient's known biliary stent. 3. Small volume ascites. These results were called by telephone at the time of interpretation on 05-27-2020 at 8:51 am to provider Stark Klein , who verbally acknowledged these results. Electronically Signed   By: Jacqulynn Cadet M.D.   On: 05-27-20 08:52   CT HEAD CODE STROKE WO CONTRAST  Result Date: 05/20/2020 CLINICAL DATA:  Code stroke.  Decreased responsiveness EXAM: CT HEAD WITHOUT CONTRAST TECHNIQUE: Contiguous axial images were obtained from the base of the skull through the vertex without intravenous contrast. COMPARISON:  None. FINDINGS: Brain: No evidence of acute infarction, hemorrhage, hydrocephalus, extra-axial collection or mass lesion/mass effect. Low-density below the right basal ganglia attributed to dilated perivascular space. Vascular: No hyperdense vessel or unexpected calcification. Skull: Normal. Negative for fracture or focal lesion. Sinuses/Orbits: No acute finding. Other: These results were communicated to Dr. Leonel Ramsay at 5:31 amon 9/3/2021by text page via the [facility] messaging system. ASPECTS [facility] Stroke Program Early CT Score) Not scored in this clinical setting IMPRESSION: Negative head CT. Electronically Signed   By: Monte Fantasia M.D.   On: 05/20/2020 05:32    Microbiology No  results found for this or any previous visit (from the past 240 hour(s)).  Lab Basic Metabolic Panel: No results for input(s): NA, K, CL, CO2, GLUCOSE, BUN, CREATININE, CALCIUM, MG, PHOS in the last 168 hours. Liver Function Tests: No results for input(s): AST, ALT, ALKPHOS, BILITOT, PROT, ALBUMIN in the last 168 hours. No results for input(s): LIPASE, AMYLASE in the last 168 hours. No results for input(s): AMMONIA in the last 168 hours. CBC: No  results for input(s): WBC, NEUTROABS, HGB, HCT, MCV, PLT in the last 168 hours. Cardiac Enzymes: No results for input(s): CKTOTAL, CKMB, CKMBINDEX, TROPONINI in the last 168 hours. Sepsis Labs: No results for input(s): PROCALCITON, WBC, LATICACIDVEN in the last 168 hours.  Procedures/Operations  05/20/2020 diagnostic laparoscopy and whipple, blood transfusion 05/20/2020 CRRT, reintubation, central line placement 05/20/2020 liver duplex    Stark Klein 05/31/2020, 1:02 PM

## 2020-06-17 NOTE — Progress Notes (Signed)
eLink Physician-Brief Progress Note Patient Name: Mary [name] DOB: 1951-09-11 MRN: [id number]   Date of Service  14-Jun-2020  HPI/Events of Note  Patient with hypoglycemia s/p D 50 I/2 amp iv push.  eICU Interventions  Nursing communication sent to bedside RN instructing him to re-check blood sugar in 1 hour, and if < 100 mg / dl to start a D5 W infusion at 75 ml / hour.        Kerry Kass Jerre Vandrunen 2020-06-14, 4:17 AM

## 2020-06-17 NOTE — Progress Notes (Signed)
Pt compassionately extubated per MD order. Thank you for allowing Respiratory Therapy to be involved with patient's care.

## 2020-06-17 NOTE — Progress Notes (Signed)
[name]   Subjective:   Brief history:is a 69 y.o. female.  With baseline serum creatinine appears to be within normal range.  She has a history of cardiac disease with an ASD at age 80..  She presented for surgery for pancreatic cancer and underwent a Whipple's procedure 05/28/2020.  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  Postprocedure she had decreased level of consciousness and code stroke was activated 05/20/2020.  Her labs revealed severe acidosis.  She has very minimal urine output and has been aggressively volume resuscitated since 06/15/2020 with about 8 L.  Noted hypotension as low as 80 mmHg systolic 02/17/7857.  CRRT was initiated 05/20/2020.  Blood pressure 92/51 pulse 102 temperature 91 O2 sats 100% FiO2 30%.  Anorectic being kept even on CRRT  IV norepinephrine IV albumin 12.5 g every 6 hours IV cefepime 2 g every 12 hours  Sodium 136 potassium 4.1 chloride 101 CO2 21 BUN 5 creatinine 0.9 calcium 7.9 phosphorus 3.2 magnesium 2.3 AST 1844 ALT 1460 hemoglobin 12.1 WBC 32.3       Objective:  Vital signs in last 24 hours:  Temp:  [91.8 F (33.2 C)-95 F (35 C)] 91.8 F (33.2 C) (09/05 0700) Pulse Rate:  [32-107] 34 (09/05 0415) Resp:  [20-24] 22 (09/05 0700) BP: (57-102)/(35-74) 84/42 (09/05 0700) SpO2:  [97 %-100 %] 97 % (09/05 0330) Arterial Line BP: (87-137)/(39-65) 97/51 (09/05 0700) FiO2 (%):  [30 %-40 %] 30 % (09/05 0145) Weight:  [69.2 kg] 69.2 kg (09/05 0500)  Weight change: 3.8 kg Filed Weights   06/16/2020 0618 05/21/20 0500 2020-06-14 0500  Weight: 57.7 kg 65.4 kg 69.2 kg    Intake/Output: I/O last 3 completed shifts: In: 3482.6 [I.V.:2045.1; NG/GT:90; IV Piggyback:1347.5] Out: 3550 [Urine:82; Emesis/NG output:155; Drains:1270; Other:2043]   Intake/Output this shift:  No intake/output data recorded.  General: Critically ill elderly patient HEENT: Normocephalic atraumatic mucous membranes  moist reactive Eyes: Pupils round equal reactive no icterus or pallor Neck: Supple no thyromegaly adenopathy Heart: Regular rate and rhythm tachycardic Lungs: Clear to auscultation tachypneic Abdomen: Soft appropriate tenderness mildly distended.  Hypoactive bowel sounds Extremities: No cyanosis clubbing or edema Skin: Mild mottling of skin pallor Neuro: Altered mental status   Basic Metabolic Panel: Recent Labs  Lab 05/20/20 0513 05/20/20 0602 05/20/20 1600 05/20/20 1709 05/20/20 2232 05/21/20 0457 05/21/20 0500 05/21/20 0500 05/21/20 0632 05/21/20 1510 06-14-2020 0158 2020/06/14 0500  NA 141   < > 140  138   < > 139   < > 138  --  136 137 138 136  K 4.9   < > 5.0  5.0   < > 4.5   < > 4.3  --  3.8 4.9 4.1 4.1  CL 106   < > 100  99   < > 97*  --  94*  --  93* 93*  --  101  CO2 <7*   < > 15*  15*   < > 24  --  25  --  27 30  --  21*  GLUCOSE 336*   < > 180*  178*   < > 150*  --  177*  --  140* 71  --  105*  BUN 8   < > 7*  8   < > 5*  --  <5*  --  <5* <5*  --  <5*  CREATININE 2.30*   < > 1.91*  1.97*   < >  1.48*  --  1.33*  --  1.28* 1.12*  --  0.90  CALCIUM 7.8*   < > 8.2*  8.2*   < > 7.7*  --  7.8*   < > 7.6* 7.9*  --  7.9*  MG 1.8  --   --   --   --   --   --   --  2.0  --   --  2.3  PHOS 8.3*  --  4.1  --   --   --  2.7  --   --  3.0  --  3.2   < > = values in this interval not displayed.    Liver Function Tests: Recent Labs  Lab 05/20/20 1600 05/20/20 1600 05/20/20 2232 05/21/20 0500 05/21/20 0632 05/21/20 1510 05/28/2020 0500  AST 1,969*  --  2,122* 1,896* 1,892*  --  1,844*  ALT 940*  --  973* 1,004* 1,023*  --  1,460*  ALKPHOS 246*  --  255* 263* 253*  --  456*  BILITOT 8.2*  --  8.0* 8.5* 8.5*  --  10.3*  PROT 5.2*  --  5.0* 5.1* 5.3*  --  4.8*  ALBUMIN 3.6  3.5   < > 3.4* 4.0 3.9 3.8 3.4*  3.4*   < > = values in this interval not displayed.   Recent Labs  Lab 05/16/20 1315  LIPASE 31   Recent Labs  Lab 05/20/20 0907  AMMONIA 88*     CBC: Recent Labs  Lab 05/16/20 1315 06/06/2020 0859 05/20/20 0513 05/20/20 0602 05/20/20 1014 05/20/20 1015 05/20/20 1152 05/20/20 1709 05/21/20 0457 05/21/20 0632 May 28, 2020 0158 2020/05/28 0500  WBC 5.4  --  25.9*  --   --  25.3*  --   --   --  17.8*  --  32.3*  NEUTROABS 3.1  --   --   --   --   --   --   --   --   --   --   --   HGB 11.0*   < > 11.9*   < >  --  12.6   < > 10.9* 10.2* 10.7* 11.2* 12.1  HCT 34.4*   < > 39.1   < >  --  40.1   < > 32.0* 30.0* 31.5* 33.0* 36.7  MCV 102.7*  --  99.2  --   --  99.3  --   --   --  92.9  --  95.1  PLT 356  --  325  --  330 315  --   --   --  122*  --  159   < > = values in this interval not displayed.    Cardiac Enzymes: No results for input(s): CKTOTAL, CKMB, CKMBINDEX, TROPONINI in the last 168 hours.  BNP: Invalid input(s): POCBNP  CBG: Recent Labs  Lab 05/21/20 1948 05/21/20 2326 05-28-20 0323 May 28, 2020 0347 05/28/20 0521  GLUCAP 100* 71 78* 145* 95    Microbiology: Results for orders placed or performed during the hospital encounter of 05/26/2020  MRSA PCR Screening     Status: None   Collection Time: 06/05/2020  7:36 PM   Specimen: Nasopharyngeal  Result Value Ref Range Status   MRSA by PCR NEGATIVE NEGATIVE Final    Comment:        The GeneXpert MRSA Assay (FDA approved for NASAL specimens only), is one component of a comprehensive MRSA colonization surveillance program. It is not intended to diagnose MRSA  infection nor to guide or monitor treatment for MRSA infections. Performed at [facility], [place] [address] [place], [place] [address]   Culture, blood (Routine X 2) w Reflex to ID Panel     Status: None (Preliminary result)   Collection Time: 05/20/20  5:49 PM   Specimen: BLOOD LEFT HAND  Result Value Ref Range Status   Specimen Description BLOOD LEFT HAND  Final   Special Requests   Final    BOTTLES DRAWN AEROBIC AND ANAEROBIC Blood Culture results may not be optimal due to an inadequate volume  of blood received in culture bottles   Culture   Final    NO GROWTH 2 DAYS Performed at [facility], [place] [address] [place], [place] [address]    Report Status PENDING  Incomplete  Culture, blood (Routine X 2) w Reflex to ID Panel     Status: None (Preliminary result)   Collection Time: 05/20/20  5:56 PM   Specimen: BLOOD LEFT ARM  Result Value Ref Range Status   Specimen Description BLOOD LEFT ARM  Final   Special Requests   Final    BOTTLES DRAWN AEROBIC AND ANAEROBIC Blood Culture adequate volume   Culture   Final    NO GROWTH 2 DAYS Performed at [facility], [place] [address], [place], [place] [address]    Report Status PENDING  Incomplete    Coagulation Studies: Recent Labs    05/20/20 0513 05/20/20 1014  LABPROT 33.7* 34.4*  INR 3.5* 3.6*    Urinalysis: Recent Labs    05/20/20 1013  COLORURINE AMBER*  LABSPEC 1.012  PHURINE 5.0  GLUCOSEU >=500*  HGBUR MODERATE*  BILIRUBINUR NEGATIVE  KETONESUR NEGATIVE  PROTEINUR 100*  NITRITE NEGATIVE  LEUKOCYTESUR SMALL*      Imaging: DG CHEST PORT 1 VIEW  Result Date: 05/20/2020 CLINICAL DATA:  Hemodialysis catheter placement. Recent Whipple procedure. EXAM: PORTABLE CHEST 1 VIEW COMPARISON:  04/19/2020 CT FINDINGS: An endotracheal tube with tip 2 cm above the carina, LEFT IJ hemodialysis catheter with tips overlying the RIGHT atrium, NG tube entering the stomach, and RIGHT Port-A-Cath with tip overlying the RIGHT atrium noted. There is no evidence of pneumothorax. LEFT LOWER lung opacity/atelectasis noted. A tube/drain overlying the UPPER abdomen is noted. IMPRESSION: 1. LEFT IJ hemodialysis catheter tips overlying the RIGHT atrium. No pneumothorax. Other support apparatus as described. 2. LEFT LOWER lung opacity/atelectasis. Electronically Signed   By: Margarette Canada M.D.   On: 05/20/2020 12:23     Medications:   .  prismasol BGK 4/2.5 400 mL/hr at 06-14-20 0224  .  prismasol BGK 4/2.5 200 mL/hr at 05/21/20  1407  . sodium chloride    . sodium chloride    . ceFEPime (MAXIPIME) IV Stopped (05/21/20 2219)  . dextrose 5% lactated ringers 75 mL/hr at 06/14/20 0700  . fentaNYL infusion INTRAVENOUS Stopped (2020/06/14 0150)  . metronidazole Stopped (2020-06-14 0552)  . norepinephrine (LEVOPHED) Adult infusion 37 mcg/min (Jun 14, 2020 0700)  . prismasol BGK 4/2.5 2,000 mL/hr at 06/14/2020 0412   . chlorhexidine  15 mL Mouth Rinse BID  . Chlorhexidine Gluconate Cloth  6 each Topical Daily  . dextrose  25 mL Intravenous Once  . dextrose      . insulin aspart  0-9 Units Subcutaneous Q4H  . insulin detemir  5 Units Subcutaneous Q24H  . lactulose  10 g Per Tube TID  . mouth rinse  15 mL Mouth Rinse 10 times per day  .  pantoprazole (PROTONIX) IV  40 mg Intravenous Daily  . pneumococcal 23 valent vaccine  0.5 mL Intramuscular Tomorrow-1000  . ursodiol  300 mg Per NG tube Daily   Place/Maintain arterial line **AND** sodium chloride, Place/Maintain arterial line **AND** sodium chloride, fentaNYL (SUBLIMAZE) injection, fentaNYL (SUBLIMAZE) injection, heparin, sodium chloride  Assessment/ Plan:  1.Acute kidney injury status post Whipple procedure with shock and hypotension postoperatively.  Appears the patient will require renal replacement therapy is anuric after massive volume resuscitation.  Avoid nephrotoxins.  It appears the patient was on ARB as an outpatient.  CRRT initiated 05/21/2019.  Kept even.  No anticoagulation.  All 4-2.5 solutions 2. Hypertension/volume  -keep even at this time 3.  Metabolic acidosis this is improved with CRRT.    4.  Shock has responded to massive volume resuscitation.  Possible hemorrhagic versus septic.  Patient may also have had an episode of bowel ischemia leading to shock.  This needs to be borne in mind in the differential diagnosis. 5.  Status post pancreatic cancer status post Whipple's procedure 06/11/2020    LOS: 3 Mary [name] @TODAY @7 :31 AM

## 2020-06-17 NOTE — Progress Notes (Signed)
PT Cancellation Note  Patient Details Name: Mary [name] MRN: [id number] DOB: June 05, 1951   Cancelled Treatment:    Reason Eval/Treat Not Completed: Patient not medically ready. Pt remains intubated and on CRRT. Not appropriate at this time to mobilize. PT to return as able, as appropriate to complete PT eval.  Kittie Plater, PT, DPT Acute Rehabilitation Services Pager #: [phone] Office #: [phone]    Berline Lopes 2020/06/04, 8:05 AM

## 2020-06-17 NOTE — Progress Notes (Signed)
Hypoglycemic Event  CBG: 57  Treatment: 25 ml of D50  Symptoms: none  Follow-up CBG: Time:0345 CBG Result:145  Possible Reasons for Event: no oral intake, (shock liver).   Comments/MD notified: Called E Link.     Paulina Fusi

## 2020-06-17 DEATH — deceased

## 2022-01-10 IMAGING — CT CT ABD-PELV W/ CM
2 of 9 series · 13 of 46 positions shown, 15 images · IV contrast (OMNIPAQUE)
Comparison: Outside noncontrast CT abdomen from 12/11/2019 from
[REDACTED].

CLINICAL DATA: Pancreatic adenocarcinoma staging workup.

EXAM:
CT ABDOMEN AND PELVIS WITH CONTRAST
TECHNIQUE: Multidetector CT imaging of the abdomen and pelvis was performed
using the standard protocol following bolus administration of
intravenous contrast.
CONTRAST:  100mL OMNIPAQUE IOHEXOL 300 MG/ML  SOLN

[Series 3: coronal arterial · coronal · arterial · 0.45mm/px · 3 of 72 slices shown]
[im 18/72  soft-tissue]
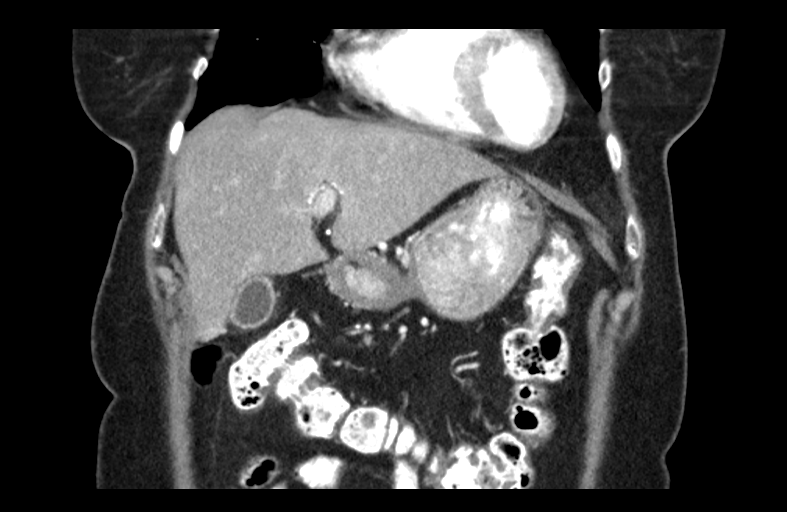
[im 36/72  soft-tissue]
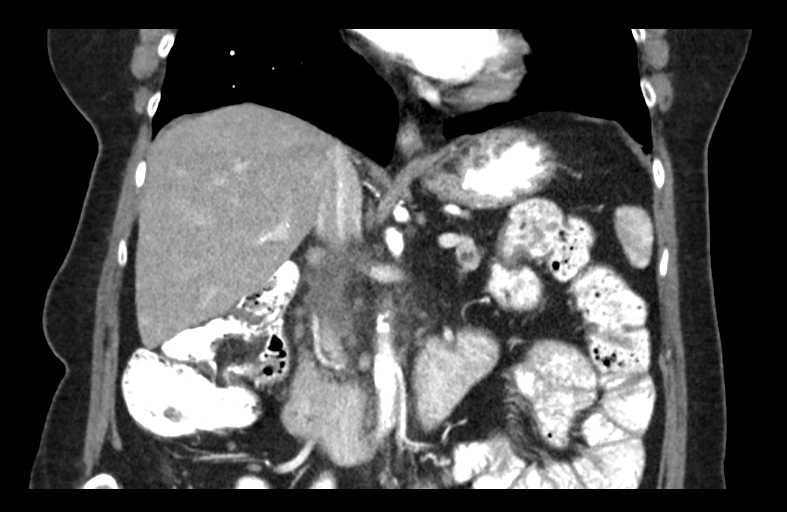
[im 54/72  soft-tissue]
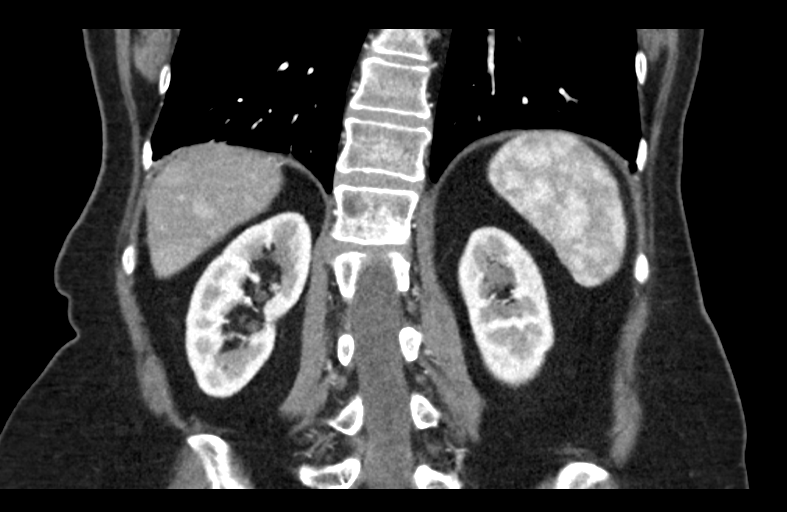

[Series 7: axial venous · axial · portal-venous · 0.75mm/px · z∈[-211,+119]mm · 10 of 134 slices shown, 12 images]
[im 12/134  soft-tissue]
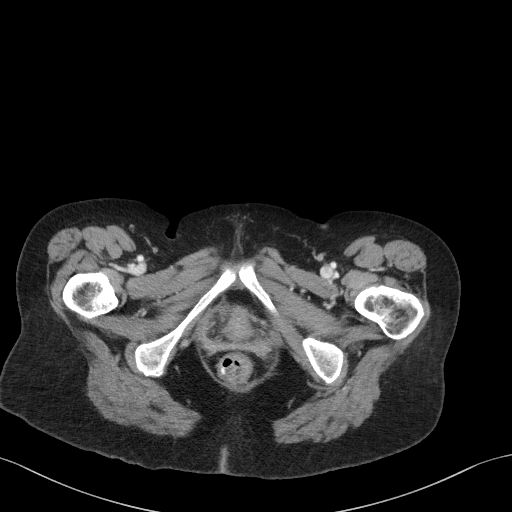
[im 12/134  bone]
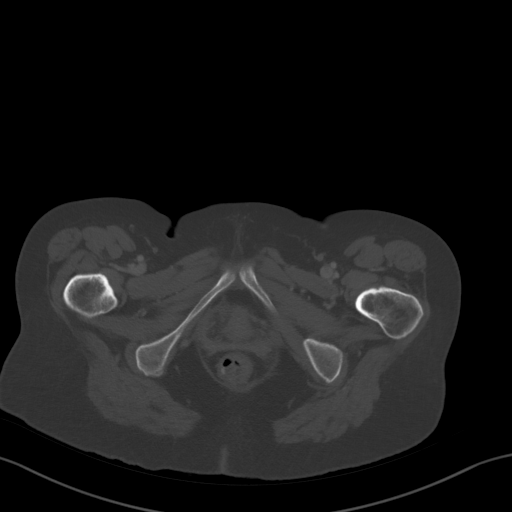
[im 23/134  soft-tissue]
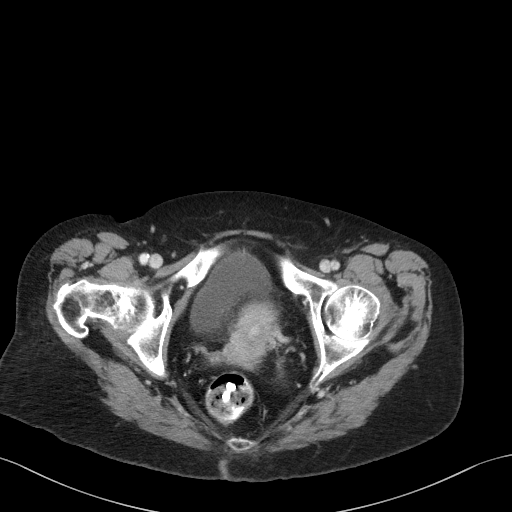
[im 34/134  soft-tissue]
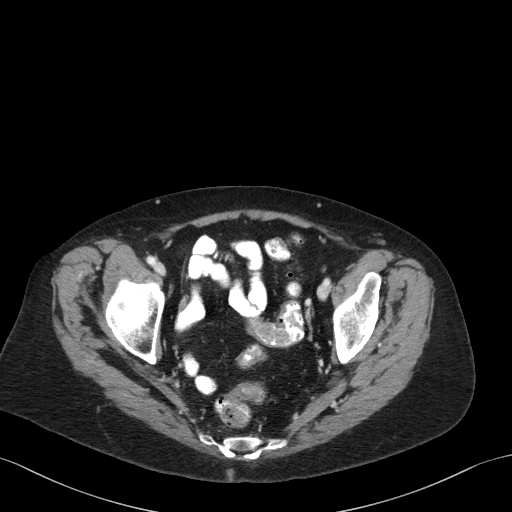
[im 45/134  soft-tissue]
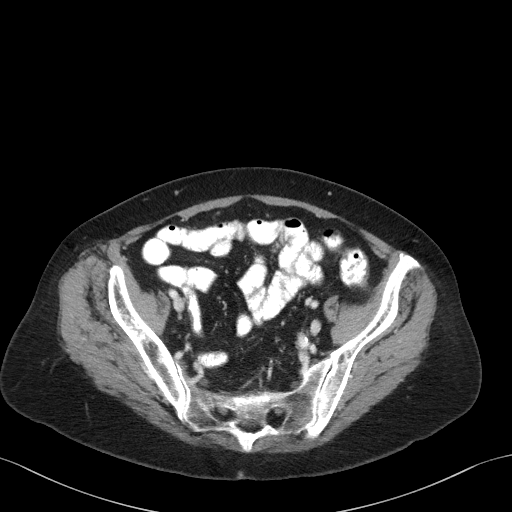
[im 56/134  soft-tissue]
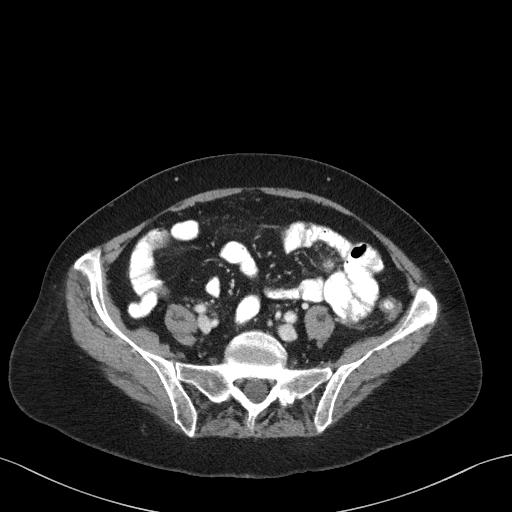
[im 78/134  soft-tissue]
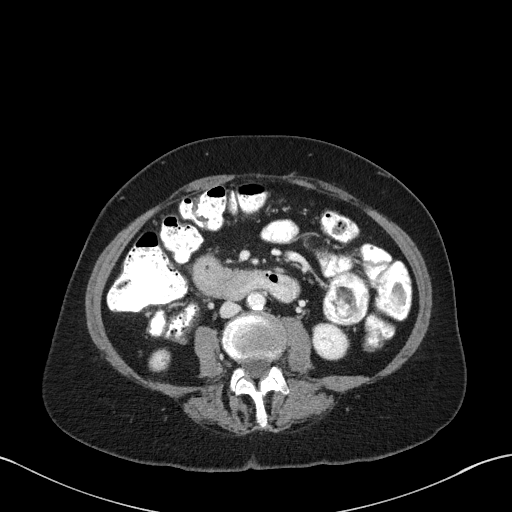
[im 89/134  soft-tissue]
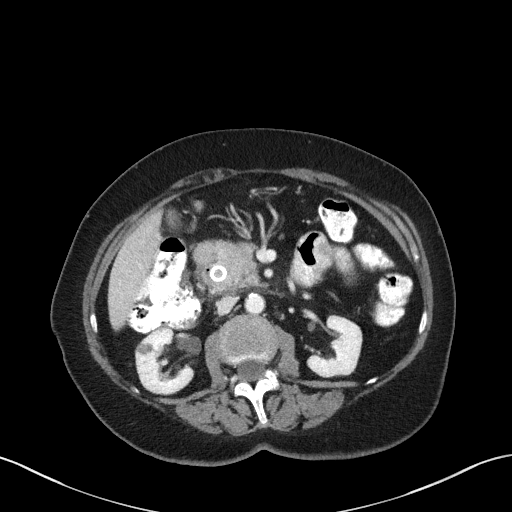
[im 100/134  soft-tissue]
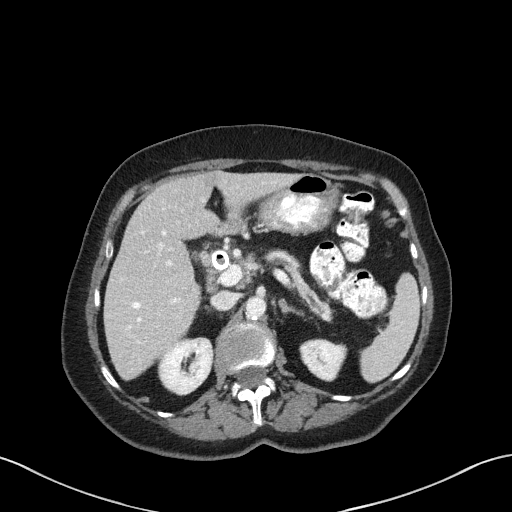
[im 111/134  soft-tissue]
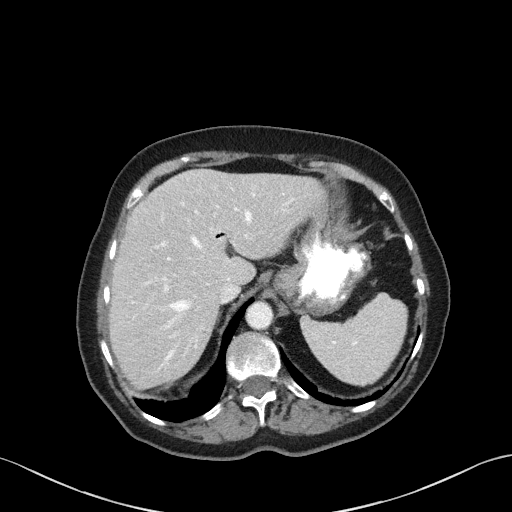
[im 111/134  bone]
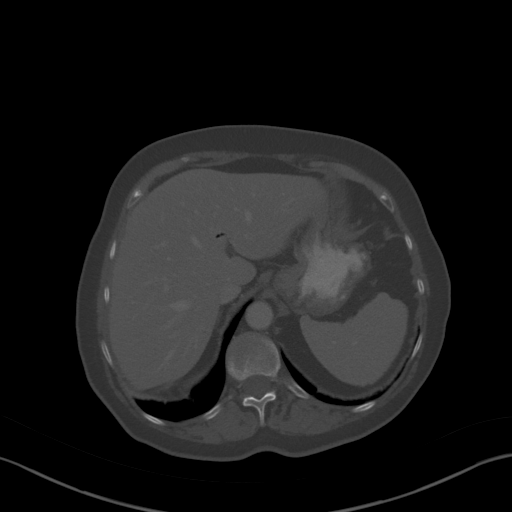
[im 122/134  soft-tissue]
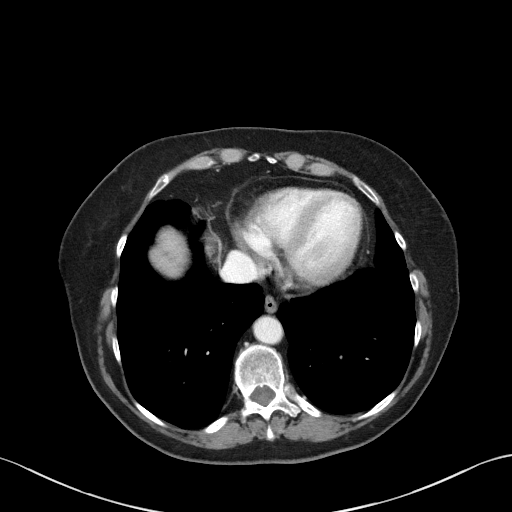

[13 of 46 positions shown; findings below may reference images not displayed]

FINDINGS: Lower chest: Cylindrical bronchiectasis in both lower lobes. Mild
scarring along the lower margin of the left major fissure.
Subsegmental atelectasis or scarring in the posterior basal segments
of both lower lobes. Descending thoracic aortic atherosclerotic
calcification.

Hepatobiliary: 0.9 by 0.5 cm lesion in segment 8 of the liver on
image [DATE], technically nonspecific due to small size. Expandable
biliary stent extends from the common hepatic duct into the margin
of the duodenum. Small amount of pneumobilia but no current biliary
dilatation. Upper normal thickness of the gallbladder.

Pancreas: A hypoenhancing mass in the pancreas highly suspicious for
adenocarcinoma is present with the following characteristics:

Size: 3.1 by 2.5 by 1.9 cm

Location: Pancreatic body and head

Characterization: Solid

Enhancement: Diffuse hypoenhancement on arterial phase images, with
a more heterogeneous appearance on delayed images

Other Characteristics: Mild to moderate dilatation of the dorsal
pancreatic duct in the pancreatic tail.

Local extent of mass: The mass abuts the posterior margin of the
duodenal bulb on image 41/2.

Vascular Involvement: The mass extends along an approximately 200
degree arc of the anterior margin of the distal portal vein with
some narrowing of the portal vein shown on images 38-39 of series 7
in the vicinity of the mass. The tumor appears to envelop the right
gastric artery for example on images 38 through 44 of series 2.

Variant hepatic artery anatomy: No

Bile Duct Involvement: The mass abuts and approximately 90 degree
arc of the biliary stent.

Variant biliary anatomy: Not observed

Adjacent Nodes: Right eccentric porta hepatis node 1.1 cm in short
axis on image 36/2. Peripancreatic node 0.6 cm in short axis on
image [DATE].

Omental/Peritoneal Disease: Not observed

Distant Metastases: Indeterminate small liver lesion may well be
benign in segment 8 but is technically nonspecific.

Other: Low-level edema around the pancreatic head.

Spleen: Unremarkable

Adrenals/Urinary Tract: Right mid kidney cyst 1.2 by 1.0 cm on image
48/7. Otherwise unremarkable.

Stomach/Bowel: Short focus of proximal jejuno-jejunal
intussusception about 1.0 cm in length on image 51/8. Mildly dilated
proximal jejunum with semi solid contents. Mildly mobile cecum in
the right upper quadrant.

Vascular/Lymphatic: Aortoiliac atherosclerotic vascular disease.

Reproductive: Bilateral tubal ligation clips noted. Otherwise
unremarkable.

Other: No supplemental non-categorized findings.

Musculoskeletal: Degenerative disc disease and mild degenerative
endplate findings at L4-5 especially eccentric to the right. Mild
dextroconvex lumbar scoliosis.
IMPRESSION: 1. 3.1 cm hypoenhancing mass in the pancreas body highly suspicious
for adenocarcinoma. The mass extends along an approximately 200
degree arc of the anterior margin of the distal portal vein with
some narrowing of the portal vein. The mass envelops the right
gastric artery.
2. Mildly enlarged porta hepatis lymph node, nonspecific due to
local extent of the mass.
3. Small liver lesion in segment 8 is technically nonspecific due to
small size.
4. Short focus of proximal jejuno-jejunal intussusception about
cm in length.
5. Cylindrical bronchiectasis in both lower lobes.

Aortic Atherosclerosis ([id number]).
